# Patient Record
Sex: Female | Born: 1955 | Race: White | Hispanic: No | Marital: Married | Smoking: Never smoker
Health system: Southern US, Community
[De-identification: ages and names within clinical notes are randomized; demographics above are authoritative.]

## PROBLEM LIST (undated history)

## (undated) DIAGNOSIS — I10 Essential (primary) hypertension: Secondary | ICD-10-CM

## (undated) DIAGNOSIS — C801 Malignant (primary) neoplasm, unspecified: Secondary | ICD-10-CM

## (undated) DIAGNOSIS — Z87442 Personal history of urinary calculi: Secondary | ICD-10-CM

## (undated) DIAGNOSIS — G629 Polyneuropathy, unspecified: Secondary | ICD-10-CM

## (undated) DIAGNOSIS — K6289 Other specified diseases of anus and rectum: Secondary | ICD-10-CM

## (undated) HISTORY — DX: Essential (primary) hypertension: I10

## (undated) HISTORY — DX: Other specified diseases of anus and rectum: K62.89

## (undated) HISTORY — PX: OTHER SURGICAL HISTORY: SHX169

## (undated) HISTORY — PX: TUBAL LIGATION: SHX77

## (undated) HISTORY — PX: TONSILLECTOMY: SUR1361

## (undated) HISTORY — PX: THYROIDECTOMY: SHX17

---

## 2018-05-18 ENCOUNTER — Telehealth (INDEPENDENT_AMBULATORY_CARE_PROVIDER_SITE_OTHER): Payer: Self-pay | Admitting: *Deleted

## 2018-05-18 ENCOUNTER — Encounter (INDEPENDENT_AMBULATORY_CARE_PROVIDER_SITE_OTHER): Payer: Self-pay | Admitting: Internal Medicine

## 2018-05-18 ENCOUNTER — Encounter (INDEPENDENT_AMBULATORY_CARE_PROVIDER_SITE_OTHER): Payer: Self-pay | Admitting: *Deleted

## 2018-05-18 ENCOUNTER — Ambulatory Visit (INDEPENDENT_AMBULATORY_CARE_PROVIDER_SITE_OTHER): Payer: BLUE CROSS/BLUE SHIELD | Admitting: Internal Medicine

## 2018-05-18 VITALS — BP 150/78 | HR 80 | Temp 98.5°F | Ht 61.0 in | Wt 121.3 lb

## 2018-05-18 DIAGNOSIS — K6289 Other specified diseases of anus and rectum: Secondary | ICD-10-CM

## 2018-05-18 DIAGNOSIS — K625 Hemorrhage of anus and rectum: Secondary | ICD-10-CM | POA: Diagnosis not present

## 2018-05-18 DIAGNOSIS — K629 Disease of anus and rectum, unspecified: Secondary | ICD-10-CM

## 2018-05-18 HISTORY — DX: Other specified diseases of anus and rectum: K62.89

## 2018-05-18 LAB — CBC WITH DIFFERENTIAL/PLATELET
BASOS ABS: 80 {cells}/uL (ref 0–200)
BASOS PCT: 1 %
EOS PCT: 2 %
Eosinophils Absolute: 160 cells/uL (ref 15–500)
HEMATOCRIT: 39.1 % (ref 35.0–45.0)
HEMOGLOBIN: 13.3 g/dL (ref 11.7–15.5)
LYMPHS ABS: 2384 {cells}/uL (ref 850–3900)
MCH: 29.4 pg (ref 27.0–33.0)
MCHC: 34 g/dL (ref 32.0–36.0)
MCV: 86.3 fL (ref 80.0–100.0)
MPV: 10.8 fL (ref 7.5–12.5)
Monocytes Relative: 7.3 %
NEUTROS ABS: 4792 {cells}/uL (ref 1500–7800)
Neutrophils Relative %: 59.9 %
Platelets: 303 10*3/uL (ref 140–400)
RBC: 4.53 10*6/uL (ref 3.80–5.10)
RDW: 12.1 % (ref 11.0–15.0)
Total Lymphocyte: 29.8 %
WBC mixed population: 584 cells/uL (ref 200–950)
WBC: 8 10*3/uL (ref 3.8–10.8)

## 2018-05-18 LAB — COMPREHENSIVE METABOLIC PANEL
AG RATIO: 1.9 (calc) (ref 1.0–2.5)
ALBUMIN MSPROF: 4.6 g/dL (ref 3.6–5.1)
ALT: 17 U/L (ref 6–29)
AST: 23 U/L (ref 10–35)
Alkaline phosphatase (APISO): 79 U/L (ref 33–130)
BILIRUBIN TOTAL: 0.6 mg/dL (ref 0.2–1.2)
BUN: 12 mg/dL (ref 7–25)
CHLORIDE: 101 mmol/L (ref 98–110)
CO2: 33 mmol/L — AB (ref 20–32)
Calcium: 9.8 mg/dL (ref 8.6–10.4)
Creat: 0.95 mg/dL (ref 0.50–0.99)
GLOBULIN: 2.4 g/dL (ref 1.9–3.7)
Glucose, Bld: 111 mg/dL (ref 65–139)
POTASSIUM: 3.7 mmol/L (ref 3.5–5.3)
SODIUM: 141 mmol/L (ref 135–146)
TOTAL PROTEIN: 7 g/dL (ref 6.1–8.1)

## 2018-05-18 MED ORDER — PEG 3350-KCL-NA BICARB-NACL 420 G PO SOLR: 4000.0000 mL | Freq: Once | ORAL | 0 refills | Status: AC

## 2018-05-18 NOTE — Patient Instructions (Signed)
The risks of bleeding, perforation and infection were reviewed with patient.  

## 2018-05-18 NOTE — Progress Notes (Addendum)
   Subjective:    Patient ID: Margaret Castaneda, female    DOB: Jul 26, 1956, 62 y.o.   MRN: [id number]  HPI Referred by  Dr. Hoyt Koch for rectal mass/rectal bleeding. Seen in her OV Monday with c/o rectal bleeding and rectal pain. On rectal exam she was noted to have a rectal mass. No bleeding since Friday night. She says the bleeding was massive 5 days ago. Hx of Thrombosed hemorrhoids 5 yrs ago and this was clipped. Grandfather had ? Stomach or colon cancer in his 32s. No NSAIDs. She has had symptoms for about a month. Rectal pain.  Using the Anusol cream  Her last colonoscopy was in August of 2018 for screening. Normal.  Cecum well visualized. No polypoid disease, colitis, or sigmoid diverticulosis. No NSAIDs.   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  Review of Systems Past Medical History:  Diagnosis Date  . Hypertension   . Rectal mass 05/18/2018    History reviewed. No pertinent surgical history.  No Known Allergies  Current Outpatient Medications on File Prior to Visit  Medication Sig Dispense Refill  . amLODipine (NORVASC) 2.5 MG tablet Take 2.5 mg by mouth. Three times a week    . benazepril (LOTENSIN) 10 MG tablet Take 10 mg by mouth daily.    . hydrochlorothiazide (MICROZIDE) 12.5 MG capsule Take 12.5 mg by mouth daily.     No current facility-administered medications on file prior to visit.         Objective:   Physical Exam Blood pressure (!) 150/78, pulse 80, temperature 98.5 F (36.9 C), height 5\' 1"  (1.549 m), weight 121 lb 4.8 oz (55 kg).  Alert and oriented. Skin warm and dry. Oral mucosa is moist.   . Sclera anicteric, conjunctivae is pink. Thyroid not enlarged. No cervical lymphadenopathy. Lungs clear. Heart rate slightly irregular.  Abdomen is soft. Bowel sounds are positive. No hepatomegaly. No abdominal masses felt. No tenderness.  No edema to lower extremities.  Rectal exam: hard small mass felt 9 'oclock. Guaiac positive.        Assessment & Plan:  Rectal mass, Guaiac positive. Colonic neoplasm needs to be ruled out. Colonic polyp, hemorrhoid also in the differential.

## 2018-05-18 NOTE — Telephone Encounter (Signed)
agree

## 2018-05-24 ENCOUNTER — Other Ambulatory Visit: Payer: Self-pay

## 2018-05-24 ENCOUNTER — Encounter (HOSPITAL_COMMUNITY)
Admission: RE | Disposition: A | Discharge status: Home / Self Care | Payer: Self-pay | Source: Ambulatory Visit | Attending: Internal Medicine

## 2018-05-24 ENCOUNTER — Encounter (HOSPITAL_COMMUNITY): Payer: Self-pay | Admitting: *Deleted

## 2018-05-24 ENCOUNTER — Ambulatory Visit (HOSPITAL_COMMUNITY)
Admission: RE | Admit: 2018-05-24 | Discharge: 2018-05-24 | Disposition: A | Discharge status: Home / Self Care | Payer: BLUE CROSS/BLUE SHIELD | Source: Ambulatory Visit | Attending: Internal Medicine | Admitting: Internal Medicine

## 2018-05-24 DIAGNOSIS — K644 Residual hemorrhoidal skin tags: Secondary | ICD-10-CM | POA: Diagnosis not present

## 2018-05-24 DIAGNOSIS — Z79899 Other long term (current) drug therapy: Secondary | ICD-10-CM | POA: Insufficient documentation

## 2018-05-24 DIAGNOSIS — K625 Hemorrhage of anus and rectum: Secondary | ICD-10-CM | POA: Insufficient documentation

## 2018-05-24 DIAGNOSIS — C20 Malignant neoplasm of rectum: Secondary | ICD-10-CM | POA: Diagnosis not present

## 2018-05-24 DIAGNOSIS — Z800 Family history of malignant neoplasm of digestive organs: Secondary | ICD-10-CM | POA: Diagnosis not present

## 2018-05-24 DIAGNOSIS — K6289 Other specified diseases of anus and rectum: Secondary | ICD-10-CM | POA: Diagnosis not present

## 2018-05-24 DIAGNOSIS — I10 Essential (primary) hypertension: Secondary | ICD-10-CM | POA: Diagnosis not present

## 2018-05-24 HISTORY — PX: COLONOSCOPY: SHX5424

## 2018-05-24 SURGERY — COLONOSCOPY
Anesthesia: Moderate Sedation

## 2018-05-24 MED ORDER — MEPERIDINE HCL 50 MG/ML IJ SOLN
INTRAMUSCULAR | Status: DC | PRN
  Administered 2018-05-24 (×2): 25 mg via INTRAVENOUS

## 2018-05-24 MED ORDER — MIDAZOLAM HCL 5 MG/5ML IJ SOLN
INTRAMUSCULAR | Status: DC | PRN
  Administered 2018-05-24 (×2): 1 mg via INTRAVENOUS
  Administered 2018-05-24 (×2): 2 mg via INTRAVENOUS

## 2018-05-24 MED ORDER — SODIUM CHLORIDE 0.9 % IV SOLN
INTRAVENOUS | Status: DC
  Administered 2018-05-24: 07:00:00 via INTRAVENOUS

## 2018-05-24 MED ORDER — MEPERIDINE HCL 50 MG/ML IJ SOLN
INTRAMUSCULAR | Status: AC
  Filled 2018-05-24: qty 1

## 2018-05-24 MED ORDER — ACETAMINOPHEN 500 MG PO TABS: 500.0000 mg | ORAL_TABLET | Freq: Four times a day (QID) | ORAL | 0 refills | Status: AC | PRN

## 2018-05-24 MED ORDER — MIDAZOLAM HCL 5 MG/5ML IJ SOLN
INTRAMUSCULAR | Status: AC
  Filled 2018-05-24: qty 10

## 2018-05-24 NOTE — Discharge Instructions (Signed)
No aspirin or NSAIDs for 24 hours. Resume usual medications and diet as before. No driving for 24 hours. Patient will call with biopsy results and further recommendations.   Colonoscopy, Adult, Care After This sheet gives you information about how to care for yourself after your procedure. Your doctor may also give you more specific instructions. If you have problems or questions, call your doctor. Follow these instructions at home: General instructions   For the first 24 hours after the procedure: ? Do not drive or use machinery. ? Do not sign important documents. ? Do not drink alcohol. ? Do your daily activities more slowly than normal. ? Eat foods that are soft and easy to digest. ? Rest often.  Take over-the-counter or prescription medicines only as told by your doctor.  It is up to you to get the results of your procedure. Ask your doctor, or the department performing the procedure, when your results will be ready. To help cramping and bloating:  Try walking around.  Put heat on your belly (abdomen) as told by your doctor. Use a heat source that your doctor recommends, such as a moist heat pack or a heating pad. ? Put a towel between your skin and the heat source. ? Leave the heat on for 20-30 minutes. ? Remove the heat if your skin turns bright red. This is especially important if you cannot feel pain, heat, or cold. You can get burned. Eating and drinking  Drink enough fluid to keep your pee (urine) clear or pale yellow.  Return to your normal diet as told by your doctor. Avoid heavy or fried foods that are hard to digest.  Avoid drinking alcohol for as long as told by your doctor. Contact a doctor if:  You have blood in your poop (stool) 2-3 days after the procedure. Get help right away if:  You have more than a small amount of blood in your poop.  You see large clumps of tissue (blood clots) in your poop.  Your belly is swollen.  You feel sick to your  stomach (nauseous).  You throw up (vomit).  You have a fever.  You have belly pain that gets worse, and medicine does not help your pain. This information is not intended to replace advice given to you by your health care provider. Make sure you discuss any questions you have with your health care provider. Document Released: 01/16/2011 Document Revised: 09/07/2016 Document Reviewed: 09/07/2016 Elsevier Interactive Patient Education  2017 [employer].

## 2018-05-24 NOTE — Op Note (Signed)
[facility] Patient Name: Margaret Castaneda Procedure Date: 05/24/2018 7:36 AM MRN: [id number] Date of Birth: 1956/11/08 Attending MD: Hildred Laser , MD CSN: [id number] Age: 62 Admit Type: Outpatient Procedure:                Colonoscopy Indications:              Rectal bleeding, Rectal mass Providers:                Hildred Laser, MD, Janeece Riggers, RN, Aram Candela Referring MD:             Hoyt Koch, MD Medicines:                Meperidine 50 mg IV, Midazolam 6 mg IV Complications:            No immediate complications. Estimated Blood Loss:     Estimated blood loss was minimal. Procedure:                Pre-Anesthesia Assessment:                           - Prior to the procedure, a History and Physical                            was performed, and patient medications and                            allergies were reviewed. The patient's tolerance of                            previous anesthesia was also reviewed. The risks                            and benefits of the procedure and the sedation                            options and risks were discussed with the patient.                            All questions were answered, and informed consent                            was obtained. Prior Anticoagulants: The patient has                            taken no previous anticoagulant or antiplatelet                            agents. ASA Grade Assessment: II - A patient with                            mild systemic disease. After reviewing the risks                            and benefits, the patient was deemed in  satisfactory condition to undergo the procedure.                           After obtaining informed consent, the colonoscope                            was passed under direct vision. Throughout the                            procedure, the patient's blood pressure, pulse, and                            oxygen saturations were monitored  continuously. The                            [id number] ([id number]) scope was introduced through                            the anus and advanced to the the cecum, identified                            by appendiceal orifice and ileocecal valve. The                            colonoscopy was somewhat difficult. Successful                            completion of the procedure was aided by changing                            the patient to a supine position and using manual                            pressure. The patient tolerated the procedure well.                            The quality of the bowel preparation was excellent.                            The ileocecal valve, appendiceal orifice, and                            rectum were photographed. Scope In: 8:11:24 AM Scope Out: 8:33:34 AM Scope Withdrawal Time: 0 hours 10 minutes 40 seconds  Total Procedure Duration: 0 hours 22 minutes 10 seconds  Findings:      The perianal examination was normal.      The digital rectal exam revealed a firm and fixed rectal mass palpated       on digital exam. The mass was non-circumferential and located       predominantly at the anterior bowel wall.      The colon (entire examined portion) appeared normal.      A polypoid non-obstructing small mass was found in the distal rectum.       The mass was non-circumferential. The  mass measured two cm in length. In       addition, its diameter measured two cm. No bleeding was present.       Biopsies were taken with a cold forceps for histology. The pathology       specimen was placed into Bottle Number 1.      External hemorrhoids were found during retroflexion. The hemorrhoids       were small. Impression:               - The entire examined colon is normal.                           - Likely malignant tumor in the distal rectum. It                            measures about 2 x 2 cm adjacent to dentate line.                            Biopsied.                            - External hemorrhoids. Moderate Sedation:      Moderate (conscious) sedation was administered by the endoscopy nurse       and supervised by the endoscopist. The following parameters were       monitored: oxygen saturation, heart rate, blood pressure, CO2       capnography and response to care. Total physician intraservice time was       27 minutes. Recommendation:           - Patient has a contact number available for                            emergencies. The signs and symptoms of potential                            delayed complications were discussed with the                            patient. Return to normal activities tomorrow.                            Written discharge instructions were provided to the                            patient.                           - Resume previous diet today.                           - Continue present medications.                           - No aspirin, ibuprofen, naproxen, or other                            non-steroidal anti-inflammatory drugs  for 1 day.                           - Await pathology results.                           - Repeat colonoscopy after studies are complete. Procedure Code(s):        --- Professional ---                           [phone], Colonoscopy, flexible; with biopsy, single                            or multiple                           G0500, Moderate sedation services provided by the                            same physician or other qualified health care                            professional performing a gastrointestinal                            endoscopic service that sedation supports,                            requiring the presence of an independent trained                            observer to assist in the monitoring of the                            patient's level of consciousness and physiological                            status; initial 15 minutes of intra-service  time;                            patient age 21 years or older (additional time may                            be reported with [phone], as appropriate)                           [phone], Moderate sedation services provided by the                            same physician or other qualified health care                            professional performing the diagnostic or  therapeutic service that the sedation supports,                            requiring the presence of an independent trained                            observer to assist in the monitoring of the                            patient's level of consciousness and physiological                            status; each additional 15 minutes intraservice                            time (List separately in addition to code for                            primary service) Diagnosis Code(s):        --- Professional ---                           K62.89, Other specified diseases of anus and rectum                           K64.4, Residual hemorrhoidal skin tags                           D49.0, Neoplasm of unspecified behavior of                            digestive system                           K62.5, Hemorrhage of anus and rectum CPT copyright 2017 American Medical Association. All rights reserved. The codes documented in this report are preliminary and upon coder review may  be revised to meet current compliance requirements. Hildred Laser, MD Hildred Laser, MD 05/24/2018 8:49:59 AM This report has been signed electronically. Number of Addenda: 0

## 2018-05-24 NOTE — H&P (Signed)
Margaret Castaneda is an 62 y.o. female.   Chief Complaint: Patient is here for colonoscopy. HPI: Patient is 62 year old Caucasian female whose last colonoscopy was about 5 years ago and was normal who has noted pressure sensation in her rectum few weeks.  She also had an episode of rectal bleeding one weeks ago.  She was seen by her gynecologist Dr. Caren Griffins Heist last week noted to have rectal mass.  She was therefore referred to our office for further evaluation.  She denies abdominal pain anorexia or weight loss.  She also denies change in her bowel habits. Family history is negative for CRC.  Rectal bleeding  Past Medical History:  Diagnosis Date  . Hypertension   . Rectal mass 05/18/2018    Past Surgical History:  Procedure Laterality Date  . reversal tubal ligation    . TONSILLECTOMY    . TUBAL LIGATION      Family History  Problem Relation Age of Onset  . Gastric cancer Paternal Grandfather   . Colon cancer Neg Hx    Social History:  reports that she has never smoked. She has never used smokeless tobacco. She reports that she does not drink alcohol or use drugs.  Allergies: No Known Allergies  Medications Prior to Admission  Medication Sig Dispense Refill  . acetaminophen (TYLENOL) 500 MG tablet Take 500 mg by mouth every 6 (six) hours as needed for moderate pain or headache.    Marland Kitchen amLODipine (NORVASC) 2.5 MG tablet Take 2.5 mg by mouth every Monday, Wednesday, and Friday.     . benazepril (LOTENSIN) 10 MG tablet Take 10 mg by mouth daily.    . hydrochlorothiazide (MICROZIDE) 12.5 MG capsule Take 12.5 mg by mouth daily.    Marland Kitchen ibuprofen (ADVIL,MOTRIN) 200 MG tablet Take 200 mg by mouth every 6 (six) hours as needed for headache or moderate pain.    Marland Kitchen PROCTOZONE-HC 2.5 % rectal cream Place 1 application rectally 2 (two) times daily as needed for hemorrhoids or anal itching.   0    No results found for this or any previous visit (from the past 48 hour(s)). No results  found.  ROS  Blood pressure (!) 150/92, pulse 73, temperature 98.1 F (36.7 C), temperature source Oral, resp. rate 17, height 5\' 1"  (1.549 m), weight 121 lb (54.9 kg), SpO2 100 %. Physical Exam  Constitutional: She appears well-developed and well-nourished.  HENT:  Mouth/Throat: Oropharynx is clear and moist.  Eyes: Conjunctivae are normal. No scleral icterus.  Neck: No thyromegaly present.  Cardiovascular: Normal rate, regular rhythm and normal heart sounds.  No murmur heard. Respiratory: Effort normal and breath sounds normal.  GI: Soft. She exhibits no distension and no mass. There is no tenderness.  Pfannenstiel scar.  Musculoskeletal: She exhibits no edema.  Lymphadenopathy:    She has no cervical adenopathy.  Neurological: She is alert.  Skin: Skin is warm and dry.     Assessment/Plan Rectal bleeding and abnormal rectal exam. Diagnostic colonoscopy.  Hildred Laser, MD 05/24/2018, 8:00 AM

## 2018-05-26 ENCOUNTER — Encounter (HOSPITAL_COMMUNITY): Payer: Self-pay | Admitting: Internal Medicine

## 2018-05-26 ENCOUNTER — Other Ambulatory Visit (INDEPENDENT_AMBULATORY_CARE_PROVIDER_SITE_OTHER): Payer: Self-pay | Admitting: Internal Medicine

## 2018-05-26 DIAGNOSIS — C20 Malignant neoplasm of rectum: Secondary | ICD-10-CM

## 2018-05-30 ENCOUNTER — Ambulatory Visit (HOSPITAL_COMMUNITY)
Admission: RE | Admit: 2018-05-30 | Discharge: 2018-05-30 | Disposition: A | Discharge status: Home / Self Care | Payer: BLUE CROSS/BLUE SHIELD | Source: Ambulatory Visit | Attending: Internal Medicine | Admitting: Internal Medicine

## 2018-05-30 DIAGNOSIS — C20 Malignant neoplasm of rectum: Secondary | ICD-10-CM | POA: Diagnosis present

## 2018-05-30 DIAGNOSIS — N6311 Unspecified lump in the right breast, upper outer quadrant: Secondary | ICD-10-CM | POA: Diagnosis not present

## 2018-05-30 MED ORDER — IOPAMIDOL (ISOVUE-300) INJECTION 61%
100.0000 mL | Freq: Once | INTRAVENOUS | Status: AC | PRN
  Administered 2018-05-30: 100 mL via INTRAVENOUS

## 2018-06-02 ENCOUNTER — Other Ambulatory Visit: Payer: Self-pay

## 2018-06-02 ENCOUNTER — Encounter (HOSPITAL_COMMUNITY): Payer: Self-pay | Admitting: Hematology

## 2018-06-02 ENCOUNTER — Inpatient Hospital Stay (HOSPITAL_COMMUNITY): Payer: BLUE CROSS/BLUE SHIELD | Attending: Hematology | Admitting: Hematology

## 2018-06-02 DIAGNOSIS — Z8051 Family history of malignant neoplasm of kidney: Secondary | ICD-10-CM

## 2018-06-02 DIAGNOSIS — Z800 Family history of malignant neoplasm of digestive organs: Secondary | ICD-10-CM | POA: Insufficient documentation

## 2018-06-02 DIAGNOSIS — Z808 Family history of malignant neoplasm of other organs or systems: Secondary | ICD-10-CM

## 2018-06-02 DIAGNOSIS — N6310 Unspecified lump in the right breast, unspecified quadrant: Secondary | ICD-10-CM | POA: Insufficient documentation

## 2018-06-02 DIAGNOSIS — N63 Unspecified lump in unspecified breast: Secondary | ICD-10-CM | POA: Diagnosis not present

## 2018-06-02 DIAGNOSIS — C211 Malignant neoplasm of anal canal: Secondary | ICD-10-CM | POA: Insufficient documentation

## 2018-06-02 DIAGNOSIS — Z803 Family history of malignant neoplasm of breast: Secondary | ICD-10-CM | POA: Diagnosis not present

## 2018-06-02 DIAGNOSIS — Z85828 Personal history of other malignant neoplasm of skin: Secondary | ICD-10-CM | POA: Insufficient documentation

## 2018-06-02 NOTE — Assessment & Plan Note (Addendum)
1.  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  No inguinal lymphadenopathy palpable. -We discussed the results of CT scan of the chest, abdomen and pelvis which did not show any evidence of distant metastatic disease.  There is a very tiny questionable perirectal lymph node.  Not pathologically enlarged.  I have recommended doing a PET CT scan to complete the work-up. - We went over the management of locoregional anal canal cancer with combination chemoradiation therapy.  We will use  Xeloda 825 mg/m2 p.o. twice daily, Monday through Friday, on each day that RT is given, throughout the duration of RT.  Mitomycin will be given at a dose of 10 mg/m2 on day 1 and day 29. -We will make a referral to [facility] in [place] for radiation consultation. -Will see her back after the PET CT scan to discuss results and for chemo education.  2.  Right breast lump: - CT scan showed incidental right breast lump.  Patient reports that she is aware of this for the last 25 years.  A needle biopsy at that time was benign.  Serial mammograms did not show any growth.

## 2018-06-02 NOTE — Progress Notes (Signed)
[facility] NOTE  Patient Care Team: Kennieth Rad, MD as PCP - General (Internal Medicine)  CHIEF COMPLAINTS/PURPOSE OF CONSULTATION:  Squamous cell carcinoma of the anal canal.  HISTORY OF PRESENTING ILLNESS:  Margaret Castaneda 62 y.o. female is seen in consultation today for newly diagnosed squamous cell carcinoma of the anal canal.  She was having rectal pain for the last 8 to 10 weeks on and off.  She has used Preparation H thinking that it was coming from hemorrhoids.  She did see her GYN doctor who did a rectal exam and found some abnormality.  After that she started having severe rectal pain and bleeding.  She was evaluated by Dr.Rehman, and underwent a colonoscopy on 05/24/2018.  A polypoid nonobstructing, non-circumferential 2 x 2 centimeter mass found in the distal rectum.  Rest of the colonoscopy was negative.  Bleeding and pain has improved after colonoscopy.  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  No other malignancies reported.  She had a right breast lump for 25 years which has not changed on serial mammograms.  This was biopsied 25 years ago and was benign.  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  Otherwise she is very healthy.  She is accompanied by her husband and her daughter today. Family history significant for father having kidney cancer, paternal grandfather having stomach cancer.  Sister had thyroid cancer and maternal grandmother had breast cancer at age 43.  MEDICAL HISTORY:  Past Medical History:  Diagnosis Date  . Hypertension   . Rectal mass 05/18/2018    SURGICAL HISTORY: Past Surgical History:  Procedure Laterality Date  . COLONOSCOPY N/A 05/24/2018   Procedure: COLONOSCOPY;  Surgeon: Rogene Houston, MD;  Location: [facility];  Service: Endoscopy;  Laterality: N/A;  7:30  . reversal tubal ligation    . TONSILLECTOMY    . TUBAL LIGATION      SOCIAL HISTORY: Social History    Socioeconomic History  . Marital status: Married    Spouse name: Not on file  . Number of children: Not on file  . Years of education: Not on file  . Highest education level: Not on file  Occupational History  . Not on file  Social Needs  . Financial resource strain: Not on file  . Food insecurity:    Worry: Not on file    Inability: Not on file  . Transportation needs:    Medical: Not on file    Non-medical: Not on file  Tobacco Use  . Smoking status: Never Smoker  . Smokeless tobacco: Never Used  Substance and Sexual Activity  . Alcohol use: Never    Frequency: Never  . Drug use: Never  . Sexual activity: Not on file  Lifestyle  . Physical activity:    Days per week: Not on file    Minutes per session: Not on file  . Stress: Not on file  Relationships  . Social connections:    Talks on phone: Not on file    Gets together: Not on file    Attends religious service: Not on file    Active member of club or organization: Not on file    Attends meetings of clubs or organizations: Not on file    Relationship status: Not on file  . Intimate partner violence:    Fear of current or ex partner: Not on file    Emotionally abused: Not on  file    Physically abused: Not on file    Forced sexual activity: Not on file  Other Topics Concern  . Not on file  Social History Narrative  . Not on file    FAMILY HISTORY: Family History  Problem Relation Age of Onset  . Gastric cancer Paternal Grandfather   . Hypertension Mother   . Kidney cancer Father   . Stroke Father   . Heart attack Father   . Thyroid cancer Sister   . Hypertension Brother   . Breast cancer Maternal Grandmother   . Colon cancer Neg Hx     ALLERGIES:  has No Known Allergies.  MEDICATIONS:  Current Outpatient Medications  Medication Sig Dispense Refill  . acetaminophen (TYLENOL) 500 MG tablet Take 1 tablet (500 mg total) by mouth every 6 (six) hours as needed for moderate pain or headache. 30 tablet  0  . amLODipine (NORVASC) 2.5 MG tablet Take 2.5 mg by mouth every Monday, Wednesday, and Friday.     . benazepril (LOTENSIN) 10 MG tablet Take 10 mg by mouth daily.    . hydrochlorothiazide (MICROZIDE) 12.5 MG capsule Take 12.5 mg by mouth daily.    Marland Kitchen ibuprofen (ADVIL,MOTRIN) 200 MG tablet Take 200 mg by mouth every 6 (six) hours as needed for headache or moderate pain.    Marland Kitchen PROCTOZONE-HC 2.5 % rectal cream Place 1 application rectally 2 (two) times daily as needed for hemorrhoids or anal itching.   0   No current facility-administered medications for this visit.     REVIEW OF SYSTEMS:   Constitutional: Denies fevers, chills or abnormal night sweats Eyes: Denies blurriness of vision, double vision or watery eyes Ears, nose, mouth, throat, and face: Denies mucositis or sore throat Respiratory: Denies cough, dyspnea or wheezes Cardiovascular: Denies palpitation, chest discomfort or lower extremity swelling Gastrointestinal:  Denies nausea, heartburn or change in bowel habits.  Occasional rectal pain present. Skin: Denies abnormal skin rashes Lymphatics: Denies new lymphadenopathy or easy bruising Neurological:Denies numbness, tingling or new weaknesses Behavioral/Psych: Mood is stable, no new changes  All other systems were reviewed with the patient and are negative.  PHYSICAL EXAMINATION: ECOG PERFORMANCE STATUS: 0 - Asymptomatic  Vitals:   06/02/18 1302  BP: (!) 160/78  Pulse: 94  Resp: 16  Temp: 98.6 F (37 C)  SpO2: 99%   Filed Weights   06/02/18 1302  Weight: 122 lb 6.4 oz (55.5 kg)    GENERAL:alert, no distress and comfortable SKIN: skin color, texture, turgor are normal, no rashes or significant lesions EYES: normal, conjunctiva are pink and non-injected, sclera clear OROPHARYNX:no exudate, no erythema and lips, buccal mucosa, and tongue normal  NECK: supple, thyroid normal size, non-tender, without nodularity LYMPH:  no palpable lymphadenopathy in the cervical,  axillary or inguinal LUNGS: clear to auscultation and percussion with normal breathing effort HEART: regular rate & rhythm and no murmurs and no lower extremity edema ABDOMEN:abdomen soft, non-tender and normal bowel sounds Musculoskeletal:no cyanosis of digits and no clubbing  PSYCH: alert & oriented x 3 with fluent speech Rectal examination reveals a 2 to 3 cm mass in the anterior wall of the anal canal.  No bleeding was noted.  LABORATORY DATA:  I have reviewed the data as listed Lab Results  Component Value Date   WBC 8.0 05/18/2018   HGB 13.3 05/18/2018   HCT 39.1 05/18/2018   MCV 86.3 05/18/2018   PLT 303 05/18/2018     Chemistry      Component  Value Date/Time   NA 141 05/18/2018 1416   K 3.7 05/18/2018 1416   CL 101 05/18/2018 1416   CO2 33 (H) 05/18/2018 1416   BUN 12 05/18/2018 1416   CREATININE 0.95 05/18/2018 1416      Component Value Date/Time   CALCIUM 9.8 05/18/2018 1416   AST 23 05/18/2018 1416   ALT 17 05/18/2018 1416   BILITOT 0.6 05/18/2018 1416       RADIOGRAPHIC STUDIES: I have personally reviewed the radiological images as listed and agreed with the findings in the report. Ct Chest W Contrast  Result Date: 05/31/2018 CLINICAL DATA:  Rectal cancer staging. EXAM: CT CHEST, ABDOMEN, AND PELVIS WITH CONTRAST TECHNIQUE: Multidetector CT imaging of the chest, abdomen and pelvis was performed following the standard protocol during bolus administration of intravenous contrast. CONTRAST:  124mL ISOVUE-300 IOPAMIDOL (ISOVUE-300) INJECTION 61% COMPARISON:  None. FINDINGS: CT CHEST FINDINGS Cardiovascular: The heart size is normal.  No pericardial effusion. Mediastinum/Nodes: No mediastinal lymphadenopathy. There is no hilar lymphadenopathy. The esophagus has normal imaging features. There is no axillary lymphadenopathy. Lungs/Pleura: The central tracheobronchial airways are patent. No suspicious pulmonary nodule or mass. No focal airspace consolidation. No pulmonary  edema or pleural effusion. Musculoskeletal: 1.9 x 2.1 cm soft tissue lesion identified in the upper outer quadrant of the right breast. Bone windows reveal no worrisome lytic or sclerotic osseous lesions. CT ABDOMEN PELVIS FINDINGS Hepatobiliary: Scattered tiny well-defined hypoattenuating lesions in the liver parenchyma range in size from several mm up to a dominant 10 mm lesion in the posterior right liver (image 61/series 2). Larger lesions suggest cysts and tiny lesions are too small to characterize but likely benign. There is no evidence for gallstones, gallbladder wall thickening, or pericholecystic fluid. No intrahepatic or extrahepatic biliary dilation. Pancreas: No focal mass lesion. No dilatation of the main duct. No intraparenchymal cyst. No peripancreatic edema. Spleen: No splenomegaly. No focal mass lesion. Adrenals/Urinary Tract: No adrenal nodule or mass. Kidneys are normal in appearance. No evidence for hydroureter. The urinary bladder appears normal for the degree of distention. Stomach/Bowel: Stomach is distended with food and contrast material. Duodenum is normally positioned as is the ligament of Treitz. No small bowel wall thickening. No small bowel dilatation. The terminal ileum is normal. The appendix is normal. Descending colon is positioned more medially than typically seen. Subtle non circumferential wall thickening is seen anteriorly into the left of the distal rectum. Vascular/Lymphatic: No abdominal aortic aneurysm. No abdominal aortic atherosclerotic calcification. Portal vein, superior mesenteric vein, and splenic vein are patent although the splenic vein appears narrowed just proximal to the portal splenic confluence. There is no gastrohepatic or hepatoduodenal ligament lymphadenopathy. No intraperitoneal or retroperitoneal lymphadenopathy. No lymphadenopathy along the common or internal iliac chains by size criteria. No pelvic sidewall lymphadenopathy. Possible very tiny right  perirectal lymph node seen adjacent to the rectum (image 103/2). Reproductive: The uterus has normal CT imaging appearance. There is no adnexal mass. Other: No intraperitoneal free fluid. Musculoskeletal: Bone windows reveal no worrisome lytic or sclerotic osseous lesions. IMPRESSION: 1. No definite findings of metastatic disease in the chest, abdomen, or pelvis. Possible very tiny lymph node identified just to the right of the rectum. Subtle wall thickening in the distal rectum may reflect the lesion seen at colonoscopy. 2. 1.9 x 2.1 cm soft tissue lesion in the upper outer quadrant of the right breast. Correlation with mammographic screening history recommended. Electronically Signed   By: Misty Stanley M.D.   On: 05/31/2018 08:10  Ct Abdomen Pelvis W Contrast  Result Date: 05/31/2018 CLINICAL DATA:  Rectal cancer staging. EXAM: CT CHEST, ABDOMEN, AND PELVIS WITH CONTRAST TECHNIQUE: Multidetector CT imaging of the chest, abdomen and pelvis was performed following the standard protocol during bolus administration of intravenous contrast. CONTRAST:  171mL ISOVUE-300 IOPAMIDOL (ISOVUE-300) INJECTION 61% COMPARISON:  None. FINDINGS: CT CHEST FINDINGS Cardiovascular: The heart size is normal.  No pericardial effusion. Mediastinum/Nodes: No mediastinal lymphadenopathy. There is no hilar lymphadenopathy. The esophagus has normal imaging features. There is no axillary lymphadenopathy. Lungs/Pleura: The central tracheobronchial airways are patent. No suspicious pulmonary nodule or mass. No focal airspace consolidation. No pulmonary edema or pleural effusion. Musculoskeletal: 1.9 x 2.1 cm soft tissue lesion identified in the upper outer quadrant of the right breast. Bone windows reveal no worrisome lytic or sclerotic osseous lesions. CT ABDOMEN PELVIS FINDINGS Hepatobiliary: Scattered tiny well-defined hypoattenuating lesions in the liver parenchyma range in size from several mm up to a dominant 10 mm lesion in the  posterior right liver (image 61/series 2). Larger lesions suggest cysts and tiny lesions are too small to characterize but likely benign. There is no evidence for gallstones, gallbladder wall thickening, or pericholecystic fluid. No intrahepatic or extrahepatic biliary dilation. Pancreas: No focal mass lesion. No dilatation of the main duct. No intraparenchymal cyst. No peripancreatic edema. Spleen: No splenomegaly. No focal mass lesion. Adrenals/Urinary Tract: No adrenal nodule or mass. Kidneys are normal in appearance. No evidence for hydroureter. The urinary bladder appears normal for the degree of distention. Stomach/Bowel: Stomach is distended with food and contrast material. Duodenum is normally positioned as is the ligament of Treitz. No small bowel wall thickening. No small bowel dilatation. The terminal ileum is normal. The appendix is normal. Descending colon is positioned more medially than typically seen. Subtle non circumferential wall thickening is seen anteriorly into the left of the distal rectum. Vascular/Lymphatic: No abdominal aortic aneurysm. No abdominal aortic atherosclerotic calcification. Portal vein, superior mesenteric vein, and splenic vein are patent although the splenic vein appears narrowed just proximal to the portal splenic confluence. There is no gastrohepatic or hepatoduodenal ligament lymphadenopathy. No intraperitoneal or retroperitoneal lymphadenopathy. No lymphadenopathy along the common or internal iliac chains by size criteria. No pelvic sidewall lymphadenopathy. Possible very tiny right perirectal lymph node seen adjacent to the rectum (image 103/2). Reproductive: The uterus has normal CT imaging appearance. There is no adnexal mass. Other: No intraperitoneal free fluid. Musculoskeletal: Bone windows reveal no worrisome lytic or sclerotic osseous lesions. IMPRESSION: 1. No definite findings of metastatic disease in the chest, abdomen, or pelvis. Possible very tiny lymph node  identified just to the right of the rectum. Subtle wall thickening in the distal rectum may reflect the lesion seen at colonoscopy. 2. 1.9 x 2.1 cm soft tissue lesion in the upper outer quadrant of the right breast. Correlation with mammographic screening history recommended. Electronically Signed   By: Misty Stanley M.D.   On: 05/31/2018 08:10    ASSESSMENT & PLAN:  Primary cancer of anal canal (HCC) 1.  Stage II (T2N0) squamous cell carcinoma of the anal canal: - Patient presented with 8 to 10 weeks of rectal pain and bleeding, status post colonoscopy on 05/24/2018 showing polypoid nonobstructing, non-circumferential, approximately 2 x 2 cm small mass found in the distal rectum, biopsy consistent with invasive squamous cell carcinoma, P 16 strongly positive - Had evaluation by GYN in January, Pap smear within normal limits - Occasional rectal pain, improved after colonoscopy, rectal examination today consistent with 2  to 3 cm mass in the anterior anal canal.  No inguinal lymphadenopathy palpable. -We discussed the results of CT scan of the chest, abdomen and pelvis which did not show any evidence of distant metastatic disease.  There is a very tiny questionable perirectal lymph node.  Not pathologically enlarged.  I have recommended doing a PET CT scan to complete the work-up. - We went over the management of locoregional anal canal cancer with combination chemoradiation therapy.  We will use  Xeloda 825 mg/m2 p.o. twice daily, Monday through Friday, on each day that RT is given, throughout the duration of RT.  Mitomycin will be given at a dose of 10 mg/m2 on day 1 and day 29. -We will make a referral to [facility] in [place] for radiation consultation. -Will see her back after the PET CT scan to discuss results and for chemo education.  2.  Right breast lump: - CT scan showed incidental right breast lump.  Patient reports that she is aware of this for the last 25 years.  A needle biopsy at that  time was benign.  Serial mammograms did not show any growth.  Orders Placed This Encounter  Procedures  . NM PET Image Initial (PI) Skull Base To Thigh    Standing Status:   Future    Standing Expiration Date:   06/02/2019    Order Specific Question:   If indicated for the ordered procedure, I authorize the administration of a radiopharmaceutical per Radiology protocol    Answer:   Yes    Order Specific Question:   Preferred imaging location?    Answer:   [facility]    Order Specific Question:   Radiology Contrast Protocol - do NOT remove file path    Answer:   [email or web address]    Order Specific Question:   Reason for Exam additional comments    Answer:   Newly diagnosed anal cancer   I have called and talked to Dr. Rhunette Croft who kindly agreed to see this patient as soon as possible. All questions were answered. The patient knows to call the clinic with any problems, questions or concerns.  Time spent is 45 minutes with more than 50% of the time spent face-to-face discussing new diagnosis, scan results, pathology report, treatment plan and coordination of care.    Derek Jack, MD 06/02/2018 2:40 PM

## 2018-06-03 ENCOUNTER — Encounter (HOSPITAL_COMMUNITY): Payer: Self-pay | Admitting: Hematology

## 2018-06-03 NOTE — Progress Notes (Signed)
Appointment with Dr. Rhunette Croft 06/16/18 @ 9:30AM. Patient aware.  Records faxed to [phone].  Imaging CD's will be sent after patient's PET on 06/06/18.

## 2018-06-06 ENCOUNTER — Encounter (HOSPITAL_COMMUNITY)
Admission: RE | Admit: 2018-06-06 | Discharge: 2018-06-06 | Disposition: A | Discharge status: Home / Self Care | Payer: BLUE CROSS/BLUE SHIELD | Source: Ambulatory Visit | Attending: Hematology | Admitting: Hematology

## 2018-06-06 DIAGNOSIS — C211 Malignant neoplasm of anal canal: Secondary | ICD-10-CM | POA: Diagnosis present

## 2018-06-06 MED ORDER — FLUDEOXYGLUCOSE F - 18 (FDG) INJECTION
11.7400 | Freq: Once | INTRAVENOUS | Status: AC | PRN
  Administered 2018-06-06: 11.74 via INTRAVENOUS

## 2018-06-07 ENCOUNTER — Telehealth (HOSPITAL_COMMUNITY): Payer: Self-pay | Admitting: General Practice

## 2018-06-07 NOTE — Telephone Encounter (Signed)
[facility] Progress Notes   Psychosocial Distress Screening Clinical Social Work  Clinical Social Work was referred by distress screening protocol.  The patient scored a 2 on the Psychosocial Distress Thermometer which indicates mild distress. Clinical Social Worker contacted patient by phone to assess for distress and other psychosocial needs. CSW and patient discussed common feeling and emotions when being diagnosed with cancer, and the importance of support during treatment. CSW informed patient of the support team and support services at [facility]. CSW provided contact information and encouraged patient to call with any questions or concerns.  Patient grappling w unexpected diagnosis of cancer, has strong support from faith and family.  Discussed importance of self care during predictable times of increased anxiety, especially while in treatment planning and testing phase.  Anticipate that anxiety will decrease as treatment continues.    ONCBCN DISTRESS SCREENING 06/02/2018  Screening Type Initial Screening  Distress experienced in past week (1-10) 2    Clinical Social Worker follow up needed: No.  Patient encouraged to call back as needed, information packet sent.  If yes, follow up plan:  Beverely Pace, [place], LCSW Clinical Social Worker Phone:  [phone]

## 2018-06-08 ENCOUNTER — Encounter (HOSPITAL_COMMUNITY): Payer: Self-pay | Admitting: Hematology

## 2018-06-08 ENCOUNTER — Inpatient Hospital Stay (HOSPITAL_BASED_OUTPATIENT_CLINIC_OR_DEPARTMENT_OTHER): Payer: BLUE CROSS/BLUE SHIELD | Admitting: Hematology

## 2018-06-08 ENCOUNTER — Other Ambulatory Visit: Payer: Self-pay

## 2018-06-08 VITALS — BP 172/82 | HR 97 | Resp 16 | Wt 121.2 lb

## 2018-06-08 DIAGNOSIS — R946 Abnormal results of thyroid function studies: Secondary | ICD-10-CM | POA: Diagnosis not present

## 2018-06-08 DIAGNOSIS — C211 Malignant neoplasm of anal canal: Secondary | ICD-10-CM

## 2018-06-08 DIAGNOSIS — E041 Nontoxic single thyroid nodule: Secondary | ICD-10-CM

## 2018-06-08 MED ORDER — CAPECITABINE 500 MG PO TABS: 1000.0000 mg | ORAL_TABLET | Freq: Two times a day (BID) | ORAL | 0 refills | Status: DC

## 2018-06-08 MED ORDER — CAPECITABINE 150 MG PO TABS: 150.0000 mg | ORAL_TABLET | Freq: Two times a day (BID) | ORAL | 0 refills | Status: DC

## 2018-06-08 MED ORDER — PROCHLORPERAZINE MALEATE 10 MG PO TABS: 10.0000 mg | ORAL_TABLET | Freq: Four times a day (QID) | ORAL | 1 refills | Status: DC | PRN

## 2018-06-08 NOTE — Addendum Note (Signed)
Addended by: Derek Jack on: 06/08/2018 04:55 PM   Modules accepted: Orders

## 2018-06-08 NOTE — Progress Notes (Signed)
START OFF PATHWAY REGIMEN - Anal Carcinoma   [id number]-Fluorouracil 1,000 mg/m2/day CIV D1-4, 29-32 + Mitomycin 10 mg/m2 D1, 29 + RT:   Chemotherapy concurrent with RT:     Mitomycin      5-Fluorouracil   **Always confirm dose/schedule in your pharmacy ordering system**  Patient Characteristics: Anal and Anal Margin Tumors, Newly Diagnosed - Locoregional Disease Not Amenable to Local Excision AJCC T Category: T2 AJCC N Category: N0 AJCC M Category: M0 AJCC 8 Stage Grouping: IIA Current Disease Status: Newly Diagnosed - Locoregional Disease Not Amendable to Local Excision Intent of Therapy: Curative Intent, Discussed with Patient

## 2018-06-08 NOTE — Progress Notes (Signed)
[place], [place] [address]   CLINIC:  Medical Oncology/Hematology  PCP:  Kennieth Rad, MD [facility] [address] [place] [address] [phone]   REASON FOR VISIT:  Follow-up for anal cancer.  CURRENT THERAPY: Concurrent chemoradiation therapy.   INTERVAL HISTORY:  [name] 62 y.o. female returns for follow-up of PET scan results.  She is accompanied by her husband and daughter.  Denies any new onset pains.  Denies any history of thyroid problems.  Energy levels are 100%.  REVIEW OF SYSTEMS:  Review of Systems  All other systems reviewed and are negative.    PAST MEDICAL/SURGICAL HISTORY:  Past Medical History:  Diagnosis Date  . Hypertension   . Rectal mass 05/18/2018   Past Surgical History:  Procedure Laterality Date  . COLONOSCOPY N/A 05/24/2018   Procedure: COLONOSCOPY;  Surgeon: Rogene Houston, MD;  Location: [facility];  Service: Endoscopy;  Laterality: N/A;  7:30  . reversal tubal ligation    . TONSILLECTOMY    . TUBAL LIGATION       SOCIAL HISTORY:  Social History   Socioeconomic History  . Marital status: Married    Spouse name: Not on file  . Number of children: Not on file  . Years of education: Not on file  . Highest education level: Not on file  Occupational History  . Not on file  Social Needs  . Financial resource strain: Not on file  . Food insecurity:    Worry: Not on file    Inability: Not on file  . Transportation needs:    Medical: Not on file    Non-medical: Not on file  Tobacco Use  . Smoking status: Never Smoker  . Smokeless tobacco: Never Used  Substance and Sexual Activity  . Alcohol use: Never    Frequency: Never  . Drug use: Never  . Sexual activity: Not on file  Lifestyle  . Physical activity:    Days per week: Not on file    Minutes per session: Not on file  . Stress: Not on file  Relationships  . Social connections:    Talks on phone: Not  on file    Gets together: Not on file    Attends religious service: Not on file    Active member of club or organization: Not on file    Attends meetings of clubs or organizations: Not on file    Relationship status: Not on file  . Intimate partner violence:    Fear of current or ex partner: Not on file    Emotionally abused: Not on file    Physically abused: Not on file    Forced sexual activity: Not on file  Other Topics Concern  . Not on file  Social History Narrative  . Not on file    FAMILY HISTORY:  Family History  Problem Relation Age of Onset  . Gastric cancer Paternal Grandfather   . Hypertension Mother   . Kidney cancer Father   . Stroke Father   . Heart attack Father   . Thyroid cancer Sister   . Hypertension Brother   . Breast cancer Maternal Grandmother   . Colon cancer Neg Hx     CURRENT MEDICATIONS:  Outpatient Encounter Medications as of 06/08/2018  Medication Sig  . acetaminophen (TYLENOL) 500 MG tablet Take 1 tablet (500 mg total) by mouth every 6 (six) hours as needed for moderate pain or headache.  Marland Kitchen  amLODipine (NORVASC) 2.5 MG tablet Take 2.5 mg by mouth every Monday, Wednesday, and Friday.   . benazepril (LOTENSIN) 10 MG tablet Take 10 mg by mouth daily.  . hydrochlorothiazide (MICROZIDE) 12.5 MG capsule Take 12.5 mg by mouth daily.  Marland Kitchen ibuprofen (ADVIL,MOTRIN) 200 MG tablet Take 200 mg by mouth every 6 (six) hours as needed for headache or moderate pain.  Marland Kitchen PROCTOZONE-HC 2.5 % rectal cream Place 1 application rectally 2 (two) times daily as needed for hemorrhoids or anal itching.   . capecitabine (XELODA) 150 MG tablet Take 1 tablet (150 mg total) by mouth 2 (two) times daily after a meal. Total dose of 1150 mg twice daily.  Take along with two 500 mg Xeloda tablets.  . capecitabine (XELODA) 500 MG tablet Take 2 tablets (1,000 mg total) by mouth 2 (two) times daily after a meal.  . prochlorperazine (COMPAZINE) 10 MG tablet Take 1 tablet (10 mg total) by  mouth every 6 (six) hours as needed for nausea or vomiting.   No facility-administered encounter medications on file as of 06/08/2018.     ALLERGIES:  No Known Allergies   PHYSICAL EXAM:  ECOG Performance status: 0.  Vitals:   06/08/18 1458  BP: (!) 172/82  Pulse: 97  Resp: 16  SpO2: 100%   Filed Weights   06/08/18 1458  Weight: 121 lb 3.2 oz (55 kg)    Physical Exam Deferred.  LABORATORY DATA:  I have reviewed the labs as listed.  CBC    Component Value Date/Time   WBC 8.0 05/18/2018 1416   RBC 4.53 05/18/2018 1416   HGB 13.3 05/18/2018 1416   HCT 39.1 05/18/2018 1416   PLT 303 05/18/2018 1416   MCV 86.3 05/18/2018 1416   MCH 29.4 05/18/2018 1416   MCHC 34.0 05/18/2018 1416   RDW 12.1 05/18/2018 1416   LYMPHSABS 2,384 05/18/2018 1416   EOSABS 160 05/18/2018 1416   BASOSABS 80 05/18/2018 1416   CMP Latest Ref Rng & Units 05/18/2018  Glucose 65 - 139 mg/dL 111  BUN 7 - 25 mg/dL 12  Creatinine 0.50 - 0.99 mg/dL 0.95  Sodium 135 - 146 mmol/L 141  Potassium 3.5 - 5.3 mmol/L 3.7  Chloride 98 - 110 mmol/L 101  CO2 20 - 32 mmol/L 33(H)  Calcium 8.6 - 10.4 mg/dL 9.8  Total Protein 6.1 - 8.1 g/dL 7.0  Total Bilirubin 0.2 - 1.2 mg/dL 0.6  AST 10 - 35 U/L 23  ALT 6 - 29 U/L 17       DIAGNOSTIC IMAGING:  I have personally reviewed the images of the PET CT scan dated 06/06/2018 and agree with radiology report.     ASSESSMENT & PLAN:   Primary cancer of anal canal ([place]) 1.  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  No inguinal lymphadenopathy palpable. -We discussed the results of CT scan of the chest, abdomen and pelvis which did not show any evidence of distant metastatic disease.  There is a very tiny questionable perirectal  lymph node.  Not pathologically enlarged. - We discussed the results of the PET CT scan dated 06/06/2018 which shows a 3 cm segment uptake in the anal canal region.  No pelvic adenopathy.  There is uptake in the left lobe of the thyroid.  We will order a thyroid ultrasound for follow-up.  I have also called and talked to Dr. Suzy Bouchard, radiologist who read the PET scan.  The small area of uptake in the left pelvis was thought to be secondary to ureter. -We talked about the side effects of chemotherapy regimen containing Xeloda 825 mg/m2 twice daily Monday through Friday throughout the course of radiation.  Mitomycin will be given to 10 mg/m2 on day 1 and 829.  We discussed about the side effects in detail.  I will round of her Xeloda dose to 1150 mg twice daily.  We have sent a prescription to the specialty pharmacy.  We talked about the side effects of Xeloda in detail including but not limited to GI side effects, skin rashes, hand-foot skin reaction, cytopenias among others.  We also talked about mitomycin related side effects including renal insufficiency and severe cytopenias.  She understands and gives his permission to proceed with the treatment.  Our chemotherapy nurse have checked her veins.  She has good veins for the 2 doses of mitomycin.  She has an appointment to see Dr. Rhunette Croft in [place] on 06/16/2018.  She will start Xeloda and mitomycin on day 1 of radiation.  We have prescribed Compazine to be taken 30 minutes prior to taking Xeloda.  Questions were encouraged and answered to her satisfaction.  2.  Right breast lump: - CT scan showed incidental right breast lump.  Patient reports that she is aware  of this for the last 25 years.  A needle biopsy at that time was benign.  Serial mammograms did not show any growth.     Total time spent is 40 minutes with more than 50% of the time spent face-to-face discussing treatment plan, side effects and coordination of care. Orders placed this encounter:  Orders Placed This Encounter  Procedures  . US Soft Tissue Head/Neck      Jagjit Riner, MD [facility] [phone]

## 2018-06-08 NOTE — Assessment & Plan Note (Signed)
1.  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  No inguinal lymphadenopathy palpable. -We discussed the results of CT scan of the chest, abdomen and pelvis which did not show any evidence of distant metastatic disease.  There is a very tiny questionable perirectal lymph node.  Not pathologically enlarged. - We discussed the results of the PET CT scan dated 06/06/2018 which shows a 3 cm segment uptake in the anal canal region.  No pelvic adenopathy.  There is uptake in the left lobe of the thyroid.  We will order a thyroid ultrasound for follow-up.  I have also called and talked to Dr. Suzy Bouchard, radiologist who read the PET scan.  The small area of uptake in the left pelvis was thought to be secondary to ureter. -We talked about the side effects of chemotherapy regimen containing Xeloda 825 mg/m2 twice daily Monday through Friday throughout the course of radiation.  Mitomycin will be given to 10 mg/m2 on day 1 and 829.  We discussed about the side effects in detail.  I will round of her Xeloda dose to 1150 mg twice daily.  We have sent a prescription to the specialty pharmacy.  We talked about the side effects of Xeloda in detail including but not limited to GI side effects, skin rashes, hand-foot skin reaction, cytopenias among others.  We also talked about mitomycin related side effects including renal insufficiency and severe cytopenias.  She understands and gives his permission to proceed  with the treatment.  Our chemotherapy nurse have checked her veins.  She has good veins for the 2 doses of mitomycin.  She has an appointment to see Dr. Rhunette Croft in [place] on 06/16/2018.  She will start Xeloda and mitomycin on day 1 of radiation.  We have prescribed Compazine to be taken 30 minutes prior to taking Xeloda.  Questions were encouraged and answered to her satisfaction.  2.  Right breast lump: - CT scan showed incidental right breast lump.  Patient reports that she is aware of this for the last 25 years.  A needle biopsy at that time was benign.  Serial mammograms did not show any growth.

## 2018-06-09 ENCOUNTER — Telehealth (HOSPITAL_COMMUNITY): Payer: Self-pay | Admitting: Hematology

## 2018-06-09 NOTE — Addendum Note (Signed)
Addended by: Gerhard Perches on: 06/09/2018 03:08 PM   Modules accepted: Orders

## 2018-06-09 NOTE — Patient Instructions (Signed)
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 We are going to treat you with the regimen Mitomycin and Capecitabine/Xeloda. You will take these chemo medications with radiation. The goal of treatment is CURE. You will see the doctor regularly throughout treatment. We monitor your lab work prior to every treatment. The doctor monitors your response to treatment by the way you are feeling, your blood work, and scans after treatment is complete. You will have wait times while you are here for your treatment. It can take about 30 minutes to 1 hour for your blood work to result. There is wait time while pharmacy mixes your medication.    You will have pre-medication that you receive prior to each chemotherapy: Zofran 8mg  through your IV.   Capecitabine/Xeloda tabets (500mg  and 150mg  tablets) Your dose is 1150mg  in the morning and 1150mg  in the evening M-F during/with radiation. Side Effects - diarrhea, hand-foot syndrome (hands/feet can get red/tender/and skin can peel). Avoid friction and hot environments on hands/feet. Wear cotton socks. Lotion twice a day to hands/fingers/feet/toes with Udder cream. Mucositis (inflammation of any mucosal membrane can develop -this can occur in the throat/mouth). Mouth sores, nausea/vomiting, anemia, fatigue can also develop. Take Imodium if diarrhea develops and contact us immediately - we will give you further instructions on how to take your Imodium. Xeloda usually comes with a teaching packet from the mail order/specialty pharmacy that will supply you with the drug that is really informative about diarrhea and hand-foot syndrome. No pregnant, child bearing age people, or animals should come into contact with this drug. Even though this is in pill form - it is still very powerful!!! If you should be instructed to discontinue taking this drug, bring the drug  into the [facility] and we will dispose of it in a safe manner. Chemotherapy is a biohazard and must be disposed of properly. Do not touch this pill much. It would be best if the caregiver wore gloves while handling. Do not put this pill in with the rest of your pills in a pill box. Keep them in a separate pill box. You should take this medication within 30 minutes of eating your morning and evening meal.  Mitomycin IV chemotherapy Side Effects: myelosuppression (low white blood cells, low red blood cells, low platelets)., nausea, vomiting, loss of appetite, hair loss, mouth sores or pain in mouth, kidney toxicity, lung toxicity, fatigue.    SELF CARE ACTIVITIES WHILE ON CHEMOTHERAPY:  Hydration Increase your fluid intake 48 hours prior to treatment and drink at least 8 to 12 cups (64 ounces) of water/decaff beverages per day after treatment. You can still have your cup of coffee or soda but these beverages do not count as part of your 8 to 12 cups that you need to drink daily. No alcohol intake.  Medications Continue taking your normal prescription medication as prescribed. If you start any new herbal or new supplements please let us know first to make sure it is safe.  Mouth Care Have teeth cleaned professionally before starting treatment. Keep dentures and partial plates clean. Use soft toothbrush and do not use mouthwashes that contain alcohol. Biotene is a good mouthwash that is available at most pharmacies or may be ordered by calling [phone]. Use warm salt water gargles (1 teaspoon salt per 1 quart warm water) before and after meals and at bedtime. Or you may rinse with  2 tablespoons of three-percent hydrogen peroxide mixed in eight ounces of water. If you are still having problems with your mouth or sores in your mouth please call the clinic. If you need dental work, please let the doctor know before you go for your appointment so that we can coordinate the best possible time for  you in regards to your chemo regimen. You need to also let your dentist know that you are actively taking chemo. We may need to do labs prior to your dental appointment.   Skin Care Always use sunscreen that has not expired and with SPF (Sun Protection Factor) of 50 or higher. Wear hats to protect your head from the sun. Remember to use sunscreen on your hands, ears, face, &feet. Use good moisturizing lotions such as udder cream, eucerin, or even Vaseline. Some chemotherapies can cause dry skin, color changes in your skin and nails.    Avoid long, hot showers or baths.  Use gentle, fragrance-free soaps and laundry detergent.  Use moisturizers, preferably creams or ointments rather than lotions because the thicker consistency is better at preventing skin dehydration. Apply the cream or ointment within 15 minutes of showering. Reapply moisturizer at night, and moisturize your hands every time after you wash them.  Hair Loss (if your doctor says your hair will fall out)   If your doctor says that your hair is likely to fall out, decide before you begin chemo whether you want to wear a wig. You may want to shop before treatment to match your hair color.  Hats, turbans, and scarves can also camouflage hair loss, although some people prefer to leave their heads uncovered. If you go bare-headed outdoors, be sure to use sunscreenon your scalp.  Cut your hair short. It eases the inconvenience of shedding lots of hair, but it also can reduce the emotional impact of watching your hair fall out.  Don't perm or color your hair during chemotherapy. Those chemical treatments are already damaging to hair and can enhance hair loss. Once your chemo treatments are done and your hair has grown back, it's OK to resume dyeing or perming hair. With chemotherapy, hair loss is almost always temporary. But when it grows back, it may be a different color or texture. In older adults who still had hair color  before chemotherapy, the new growth may be completely gray. Often, new hair is very fine and soft.  Infection Prevention Please wash your hands for at least 30 seconds using warm soapy water. Handwashing is the #1 way to prevent the spread of germs. Stay away from sick people or people who are getting over a cold. If you develop respiratory systems such as green/yellow mucus production or productive cough or persistent cough let us know and we will see if you need an antibiotic. It is a good idea to keep a pair of gloves on when going into grocery stores/[employer] to decrease your risk of coming into contact with germs on the carts, etc. Carry alcohol hand gel with you at all times and use it frequently if out in public. If your temperature reaches 100.5 or higher please call the clinic and let us know. If it is after hours or on the weekend please go to the ER if your temperature is over 100.5. Please have your own personal thermometer at home to use.   Sex and bodily fluids If you are going to have sex, a condom must be used to protect the person that isn't taking chemotherapy.  Chemo can decrease your libido (sex drive). For a few days after chemotherapy, chemotherapy can be excreted through your bodily fluids. When using the toilet please close the lid and flush the toilet twice. Do this for a few day after you have had chemotherapy.     Effects of chemotherapy on your sex life Some changes are simple and won't last long. They won't affect your sex life permanently. Sometimes you may feel:  too tired  not strong enough to be very active  sick or sore   not in the mood  anxious or low Your anxiety might not seem related to sex. For example, you may be worried about the cancer and how your treatment is going. Or you may be worried about money, or about how you family are coping with your illness. These things can cause stress, which can affect your interest in sex. It's important to  talk to your partner about how you feel. Remember - the changes to your sex life don't usually last long. There's usually no medical reason to stop having sex during chemo. The drugs won't have any long term physical effects on your performance or enjoyment of sex. Cancer can't be passed on to your partner during sex  Contraception It's important to use reliable contraception during treatment. Avoid getting pregnant while you or your partner are having chemotherapy. This isbecause the drugs may harm the baby. Sometimes chemotherapy drugs can leave a man or woman infertile. This means you would not be able to have children in the future. You might want to talk to someone about permanent infertility. It can be very difficult to learn that you may no longer be able to have children. Some people find counselling helpful. There might be ways to preserve your fertility, although this is easier for men than for women. You may want to speak to a fertility expert. You can talk about sperm banking or harvesting your eggs. You can also ask about other fertility options, such as donor eggs. If you haveor have had breast cancer, your doctor might advise you not to take the contraceptive pill. This is because the hormones in it might affect the cancer. It is not known for sure whether or not chemotherapy drugs can be passed on through semen or secretions from the vagina. Because of this some doctors advise people to use a barrier method if you have sex during treatment. This applies to vaginal, anal or oral sex. Generally, doctors advise a barrier method only for the time you are actually having the treatment and for about a week after your treatment. Advice like this can be worrying, but this does not mean that you have to avoid being intimate with your partner. You can still have close contact with your partner and continue to enjoy sex.  Animals If you have cats or birds we just ask that you not change the  litter or change the cage. Please have someone else do this for you while you are on chemotherapy.   Food Safety During and After Cancer Treatment Food safety is important for people both during and after cancer treatment. Cancer and cancer treatments, such as chemotherapy, radiation therapy, and stem cell/bone marrow transplantation, often weaken the immune system. This makes it harder for your body to protect itself from foodborne illness, also called food poisoning. Foodborne illness is caused by eating food that contains harmful bacteria, parasites, or viruses.  Foods to avoid Some foods have a higher risk of becoming tainted  with bacteria. These include:  Unwashed fresh fruit and vegetables, especially leafy vegetables that can hide dirt and other contaminants  Raw sprouts, such as alfalfa sprouts  Raw or undercooked beef, especially ground beef, or other raw or undercooked meat and poultry  Fatty, fried, or spicy foods immediately before or after treatment. These can sit heavy on your stomach and make you feel nauseous.  Raw or undercooked shellfish, such as oysters.  Sushi and sashimi, which often contain raw fish.   Unpasteurized beverages, such as unpasteurized fruit juices, raw milk, raw yogurt, or cider  Undercooked eggs, such as soft boiled, over easy, and poached; raw, unpasteurized eggs; or foods made with raw egg, such as homemade raw cookie dough and homemade mayonnaise Simple steps for food safety Shop smart.  Do not buy food stored or displayed in an unclean area.  Do not buy bruised or damaged fruits or vegetables.  Do not buy cans that have cracks, dents, or bulges.  Pick up foods that can spoil at the end of your shopping trip and store them in a cooler on the way home. Prepare and clean up foods carefully.  Rinse all fresh fruits and vegetables under running water, and dry them with a clean towel or paper towel.  Clean the top of cans before opening  them.  After preparing food, wash your hands for 20 seconds with hot water and soap. Pay special attention to areas between fingers and under nails.  Clean your utensils and dishes with hot water and soap.  Disinfect your kitchen and cutting boards using 1 teaspoon of liquid, unscented bleach mixed into 1 quart of water.  Dispose of old food.  Eat canned and packaged food before its expiration date (the "use by" or "best before" date).  Consume refrigerated leftovers within 3 to 4 days. After that time, throw out the food. Even if the food does not smell or look spoiled, it still may be unsafe. Some bacteria, such as Listeria, can grow even on foods stored in the refrigerator if they are kept for too long. Take precautions when eating out.  At restaurants, avoid buffets and salad bars where food sits out for a long time and comes in contact with many people. Food can become contaminated when someone with a virus, often a norovirus, or another "bug" handles it.  Put any leftover food in a "to-go" container yourself, rather than having the server do it. And, refrigerate leftovers as soon as you get home.  Choose restaurants that are clean and that are willing to prepare your food as you order it cooked.    MEDICATIONS:  Compazine/Prochlorperazine 10mg  tablet. Take 1 tablet every 6 hours as needed for nausea/vomiting. (this can make you sleepy)    Over-the-Counter Meds:  Colace 100mg  capsule. Take 2 capsules daily to help soften your stool. If this doesn't work, you may increase your Colace to 2 capsules in the morning and 2 capsules in the evening. If you do not have a bowel movement, please call the [facility].   Imodium 2mg  capsule - take 2 capsules after the first loose stool and then 1 capsule after each loose stool but do not exceed 8 capsules in a 24 hour period. If it is bedtime and you are having loose stools, take 2 capsules @ bedtime and then take 2 capsules every  4 hours until morning. Call the [facility]! We have to know if you are having loose stools! If it is the weekend,  go to the Emergency Room.      Constipation Sheet  Colace 100mg  capsule. Take 2 capsules daily to help soften your stool. If this doesn't work, you may increase your Colace to 2 capsules in the morning and 2 capsules in the evening. If you do not have a bowel movement, please call the [facility].   It is very important to NOT become constipated. It will make you feel sick to your stomach (nausea) and can cause abdominal pain and vomiting. Do not go more than 2 days without a bowel movement.    Diarrhea Sheet   If you are having loose stools/diarrhea, please purchase Imodium and begin taking as outlined:  Imodium 2mg  capsule - take 2 capsules after the first loose stool and then 1 capsule after each loose stool but do not exceed 8 capsules in a 24 hour period.   If it is bedtime and you are having loose stools, take 2 capsules @ bedtime and then take 2 capsules every 4 hours until morning. Call the [facility]! We have to know if you are having loose stools! If it is the weekend, go to the Emergency Room.   Always call the [facility] [phone]) if you are having loose stools/diarrhea that you can't get under control!! Loose stools/diarrhea leads to dehydration (loss of water) in your body! We have other options of trying to get the loose stools/diarrhea stopped but you must let us know!    Nausea Sheet   Compazine/Prochlorperazine10mg  tablet. Take 1 tablet every 6 hours as needed for nausea/vomiting. (can make you sleepy)  Ice chips, sips of clear liquids, foods that are @ room temperature, crackers, and toast tend to be better tolerated.    SYMPTOMS TO REPORT AS SOON AS POSSIBLE AFTER TREATMENT:  FEVER GREATER THAN 100.5 F  CHILLS WITH OR WITHOUT FEVER  NAUSEA AND VOMITING THAT IS NOT CONTROLLED WITH YOUR NAUSEA  MEDICATION  UNUSUAL SHORTNESS OF BREATH  UNUSUAL BRUISING OR BLEEDING  TENDERNESS IN MOUTH AND THROAT WITH OR WITHOUT PRESENCE OF ULCERS  URINARY PROBLEMS  BOWEL PROBLEMS  UNUSUAL RASH   Wear comfortable clothing and clothing appropriate for easy access to any Portacath or PICC line. Let us know if there is anything that we can do to make your therapy better!     What to do if you need assistance after hours or on the weekends: CALL [phone]. HOLD on the line, do not hang up. You will hear multiple messages but at the end you will be connected with a nurse triage line. They will contact the doctor if necessary. Most of the time they will be able to assist you. Do not call the hospital operator.     I have been informed and understand all of the instructions given to me and have received a copy. I have been instructed to call the clinic [phone] or my family physician as soon as possible for continued medical care, if indicated. I do not have any more questions at this time but understand that I may call the [facility] or the Patient Navigator at [phone] during office hours should I have questions or need assistance in obtaining follow-up care.

## 2018-06-09 NOTE — Telephone Encounter (Signed)
XELODA SCRIPT HAD TO BE TRANSFERRED FROM [facility] TO [facility]

## 2018-06-10 ENCOUNTER — Encounter (HOSPITAL_COMMUNITY): Payer: Self-pay

## 2018-06-10 ENCOUNTER — Encounter (HOSPITAL_COMMUNITY): Payer: Self-pay | Admitting: Emergency Medicine

## 2018-06-10 ENCOUNTER — Emergency Department (HOSPITAL_COMMUNITY)
Admission: EM | Admit: 2018-06-10 | Discharge: 2018-06-10 | Disposition: A | Discharge status: Home / Self Care | Payer: BLUE CROSS/BLUE SHIELD | Attending: Emergency Medicine | Admitting: Emergency Medicine

## 2018-06-10 ENCOUNTER — Other Ambulatory Visit: Payer: Self-pay

## 2018-06-10 DIAGNOSIS — K6289 Other specified diseases of anus and rectum: Secondary | ICD-10-CM | POA: Insufficient documentation

## 2018-06-10 DIAGNOSIS — Z5321 Procedure and treatment not carried out due to patient leaving prior to being seen by health care provider: Secondary | ICD-10-CM | POA: Diagnosis not present

## 2018-06-10 DIAGNOSIS — K645 Perianal venous thrombosis: Secondary | ICD-10-CM | POA: Diagnosis not present

## 2018-06-10 HISTORY — DX: Malignant (primary) neoplasm, unspecified: C80.1

## 2018-06-10 MED ORDER — DOCUSATE SODIUM 100 MG PO CAPS: 100.0000 mg | ORAL_CAPSULE | Freq: Two times a day (BID) | ORAL | 2 refills | Status: DC

## 2018-06-10 MED ORDER — OXYCODONE HCL 5 MG PO TABS
ORAL_TABLET | ORAL | Status: AC
  Filled 2018-06-10: qty 1

## 2018-06-10 MED ORDER — OXYCODONE HCL 5 MG PO TABS
5.0000 mg | ORAL_TABLET | Freq: Once | ORAL | Status: AC
  Administered 2018-06-10: 5 mg via ORAL

## 2018-06-10 MED ORDER — OXYCODONE HCL 5 MG PO TABS: 5.0000 mg | ORAL_TABLET | ORAL | 0 refills | Status: DC | PRN

## 2018-06-10 NOTE — Procedures (Signed)
[facility] Operative Note  06/10/18  Pre-procedure Diagnosis: Thrombosed hemorrhoid    Post- procedure Diagnosis: Same   Procedure(s) Performed: Incision of hemorrhoid and evacuation of clot   Surgeon: Lanell Matar. Constance Haw, MD   Estimated Blood Loss: Minimal   Wound Class:Dirty  Indications: Margaret Castaneda is a lady with newly diagnosed anal cancer and a thrombosed external hemorrhoid. She has had a thrombosed hemorrhoid in the past, and this pain was different from her anal pain from the cancer. She is going to under therapy for the cancer in the upcoming weeks.   Findings:Thrombosed posterior hemorrhoid    Procedure: The patient was placed on her side and the anus was examined. There was a thrombosed, hard posterior hemorrhoid which was purple in color. The anus was prepped with chloraprep.  A 11 blade was used to incise the hemorrhoid and clot was evacuated. She was felt relief. A clean ABD pad was placed.  Final inspection revealed acceptable hemostasis.     Curlene Labrum, MD [facility] [address] [place], [place] [address] [phone] (office)

## 2018-06-10 NOTE — Progress Notes (Signed)
Per Dr. Tomie China request, last office visit note was faxed to patients PCP, Dr. Shearon Stalls, and patients radiation oncologist, Dr. Rhunette Croft. Confirmation of fax transmission received.

## 2018-06-10 NOTE — ED Notes (Signed)
Dr Constance Haw in with pt at this time

## 2018-06-10 NOTE — Discharge Instructions (Signed)
How to Take a Sitz Bath A sitz bath is a warm water bath that is taken while you are sitting down. The water should only come up to your hips and should cover your buttocks. Your health care provider may recommend a sitz bath to help you:  Clean the lower part of your body, including your genital area.  With itching.  With pain.  With sore muscles or muscles that tighten or spasm.  How to take a sitz bath Take 3-4 sitz baths per day or as told by your health care provider. 1. Partially fill a bathtub with warm water. You will only need the water to be deep enough to cover your hips and buttocks when you are sitting in it. 2. If your health care provider told you to put medicine in the water, follow the directions exactly. 3. Sit in the water and open the tub drain a little. 4. Turn on the warm water again to keep the tub at the correct level. Keep the water running constantly. 5. Soak in the water for 15-20 minutes or as told by your health care provider. 6. After the sitz bath, pat the affected area dry first. Do not rub it. 7. Be careful when you stand up after the sitz bath because you may feel dizzy.  Contact a health care provider if:  Your symptoms get worse. Do not continue with sitz baths if your symptoms get worse.  You have new symptoms. Do not continue with sitz baths until you talk with your health care provider. This information is not intended to replace advice given to you by your health care provider. Make sure you discuss any questions you have with your health care provider. Document Released: 09/05/2004 Document Revised: 05/13/2016 Document Reviewed: 12/12/2014 Elsevier Interactive Patient Education  2018 [employer] Hemorrhoids Hemorrhoids are swollen veins in and around the rectum or anus. Hemorrhoids can cause pain, itching, or bleeding. Most of the time, they do not cause serious problems. They usually get better with diet changes, lifestyle changes, and other  home treatments. Follow these instructions at home: Eating and drinking  Eat foods that have fiber, such as whole grains, beans, nuts, fruits, and vegetables. Ask your doctor about taking products that have added fiber (fibersupplements).  Drink enough fluid to keep your pee (urine) clear or pale yellow. For Pain and Swelling  Take a warm-water bath (sitz bath) for 20 minutes to ease pain. Do this 3-4 times a day.  If directed, put ice on the painful area. It may be helpful to use ice between your warm baths. ? Put ice in a plastic bag. ? Place a towel between your skin and the bag. ? Leave the ice on for 20 minutes, 2-3 times a day. General instructions  Take over-the-counter and prescription medicines only as told by your doctor. ? Medicated creams and medicines that are inserted into the anus (suppositories) may be used or applied as told.  Exercise often.  Go to the bathroom when you have the urge to poop (to have a bowel movement). Do not wait.  Avoid pushing too hard (straining) when you poop.  Keep the butt area dry and clean. Use wet toilet paper or moist paper towels.  Do not sit on the toilet for a long time. Contact a doctor if:  You have any of these: ? Pain and swelling that do not get better with treatment or medicine. ? Bleeding that will not stop. ? Trouble pooping or you   cannot poop. ? Pain or swelling outside the area of the hemorrhoids. This information is not intended to replace advice given to you by your health care provider. Make sure you discuss any questions you have with your health care provider. Document Released: 09/22/2008 Document Revised: 05/21/2016 Document Reviewed: 08/28/2015 Elsevier Interactive Patient Education  2018 [employer]  

## 2018-06-10 NOTE — Consult Note (Signed)
[facility] Consult  Reason for Consult: Thrombosed hemorrhoid   Chief Complaint    Rectal Pain      Margaret Castaneda is a 62 y.o. female.  HPI: Margaret Castaneda is a 62 yo with newly diagnosed anal cancer and worsening anal pain in the last few days that was different from the pain she had with her cancer. She has had a thrombosed hemorrhoid in the past, and felt that this was similar in nature and could feel/ see the thrombosed hemorrhoid. She came to the Ed for evaluation. She has had no bleeding, and is not on blood thinners. She says she went to work today. .   Past Medical History:  Diagnosis Date  . Cancer ([place])    Anal Cancer  . Hypertension   . Rectal mass 05/18/2018    Past Surgical History:  Procedure Laterality Date  . COLONOSCOPY N/A 05/24/2018   Procedure: COLONOSCOPY;  Surgeon: Rogene Houston, MD;  Location: [facility] SU[facility];  Service: Endoscopy;  Laterality: N/A;  7:30  . reversal tubal ligation    . TONSILLECTOMY    . TUBAL LIGATION      Family History  Problem Relation Age of Onset  . Gastric cancer Paternal Grandfather   . Hypertension Mother   . Kidney cancer Father   . Stroke Father   . Heart attack Father   . Thyroid cancer Sister   . Hypertension Brother   . Breast cancer Maternal Grandmother   . Colon cancer Neg Hx     Social History   Tobacco Use  . Smoking status: Never Smoker  . Smokeless tobacco: Never Used  Substance Use Topics  . Alcohol use: Never    Frequency: Never  . Drug use: Never    Medications: I have reviewed the patient's current medications. Allergies as of 06/10/2018   No Known Allergies     Medication List    TAKE these medications   acetaminophen 500 MG tablet Commonly known as:  TYLENOL Take 1 tablet (500 mg total) by mouth every 6 (six) hours as needed for moderate pain or headache.   amLODipine 2.5 MG tablet Commonly known as:  NORVASC Take 2.5 mg by mouth every Monday, Wednesday, and Friday.   benazepril 10 MG tablet Commonly known as:  LOTENSIN Take 10 mg by mouth daily.   capecitabine 500 MG tablet Commonly known as:  XELODA Take 2 tablets (1,000 mg total) by mouth 2 (two) times daily after a meal.   capecitabine 150 MG tablet Commonly known as:  XELODA Take 1 tablet (150 mg total) by mouth 2 (two) times daily after a meal. Total dose of 1150 mg twice daily.  Take along with two 500 mg Xeloda tablets.   docusate sodium 100 MG capsule Commonly known as:  COLACE Take 1 capsule (100 mg total) by mouth 2 (two) times daily.   hydrochlorothiazide 12.5 MG capsule Commonly known as:  MICROZIDE Take 12.5 mg by mouth daily.   ibuprofen 200 MG tablet Commonly known as:  ADVIL,MOTRIN Take 200 mg by mouth every 6 (six) hours as needed for headache or moderate pain.   oxyCODONE 5 MG immediate release tablet Commonly known as:  ROXICODONE Take 1 tablet (5 mg total) by mouth every 4 (four) hours as needed for severe pain or breakthrough pain.   prochlorperazine 10 MG tablet Commonly known as:  COMPAZINE Take 1 tablet (10 mg total) by mouth every 6 (six) hours as needed for nausea or vomiting.  PROCTOZONE-HC 2.5 % rectal cream Generic drug:  hydrocortisone Place 1 application rectally 2 (two) times daily as needed for hemorrhoids or anal itching.        ROS:  A comprehensive review of systems was negative except for: Gastrointestinal: positive for anal pain, thrombosed hemorrhoid  Blood pressure (!) 147/91, pulse 99, temperature 99.1 F (37.3 C), temperature source Oral, resp. rate 18, height 5\' 1"  (1.549 m), weight 121 lb (54.9 kg), SpO2 100 %. Physical Exam  Constitutional: She is oriented to person, place, and time. She appears well-developed and well-nourished.  HENT:  Head: Normocephalic.  Eyes: Pupils are equal, round, and reactive to light.  Neck: Normal range of motion.  Cardiovascular: Normal rate and regular rhythm.  Pulmonary/Chest: Effort normal and  breath sounds normal.  Abdominal: Soft. She exhibits no distension. There is no tenderness.  Genitourinary: Rectal exam shows external hemorrhoid. Pelvic exam was performed with patient in the knee-chest position.  Genitourinary Comments: Thrombosed posterior hemorrhoid, tense and some purple discoloration  Musculoskeletal: She exhibits no edema.  Neurological: She is alert and oriented to person, place, and time.  Skin: Skin is warm and dry.  Psychiatric: She has a normal mood and affect. Her behavior is normal. Judgment and thought content normal.  Vitals reviewed.   Results: None   Assessment & Plan:  Margaret Castaneda is a 62 y.o. female with a thrombosed hemorrhoid that is obvious on exam.  -Incision and drainage of the clot of the hemorrhoid  -Follow up PRN -Pain meds prescribed for relief  -Sitz baths   All questions were answered to the satisfaction of the patient and family.  The risk and benefits of incision of the hemorrhoid were discussed including but not limited to bleeding, infection, and risk of no relief of the pain.  After careful consideration, Margaret Castaneda has decided to proceed with verbal consent to the procedure.     Margaret Castaneda 06/10/2018, 3:56 PM

## 2018-06-10 NOTE — ED Triage Notes (Signed)
Patient complaining of rectal pain x 2-3 days. States she has new diagnosis of anal cancer but hasn't started treatments yet. States she had rectal exam recently that she thinks caused a thrombosed hemorrhoid.

## 2018-06-13 ENCOUNTER — Ambulatory Visit (HOSPITAL_COMMUNITY)
Admission: RE | Admit: 2018-06-13 | Discharge: 2018-06-13 | Disposition: A | Discharge status: Home / Self Care | Payer: BLUE CROSS/BLUE SHIELD | Source: Ambulatory Visit | Attending: Hematology | Admitting: Hematology

## 2018-06-13 DIAGNOSIS — C211 Malignant neoplasm of anal canal: Secondary | ICD-10-CM | POA: Diagnosis not present

## 2018-06-13 DIAGNOSIS — E041 Nontoxic single thyroid nodule: Secondary | ICD-10-CM | POA: Insufficient documentation

## 2018-07-01 ENCOUNTER — Encounter (HOSPITAL_COMMUNITY): Payer: Self-pay | Admitting: *Deleted

## 2018-07-01 NOTE — Progress Notes (Signed)
I spoke with patient today and introduced myself as oncology navigator here at [facility].  I confirmed her having Xeloda prescription on hand and she does.  She knows to start taking the medication on Tuesday when she goes for radiation.  She states that she is to be simmed on Monday evening and then will start radiation on Tuesday.  She verbalizes understanding.  I did provider her with my direct number and if she is to need anything moving forward she can give me a call.

## 2018-07-05 ENCOUNTER — Inpatient Hospital Stay (HOSPITAL_COMMUNITY): Payer: BLUE CROSS/BLUE SHIELD | Attending: Hematology | Admitting: Hematology

## 2018-07-05 ENCOUNTER — Other Ambulatory Visit: Payer: Self-pay

## 2018-07-05 ENCOUNTER — Encounter (HOSPITAL_COMMUNITY): Payer: Self-pay | Admitting: Hematology

## 2018-07-05 ENCOUNTER — Inpatient Hospital Stay (HOSPITAL_COMMUNITY): Payer: BLUE CROSS/BLUE SHIELD

## 2018-07-05 ENCOUNTER — Encounter (HOSPITAL_COMMUNITY): Payer: Self-pay

## 2018-07-05 VITALS — BP 153/80 | HR 59 | Temp 97.5°F | Resp 18 | Wt 123.2 lb

## 2018-07-05 DIAGNOSIS — C211 Malignant neoplasm of anal canal: Secondary | ICD-10-CM

## 2018-07-05 DIAGNOSIS — E079 Disorder of thyroid, unspecified: Secondary | ICD-10-CM | POA: Diagnosis not present

## 2018-07-05 DIAGNOSIS — K5900 Constipation, unspecified: Secondary | ICD-10-CM

## 2018-07-05 DIAGNOSIS — Z5111 Encounter for antineoplastic chemotherapy: Secondary | ICD-10-CM | POA: Diagnosis present

## 2018-07-05 LAB — CBC WITH DIFFERENTIAL/PLATELET
BASOS ABS: 0.1 10*3/uL (ref 0.0–0.1)
BASOS PCT: 1 %
Eosinophils Absolute: 0.1 10*3/uL (ref 0.0–0.7)
Eosinophils Relative: 2 %
HEMATOCRIT: 39.5 % (ref 36.0–46.0)
HEMOGLOBIN: 13.1 g/dL (ref 12.0–15.0)
LYMPHS PCT: 29 %
Lymphs Abs: 2 10*3/uL (ref 0.7–4.0)
MCH: 30 pg (ref 26.0–34.0)
MCHC: 33.2 g/dL (ref 30.0–36.0)
MCV: 90.4 fL (ref 78.0–100.0)
Monocytes Absolute: 0.6 10*3/uL (ref 0.1–1.0)
Monocytes Relative: 9 %
NEUTROS ABS: 4.1 10*3/uL (ref 1.7–7.7)
NEUTROS PCT: 59 %
Platelets: 275 10*3/uL (ref 150–400)
RBC: 4.37 MIL/uL (ref 3.87–5.11)
RDW: 12.8 % (ref 11.5–15.5)
WBC: 6.9 10*3/uL (ref 4.0–10.5)

## 2018-07-05 LAB — COMPREHENSIVE METABOLIC PANEL
ALBUMIN: 4.2 g/dL (ref 3.5–5.0)
ALK PHOS: 72 U/L (ref 38–126)
ALT: 16 U/L (ref 0–44)
AST: 23 U/L (ref 15–41)
Anion gap: 8 (ref 5–15)
BILIRUBIN TOTAL: 0.5 mg/dL (ref 0.3–1.2)
BUN: 14 mg/dL (ref 8–23)
CALCIUM: 9.5 mg/dL (ref 8.9–10.3)
CO2: 31 mmol/L (ref 22–32)
Chloride: 101 mmol/L (ref 98–111)
Creatinine, Ser: 0.95 mg/dL (ref 0.44–1.00)
GFR calc Af Amer: >60 mL/min (ref >60)
GLUCOSE: 90 mg/dL (ref 70–99)
POTASSIUM: 3.8 mmol/L (ref 3.5–5.1)
Sodium: 140 mmol/L (ref 135–145)
TOTAL PROTEIN: 6.7 g/dL (ref 6.5–8.1)

## 2018-07-05 MED ORDER — ONDANSETRON HCL 40 MG/20ML IJ SOLN
Freq: Once | INTRAMUSCULAR | Status: AC
  Administered 2018-07-05: 12:00:00 via INTRAVENOUS
  Filled 2018-07-05: qty 4

## 2018-07-05 MED ORDER — SODIUM CHLORIDE 0.9 % IV SOLN
Freq: Once | INTRAVENOUS | Status: AC
  Administered 2018-07-05: 12:00:00 via INTRAVENOUS

## 2018-07-05 MED ORDER — MITOMYCIN CHEMO IV INJECTION 20 MG
10.0000 mg/m2 | Freq: Once | INTRAVENOUS | Status: AC
  Administered 2018-07-05: 15.5 mg via INTRAVENOUS
  Filled 2018-07-05: qty 31

## 2018-07-05 MED ORDER — SODIUM CHLORIDE 0.9% FLUSH
3.0000 mL | INTRAVENOUS | Status: DC | PRN
  Administered 2018-07-05: 3 mL
  Filled 2018-07-05: qty 10

## 2018-07-05 NOTE — Progress Notes (Signed)
1155 Labs reviewed with and pt seen by Dr. Delton Coombes and pt approved for Mitomycin infusion today per MD                                                                  Margaret Castaneda tolerated Mitomycin infusion well without complaints or incident.Pt and daughter instructed in chemotherapy medications,side effects and follow-up appts and both verbalized understanding. Peripheral IV site with positive blood return prior to,during infusion multiple times and after infusion completed. Blood return checked by 2 RN's each time. IV site flushed with saline for 30 minutes after infusion completed. VSS upon discharge. Pt discharged self ambulatory in satisfactory condition accompanied by family member

## 2018-07-05 NOTE — Patient Instructions (Signed)
[place] at [facility]  Discharge Instructions:  Today you received your Mitomycin infusion. Call the clinic for any questions or concerns. _______________________________________________________________  Thank you for choosing [place] at [facility] to provide your oncology and hematology care.  To afford each patient quality time with our providers, please arrive at least 15 minutes before your scheduled appointment.  You need to re-schedule your appointment if you arrive 10 or more minutes late.  We strive to give you quality time with our providers, and arriving late affects you and other patients whose appointments are after yours.  Also, if you no show three or more times for appointments you may be dismissed from the clinic.  Again, thank you for choosing [place] at [place] hope is that these requests will allow you access to exceptional care and in a timely manner. _______________________________________________________________  If you have questions after your visit, please contact our office at [phone] between the hours of 8:30 a.m. and 5:00 p.m. Voicemails left after 4:30 p.m. will not be returned until the following business day. _______________________________________________________________  For prescription refill requests, have your pharmacy contact our office. _______________________________________________________________  Recommendations made by the consultant and any test results will be sent to your referring physician. _______________________________________________________________

## 2018-07-05 NOTE — Progress Notes (Signed)
[place], [place] [id number]   CLINIC:  Medical Oncology/Hematology  PCP:  Kennieth Rad, MD [facility] [address] [place] [address] [phone]   REASON FOR VISIT:  Follow-up for anal cancer.  CURRENT THERAPY: Xeloda and mitomycin.  BRIEF ONCOLOGIC HISTORY:    Primary cancer of anal canal ([place])   06/02/2018 Initial Diagnosis    Primary cancer of anal canal ([place])      06/09/2018 -  Chemotherapy    The patient had ondansetron (ZOFRAN) 8 mg in sodium chloride 0.9 % 50 mL IVPB, , Intravenous,  Once, 0 of 1 cycle mitoMYcin (MUTAMYCIN) chemo injection 15.5 mg, 10 mg/m2 = 15.5 mg, Intravenous,  Once, 0 of 1 cycle  for chemotherapy treatment.         INTERVAL HISTORY:  Margaret Castaneda 62 y.o. female returns for follow-up and to initiate chemotherapy today.  She started radiation this morning.  She took Xeloda 1150 mg tablet after taking Compazine.  Has occasional rectal pain, but could not differentiate between thrombosed hemorrhoid and cancer pain.  Constipation is well controlled with Colace 1 tablet twice a day.    REVIEW OF SYSTEMS:  Review of Systems  Gastrointestinal: Positive for constipation.  All other systems reviewed and are negative.    PAST MEDICAL/SURGICAL HISTORY:  Past Medical History:  Diagnosis Date  . Cancer ([place])    Anal Cancer  . Hypertension   . Rectal mass 05/18/2018   Past Surgical History:  Procedure Laterality Date  . COLONOSCOPY N/A 05/24/2018   Procedure: COLONOSCOPY;  Surgeon: Rogene Houston, MD;  Location[facility];  Service: Endoscopy;  Laterality: N/A;  7:30  . reversal tubal ligation    . TONSILLECTOMY    . TUBAL LIGATION       SOCIAL HISTORY:  Social History   Socioeconomic History  . Marital status: Married    Spouse name: Not on file  . Number of children: Not on file  . Years of education: Not on file  . Highest education level: Not on file  Occupational  History  . Not on file  Social Needs  . Financial resource strain: Not on file  . Food insecurity:    Worry: Not on file    Inability: Not on file  . Transportation needs:    Medical: Not on file    Non-medical: Not on file  Tobacco Use  . Smoking status: Never Smoker  . Smokeless tobacco: Never Used  Substance and Sexual Activity  . Alcohol use: Never    Frequency: Never  . Drug use: Never  . Sexual activity: Not on file  Lifestyle  . Physical activity:    Days per week: Not on file    Minutes per session: Not on file  . Stress: Not on file  Relationships  . Social connections:    Talks on phone: Not on file    Gets together: Not on file    Attends religious service: Not on file    Active member of club or organization: Not on file    Attends meetings of clubs or organizations: Not on file    Relationship status: Not on file  . Intimate partner violence:    Fear of current or ex partner: Not on file    Emotionally abused: Not on file    Physically abused: Not on file    Forced sexual activity: Not on file  Other Topics Concern  .  Not on file  Social History Narrative  . Not on file    FAMILY HISTORY:  Family History  Problem Relation Age of Onset  . Gastric cancer Paternal Grandfather   . Hypertension Mother   . Kidney cancer Father   . Stroke Father   . Heart attack Father   . Thyroid cancer Sister   . Hypertension Brother   . Breast cancer Maternal Grandmother   . Colon cancer Neg Hx     CURRENT MEDICATIONS:  Outpatient Encounter Medications as of 07/05/2018  Medication Sig  . acetaminophen (TYLENOL) 500 MG tablet Take 1 tablet (500 mg total) by mouth every 6 (six) hours as needed for moderate pain or headache.  Marland Kitchen amLODipine (NORVASC) 2.5 MG tablet Take 2.5 mg by mouth every Monday, Wednesday, and Friday.   . benazepril (LOTENSIN) 10 MG tablet Take 10 mg by mouth daily.  . capecitabine (XELODA) 150 MG tablet Take 1 tablet (150 mg total) by mouth 2 (two)  times daily after a meal. Total dose of 1150 mg twice daily.  Take along with two 500 mg Xeloda tablets.  . capecitabine (XELODA) 500 MG tablet Take 2 tablets (1,000 mg total) by mouth 2 (two) times daily after a meal.  . docusate sodium (COLACE) 100 MG capsule Take 1 capsule (100 mg total) by mouth 2 (two) times daily.  . hydrochlorothiazide (MICROZIDE) 12.5 MG capsule Take 12.5 mg by mouth daily.  Marland Kitchen ibuprofen (ADVIL,MOTRIN) 200 MG tablet Take 200 mg by mouth every 6 (six) hours as needed for headache or moderate pain.  Marland Kitchen oxyCODONE (ROXICODONE) 5 MG immediate release tablet Take 1 tablet (5 mg total) by mouth every 4 (four) hours as needed for severe pain or breakthrough pain.  Marland Kitchen prochlorperazine (COMPAZINE) 10 MG tablet Take 1 tablet (10 mg total) by mouth every 6 (six) hours as needed for nausea or vomiting.  Marland Kitchen PROCTOZONE-HC 2.5 % rectal cream Place 1 application rectally 2 (two) times daily as needed for hemorrhoids or anal itching.    No facility-administered encounter medications on file as of 07/05/2018.     ALLERGIES:  No Known Allergies   PHYSICAL EXAM:  ECOG Performance status: 0 I have reviewed her vitals.  Blood pressure is 170/80.  Pulse rate is 77 respirate is 18 temperature 98.2, saturations 96%.  Physical Exam   LABORATORY DATA:  I have reviewed the labs as listed.  CBC    Component Value Date/Time   WBC 6.9 07/05/2018 1045   RBC 4.37 07/05/2018 1045   HGB 13.1 07/05/2018 1045   HCT 39.5 07/05/2018 1045   PLT 275 07/05/2018 1045   MCV 90.4 07/05/2018 1045   MCH 30.0 07/05/2018 1045   MCHC 33.2 07/05/2018 1045   RDW 12.8 07/05/2018 1045   LYMPHSABS 2.0 07/05/2018 1045   MONOABS 0.6 07/05/2018 1045   EOSABS 0.1 07/05/2018 1045   BASOSABS 0.1 07/05/2018 1045   CMP Latest Ref Rng & Units 07/05/2018 05/18/2018  Glucose 70 - 99 mg/dL 90 111  BUN 8 - 23 mg/dL 14 12  Creatinine 0.44 - 1.00 mg/dL 0.95 0.95  Sodium 135 - 145 mmol/L 140 141  Potassium 3.5 - 5.1 mmol/L  3.8 3.7  Chloride 98 - 111 mmol/L 101 101  CO2 22 - 32 mmol/L 31 33(H)  Calcium 8.9 - 10.3 mg/dL 9.5 9.8  Total Protein 6.5 - 8.1 g/dL 6.7 7.0  Total Bilirubin 0.3 - 1.2 mg/dL 0.5 0.6  Alkaline Phos 38 - 126 U/L 72 -  AST 15 - 41 U/L 23 23  ALT 0 - 44 U/L 16 17       DIAGNOSTIC IMAGING:  I have reviewed thyroid ultrasound dated 06/13/2018 with the patient.     ASSESSMENT & PLAN:   Primary cancer of anal canal ([place]) 1.  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  No inguinal lymphadenopathy palpable. -We discussed the results of CT scan of the chest, abdomen and pelvis which did not show any evidence of distant metastatic disease.  There is a very tiny questionable perirectal lymph node.  Not pathologically enlarged. - PET CT scan dated 06/06/2018  shows a 3 cm segment uptake in the anal canal region.  No pelvic adenopathy.  There is uptake in the left lobe of the thyroid.  We will order a thyroid ultrasound for follow-up.  I have also called and talked to Dr. Suzy Bouchard, radiologist who read the PET scan.  The small area of uptake in the left pelvis was thought to be secondary to ureter. - She started radiation therapy this morning.  She also took Xeloda 1150 mg this morning.  She will take it twice daily Monday through Friday.  I have reviewed her labs today.  She will receive mitomycin day 1 today at 10 mg/m.  We went over the side effects in detail.  We will check her  labs on a weekly basis.  I will see her back in 2 weeks for follow-up.  2.  Right breast lump: - CT scan showed incidental right breast lump.  Patient reports that she is aware of this for the last 25 years.  A needle biopsy at that time was benign.  Serial mammograms did not show any growth.  3.  Left thyroid nodule: -This was incidental finding on PET CT scan.  Follow-up ultrasound confirmed this area.  We will get biopsy of this area once she finishes radiation and chemotherapy.      Orders placed this encounter:  No orders of the defined types were placed in this encounter.     Derek Jack, MD [place] [phone]

## 2018-07-05 NOTE — Assessment & Plan Note (Signed)
1.  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  No inguinal lymphadenopathy palpable. -We discussed the results of CT scan of the chest, abdomen and pelvis which did not show any evidence of distant metastatic disease.  There is a very tiny questionable perirectal lymph node.  Not pathologically enlarged. - PET CT scan dated 06/06/2018  shows a 3 cm segment uptake in the anal canal region.  No pelvic adenopathy.  There is uptake in the left lobe of the thyroid.  We will order a thyroid ultrasound for follow-up.  I have also called and talked to Dr. Suzy Bouchard, radiologist who read the PET scan.  The small area of uptake in the left pelvis was thought to be secondary to ureter. - She started radiation therapy this morning.  She also took Xeloda 1150 mg this morning.  She will take it twice daily Monday through Friday.  I have reviewed her labs today.  She will receive mitomycin day 1 today at 10 mg/m.  We went over the side effects in detail.  We will check her labs on a weekly basis.  I will see her back in 2 weeks for follow-up.  2.  Right breast lump: - CT scan showed incidental right breast lump.  Patient reports that she is aware of this for the last 25 years.  A needle biopsy at that time was benign.  Serial mammograms did not show any growth.  3.  Left thyroid nodule: -This was incidental finding on PET CT scan.  Follow-up ultrasound confirmed this area.  We will get biopsy of this  area once she finishes radiation and chemotherapy.

## 2018-07-06 ENCOUNTER — Telehealth (HOSPITAL_COMMUNITY): Payer: Self-pay

## 2018-07-06 NOTE — Telephone Encounter (Signed)
24 hour Chemo F/U call: Pt reported she is doing extremely well and is presently at work. She denies any issues and reports that the peripheral IV site used yesterday for Mitomycin infusion looks good without any redness,swelling or discomfort. Pt encouraged to call for any problems or questions with understanding verbalized

## 2018-07-12 ENCOUNTER — Inpatient Hospital Stay (HOSPITAL_COMMUNITY): Payer: BLUE CROSS/BLUE SHIELD

## 2018-07-12 DIAGNOSIS — C211 Malignant neoplasm of anal canal: Secondary | ICD-10-CM

## 2018-07-12 DIAGNOSIS — Z5111 Encounter for antineoplastic chemotherapy: Secondary | ICD-10-CM | POA: Diagnosis not present

## 2018-07-12 LAB — COMPREHENSIVE METABOLIC PANEL
ALBUMIN: 4 g/dL (ref 3.5–5.0)
ALK PHOS: 69 U/L (ref 38–126)
ALT: 16 U/L (ref 0–44)
ANION GAP: 7 (ref 5–15)
AST: 21 U/L (ref 15–41)
BILIRUBIN TOTAL: 0.6 mg/dL (ref 0.3–1.2)
BUN: 12 mg/dL (ref 8–23)
CALCIUM: 9.2 mg/dL (ref 8.9–10.3)
CO2: 29 mmol/L (ref 22–32)
Chloride: 101 mmol/L (ref 98–111)
Creatinine, Ser: 0.84 mg/dL (ref 0.44–1.00)
GFR calc Af Amer: >60 mL/min (ref >60)
GFR calc non Af Amer: >60 mL/min (ref >60)
Glucose, Bld: 82 mg/dL (ref 70–99)
Potassium: 3.5 mmol/L (ref 3.5–5.1)
SODIUM: 137 mmol/L (ref 135–145)
TOTAL PROTEIN: 6.6 g/dL (ref 6.5–8.1)

## 2018-07-18 ENCOUNTER — Other Ambulatory Visit (HOSPITAL_COMMUNITY): Payer: Self-pay

## 2018-07-18 DIAGNOSIS — C211 Malignant neoplasm of anal canal: Secondary | ICD-10-CM

## 2018-07-18 DIAGNOSIS — E041 Nontoxic single thyroid nodule: Secondary | ICD-10-CM

## 2018-07-19 ENCOUNTER — Inpatient Hospital Stay (HOSPITAL_COMMUNITY): Payer: BLUE CROSS/BLUE SHIELD

## 2018-07-19 DIAGNOSIS — C211 Malignant neoplasm of anal canal: Secondary | ICD-10-CM

## 2018-07-19 DIAGNOSIS — Z5111 Encounter for antineoplastic chemotherapy: Secondary | ICD-10-CM | POA: Diagnosis not present

## 2018-07-19 DIAGNOSIS — E041 Nontoxic single thyroid nodule: Secondary | ICD-10-CM

## 2018-07-19 LAB — COMPREHENSIVE METABOLIC PANEL
ALT: 19 U/L (ref 0–44)
AST: 22 U/L (ref 15–41)
Albumin: 3.7 g/dL (ref 3.5–5.0)
Alkaline Phosphatase: 59 U/L (ref 38–126)
Anion gap: 7 (ref 5–15)
BUN: 13 mg/dL (ref 8–23)
CHLORIDE: 101 mmol/L (ref 98–111)
CO2: 31 mmol/L (ref 22–32)
Calcium: 9 mg/dL (ref 8.9–10.3)
Creatinine, Ser: 0.85 mg/dL (ref 0.44–1.00)
GFR calc Af Amer: >60 mL/min (ref >60)
Glucose, Bld: 92 mg/dL (ref 70–99)
POTASSIUM: 3.7 mmol/L (ref 3.5–5.1)
Sodium: 139 mmol/L (ref 135–145)
Total Bilirubin: 0.5 mg/dL (ref 0.3–1.2)
Total Protein: 6.1 g/dL — ABNORMAL LOW (ref 6.5–8.1)

## 2018-07-19 LAB — CBC WITH DIFFERENTIAL/PLATELET
BASOS ABS: 0 10*3/uL (ref 0.0–0.1)
BASOS PCT: 0 %
EOS PCT: 8 %
Eosinophils Absolute: 0.3 10*3/uL (ref 0.0–0.7)
HCT: 33.3 % — ABNORMAL LOW (ref 36.0–46.0)
Hemoglobin: 11.2 g/dL — ABNORMAL LOW (ref 12.0–15.0)
Lymphocytes Relative: 14 %
Lymphs Abs: 0.6 10*3/uL — ABNORMAL LOW (ref 0.7–4.0)
MCH: 30.2 pg (ref 26.0–34.0)
MCHC: 33.6 g/dL (ref 30.0–36.0)
MCV: 89.8 fL (ref 78.0–100.0)
MONO ABS: 0.7 10*3/uL (ref 0.1–1.0)
Monocytes Relative: 16 %
Neutro Abs: 2.6 10*3/uL (ref 1.7–7.7)
Neutrophils Relative %: 62 %
PLATELETS: 138 10*3/uL — AB (ref 150–400)
RBC: 3.71 MIL/uL — ABNORMAL LOW (ref 3.87–5.11)
RDW: 12.4 % (ref 11.5–15.5)
WBC: 4.2 10*3/uL (ref 4.0–10.5)

## 2018-07-26 ENCOUNTER — Inpatient Hospital Stay (HOSPITAL_COMMUNITY): Payer: BLUE CROSS/BLUE SHIELD

## 2018-07-26 DIAGNOSIS — Z5111 Encounter for antineoplastic chemotherapy: Secondary | ICD-10-CM | POA: Diagnosis not present

## 2018-07-26 DIAGNOSIS — E041 Nontoxic single thyroid nodule: Secondary | ICD-10-CM

## 2018-07-26 DIAGNOSIS — C211 Malignant neoplasm of anal canal: Secondary | ICD-10-CM

## 2018-07-26 LAB — COMPREHENSIVE METABOLIC PANEL
ALT: 42 U/L (ref 0–44)
ANION GAP: 7 (ref 5–15)
AST: 30 U/L (ref 15–41)
Albumin: 3.7 g/dL (ref 3.5–5.0)
Alkaline Phosphatase: 58 U/L (ref 38–126)
BUN: 14 mg/dL (ref 8–23)
CHLORIDE: 103 mmol/L (ref 98–111)
CO2: 29 mmol/L (ref 22–32)
Calcium: 9.3 mg/dL (ref 8.9–10.3)
Creatinine, Ser: 0.86 mg/dL (ref 0.44–1.00)
GFR calc Af Amer: >60 mL/min (ref >60)
GFR calc non Af Amer: >60 mL/min (ref >60)
Glucose, Bld: 90 mg/dL (ref 70–99)
POTASSIUM: 4.2 mmol/L (ref 3.5–5.1)
Sodium: 139 mmol/L (ref 135–145)
Total Bilirubin: 0.6 mg/dL (ref 0.3–1.2)
Total Protein: 6.2 g/dL — ABNORMAL LOW (ref 6.5–8.1)

## 2018-07-26 LAB — CBC WITH DIFFERENTIAL/PLATELET
BASOS ABS: 0 10*3/uL (ref 0.0–0.1)
BASOS PCT: 1 %
EOS PCT: 8 %
Eosinophils Absolute: 0.3 10*3/uL (ref 0.0–0.7)
HCT: 33.2 % — ABNORMAL LOW (ref 36.0–46.0)
Hemoglobin: 11.1 g/dL — ABNORMAL LOW (ref 12.0–15.0)
LYMPHS PCT: 15 %
Lymphs Abs: 0.6 10*3/uL — ABNORMAL LOW (ref 0.7–4.0)
MCH: 30.3 pg (ref 26.0–34.0)
MCHC: 33.4 g/dL (ref 30.0–36.0)
MCV: 90.7 fL (ref 78.0–100.0)
MONO ABS: 0.7 10*3/uL (ref 0.1–1.0)
Monocytes Relative: 18 %
Neutro Abs: 2.1 10*3/uL (ref 1.7–7.7)
Neutrophils Relative %: 58 %
PLATELETS: 132 10*3/uL — AB (ref 150–400)
RBC: 3.66 MIL/uL — ABNORMAL LOW (ref 3.87–5.11)
RDW: 13.2 % (ref 11.5–15.5)
WBC: 3.6 10*3/uL — ABNORMAL LOW (ref 4.0–10.5)

## 2018-08-01 ENCOUNTER — Inpatient Hospital Stay (HOSPITAL_COMMUNITY): Payer: BLUE CROSS/BLUE SHIELD | Attending: Hematology | Admitting: Hematology

## 2018-08-01 ENCOUNTER — Encounter (HOSPITAL_COMMUNITY): Payer: Self-pay | Admitting: Hematology

## 2018-08-01 ENCOUNTER — Inpatient Hospital Stay (HOSPITAL_COMMUNITY): Payer: BLUE CROSS/BLUE SHIELD

## 2018-08-01 ENCOUNTER — Other Ambulatory Visit: Payer: Self-pay

## 2018-08-01 VITALS — BP 107/63 | HR 74 | Temp 98.7°F | Resp 18 | Wt 127.2 lb

## 2018-08-01 DIAGNOSIS — E079 Disorder of thyroid, unspecified: Secondary | ICD-10-CM | POA: Diagnosis not present

## 2018-08-01 DIAGNOSIS — Z5111 Encounter for antineoplastic chemotherapy: Secondary | ICD-10-CM | POA: Diagnosis present

## 2018-08-01 DIAGNOSIS — R197 Diarrhea, unspecified: Secondary | ICD-10-CM | POA: Diagnosis not present

## 2018-08-01 DIAGNOSIS — E876 Hypokalemia: Secondary | ICD-10-CM

## 2018-08-01 DIAGNOSIS — R300 Dysuria: Secondary | ICD-10-CM | POA: Diagnosis not present

## 2018-08-01 DIAGNOSIS — C211 Malignant neoplasm of anal canal: Secondary | ICD-10-CM

## 2018-08-01 DIAGNOSIS — E041 Nontoxic single thyroid nodule: Secondary | ICD-10-CM

## 2018-08-01 LAB — COMPREHENSIVE METABOLIC PANEL
ALT: 44 U/L (ref 0–44)
ANION GAP: 7 (ref 5–15)
AST: 29 U/L (ref 15–41)
Albumin: 3.8 g/dL (ref 3.5–5.0)
Alkaline Phosphatase: 68 U/L (ref 38–126)
BUN: 15 mg/dL (ref 8–23)
CHLORIDE: 101 mmol/L (ref 98–111)
CO2: 30 mmol/L (ref 22–32)
CREATININE: 0.87 mg/dL (ref 0.44–1.00)
Calcium: 9.2 mg/dL (ref 8.9–10.3)
GFR calc non Af Amer: >60 mL/min (ref >60)
Glucose, Bld: 103 mg/dL — ABNORMAL HIGH (ref 70–99)
POTASSIUM: 3.3 mmol/L — AB (ref 3.5–5.1)
SODIUM: 138 mmol/L (ref 135–145)
Total Bilirubin: 0.5 mg/dL (ref 0.3–1.2)
Total Protein: 6.4 g/dL — ABNORMAL LOW (ref 6.5–8.1)

## 2018-08-01 LAB — CBC WITH DIFFERENTIAL/PLATELET
Basophils Absolute: 0 10*3/uL (ref 0.0–0.1)
Basophils Relative: 0 %
EOS ABS: 0.6 10*3/uL (ref 0.0–0.7)
EOS PCT: 7 %
HCT: 34.6 % — ABNORMAL LOW (ref 36.0–46.0)
Hemoglobin: 11.9 g/dL — ABNORMAL LOW (ref 12.0–15.0)
Lymphocytes Relative: 9 %
Lymphs Abs: 0.8 10*3/uL (ref 0.7–4.0)
MCH: 31.2 pg (ref 26.0–34.0)
MCHC: 34.4 g/dL (ref 30.0–36.0)
MCV: 90.8 fL (ref 78.0–100.0)
MONO ABS: 0.4 10*3/uL (ref 0.1–1.0)
Monocytes Relative: 5 %
Neutro Abs: 6.5 10*3/uL (ref 1.7–7.7)
Neutrophils Relative %: 79 %
PLATELETS: 183 10*3/uL (ref 150–400)
RBC: 3.81 MIL/uL — ABNORMAL LOW (ref 3.87–5.11)
RDW: 14.3 % (ref 11.5–15.5)
WBC: 8.3 10*3/uL (ref 4.0–10.5)

## 2018-08-01 MED ORDER — SODIUM CHLORIDE 0.9 % IV SOLN
Freq: Once | INTRAVENOUS | Status: AC
  Administered 2018-08-01: 12:00:00 via INTRAVENOUS
  Filled 2018-08-01: qty 4

## 2018-08-01 MED ORDER — MITOMYCIN CHEMO IV INJECTION 20 MG
10.0000 mg/m2 | Freq: Once | INTRAVENOUS | Status: AC
  Administered 2018-08-01: 15.5 mg via INTRAVENOUS
  Filled 2018-08-01: qty 31

## 2018-08-01 MED ORDER — POTASSIUM CHLORIDE CRYS ER 20 MEQ PO TBCR
EXTENDED_RELEASE_TABLET | ORAL | Status: AC
  Filled 2018-08-01: qty 1

## 2018-08-01 MED ORDER — POTASSIUM CHLORIDE CRYS ER 20 MEQ PO TBCR
20.0000 meq | EXTENDED_RELEASE_TABLET | Freq: Once | ORAL | Status: AC
  Administered 2018-08-01: 20 meq via ORAL

## 2018-08-01 MED ORDER — SODIUM CHLORIDE 0.9% FLUSH
10.0000 mL | INTRAVENOUS | Status: DC | PRN
  Administered 2018-08-01: 10 mL
  Filled 2018-08-01: qty 10

## 2018-08-01 MED ORDER — SODIUM CHLORIDE 0.9 % IV SOLN
Freq: Once | INTRAVENOUS | Status: AC
  Administered 2018-08-01: 12:00:00 via INTRAVENOUS

## 2018-08-01 NOTE — Assessment & Plan Note (Signed)
1.  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  No inguinal lymphadenopathy palpable. -We discussed the results of CT scan of the chest, abdomen and pelvis which did not show any evidence of distant metastatic disease.  There is a very tiny questionable perirectal lymph node.  Not pathologically enlarged. - PET CT scan dated 06/06/2018  shows a 3 cm segment uptake in the anal canal region.  No pelvic adenopathy.  There is uptake in the left lobe of the thyroid.  We will order a thyroid ultrasound for follow-up.  I have also called and talked to Dr. Suzy Bouchard, radiologist who read the PET scan.  The small area of uptake in the left pelvis was thought to be secondary to ureter. - Day 1 of radiation with Xeloda 1150 mg twice daily Monday through Friday on 07/05/2018.  She is tolerating Xeloda and radiation very well.  She also received mitomycin day 1 on the same day.  Denies any major diarrhea.  She has only taken 3 tablets of Imodium so far.  Energy levels are great.  She may proceed with day 28 of mitomycin today.  This will complete her treatment with mitomycin.  She will finish radiation therapy on 08/17/2018.  I will see her back on 08/16/2018 with blood checks.  She will receive potassium 20 mEq today.  2.  Right breast lump: - CT scan showed incidental right breast lump.  Patient reports that she is aware of this for the last 25 years.  A needle biopsy at that time was benign.  Serial  mammograms did not show any growth.  3.  Left thyroid nodule: -This was incidental finding on PET CT scan.  Follow-up ultrasound confirmed this area.  We will get biopsy of this area once she finishes radiation and chemotherapy.

## 2018-08-01 NOTE — Progress Notes (Signed)
Patient to treatment area after oncology follow up visit.  Ok to treat today with labs and to give the patient Potassium Chloride 98meq verbal order Dr. Delton Coombes.    Patient tolerated chemotherapy with no complaints voiced.  Peripheral IV site with good blood return before, during administration of chemo, and after chemotherapy.  Blood return checked multiple times during Mitomycin infusion.  Blood checked by two RN's before administration of chemotherapy.  Patient denied any burning with administration of chemotherapy.  No bruising or swelling noted at site and denied pain.  Band aid applied.  VSS with discharge and left ambulatory with family with no s/s of distress noted.

## 2018-08-01 NOTE — Patient Instructions (Signed)
[facility] Discharge Instructions for Patients Receiving Chemotherapy  Today you received the following chemotherapy agents mitomycin.    If you develop nausea and vomiting that is not controlled by your nausea medication, call the clinic.   BELOW ARE SYMPTOMS THAT SHOULD BE REPORTED IMMEDIATELY:  *FEVER GREATER THAN 100.5 F  *CHILLS WITH OR WITHOUT FEVER  NAUSEA AND VOMITING THAT IS NOT CONTROLLED WITH YOUR NAUSEA MEDICATION  *UNUSUAL SHORTNESS OF BREATH  *UNUSUAL BRUISING OR BLEEDING  TENDERNESS IN MOUTH AND THROAT WITH OR WITHOUT PRESENCE OF ULCERS  *URINARY PROBLEMS  *BOWEL PROBLEMS  UNUSUAL RASH Items with * indicate a potential emergency and should be followed up as soon as possible.  Feel free to call the clinic should you have any questions or concerns. The clinic phone number is [phone].  Please show the [facility] at check-in to the Emergency Department and triage nurse.

## 2018-08-01 NOTE — Progress Notes (Signed)
Margaret Castaneda,  [id number]   CLINIC:  Medical Oncology/Hematology  PCP:  Kennieth Rad, MD [facility] [address] [place] [address] [phone]   REASON FOR VISIT:  Follow-up for anal cancer  CURRENT THERAPY: mitomycin and Xeloda  BRIEF ONCOLOGIC HISTORY:    Primary cancer of anal canal ([place])   06/02/2018 Initial Diagnosis    Primary cancer of anal canal ([place])      06/09/2018 -  Chemotherapy    The patient had ondansetron (ZOFRAN) 8 mg in sodium chloride 0.9 % 50 mL IVPB, , Intravenous,  Once, 1 of 1 cycle Administration:  (07/05/2018) mitoMYcin (MUTAMYCIN) chemo injection 15.5 mg, 10 mg/m2 = 15.5 mg, Intravenous,  Once, 1 of 1 cycle Administration: 15.5 mg (07/05/2018)  for chemotherapy treatment.         INTERVAL HISTORY:  Margaret Castaneda 62 y.o. female returns for routine follow-up anal cancer and her last treatment of chemo. Patient is here today with her daughter. She is doing well with the treatment plan. He has a few episodes of diarrhea and take imodium which helps stop it. Patient has sunspots that has come out since treatment. They are on her legs and arms and they are red no pain or itching. She is receiving radiation and her last day of treatment is aug 21st. She has a slight burn at the radiation spot today and was examined by this doctor this morning. She also had burning with urination and is taking OTC AZO and it has helped. Patient denies any bleeding or blood in stools. Denies any fever, or recent infections. Patient appetite is 100% and she is maintaining her weight. Patient energy level is at 75%.     REVIEW OF SYSTEMS:  Review of Systems  Constitutional: Negative.   HENT:  Negative.   Eyes: Negative.   Respiratory: Negative.   Cardiovascular: Negative.   Gastrointestinal: Positive for diarrhea.  Endocrine: Negative.   Genitourinary: Negative.    Musculoskeletal: Negative.   Skin: Negative.     Neurological: Negative.   Hematological: Negative.   Psychiatric/Behavioral: Negative.      PAST MEDICAL/SURGICAL HISTORY:  Past Medical History:  Diagnosis Date  . Cancer ([place])    Anal Cancer  . Hypertension   . Rectal mass 05/18/2018   Past Surgical History:  Procedure Laterality Date  . COLONOSCOPY N/A 05/24/2018   Procedure: COLONOSCOPY;  Surgeon: Rogene Houston, MD;  Location: [facility];  Service: Endoscopy;  Laterality: N/A;  7:30  . reversal tubal ligation    . TONSILLECTOMY    . TUBAL LIGATION       SOCIAL HISTORY:  Social History   Socioeconomic History  . Marital status: Married    Spouse name: Not on file  . Number of children: Not on file  . Years of education: Not on file  . Highest education level: Not on file  Occupational History  . Not on file  Social Needs  . Financial resource strain: Not on file  . Food insecurity:    Worry: Not on file    Inability: Not on file  . Transportation needs:    Medical: Not on file    Non-medical: Not on file  Tobacco Use  . Smoking status: Never Smoker  . Smokeless tobacco: Never Used  Substance and Sexual Activity  . Alcohol use: Never    Frequency: Never  . Drug use: Never  . Sexual activity: Not on file  Lifestyle  . Physical activity:    Days per week: Not on file    Minutes per session: Not on file  . Stress: Not on file  Relationships  . Social connections:    Talks on phone: Not on file    Gets together: Not on file    Attends religious service: Not on file    Active member of club or organization: Not on file    Attends meetings of clubs or organizations: Not on file    Relationship status: Not on file  . Intimate partner violence:    Fear of current or ex partner: Not on file    Emotionally abused: Not on file    Physically abused: Not on file    Forced sexual activity: Not on file  Other Topics Concern  . Not on file  Social History Narrative  . Not on file    FAMILY HISTORY:   Family History  Problem Relation Age of Onset  . Gastric cancer Paternal Grandfather   . Hypertension Mother   . Kidney cancer Father   . Stroke Father   . Heart attack Father   . Thyroid cancer Sister   . Hypertension Brother   . Breast cancer Maternal Grandmother   . Colon cancer Neg Hx     CURRENT MEDICATIONS:  Outpatient Encounter Medications as of 08/01/2018  Medication Sig  . acetaminophen (TYLENOL) 500 MG tablet Take 1 tablet (500 mg total) by mouth every 6 (six) hours as needed for moderate pain or headache.  Marland Kitchen amLODipine (NORVASC) 2.5 MG tablet Take 2.5 mg by mouth every Monday, Wednesday, and Friday.   Marland Kitchen amLODipine (NORVASC) 5 MG tablet Take 5 mg by mouth daily.  . benazepril (LOTENSIN) 10 MG tablet Take 10 mg by mouth daily.  . capecitabine (XELODA) 150 MG tablet Take 1 tablet (150 mg total) by mouth 2 (two) times daily after a meal. Total dose of 1150 mg twice daily.  Take along with two 500 mg Xeloda tablets.  . capecitabine (XELODA) 500 MG tablet Take 2 tablets (1,000 mg total) by mouth 2 (two) times daily after a meal.  . docusate sodium (COLACE) 100 MG capsule Take 1 capsule (100 mg total) by mouth 2 (two) times daily.  . fluconazole (DIFLUCAN) 150 MG tablet TAKE 1 TABLET BY MOUTH NOW REPEAT IN 7 DAYS IF NEEDED  . hydrochlorothiazide (MICROZIDE) 12.5 MG capsule Take 12.5 mg by mouth daily.  Marland Kitchen ibuprofen (ADVIL,MOTRIN) 200 MG tablet Take 200 mg by mouth every 6 (six) hours as needed for headache or moderate pain.  Marland Kitchen nystatin cream (MYCOSTATIN) APPLY TOPICALLY TWICE DAILY FOR 10 DAYS THEN AS NEEDED  . oxyCODONE (ROXICODONE) 5 MG immediate release tablet Take 1 tablet (5 mg total) by mouth every 4 (four) hours as needed for severe pain or breakthrough pain.  Marland Kitchen prochlorperazine (COMPAZINE) 10 MG tablet Take 1 tablet (10 mg total) by mouth every 6 (six) hours as needed for nausea or vomiting.  Marland Kitchen PROCTOZONE-HC 2.5 % rectal cream Place 1 application rectally 2 (two) times daily  as needed for hemorrhoids or anal itching.    No facility-administered encounter medications on file as of 08/01/2018.     ALLERGIES:  No Known Allergies   PHYSICAL EXAM:  ECOG Performance status: 1  VITAL SIGNS: BP 122/60, P:82, P: 18, TEMP: 97.9, SATS: 100%  Physical Exam HEENT: No thrush or mucositis. Chest: Bilateral clear to auscultation. CVS: S1-S2 regular rate and rhythm. Extremities: No edema or cyanosis.  Skin: No peeling of skin of hands or feet. LABORATORY DATA:  I have reviewed the labs as listed.  CBC    Component Value Date/Time   WBC 8.3 08/01/2018 0949   RBC 3.81 (L) 08/01/2018 0949   HGB 11.9 (L) 08/01/2018 0949   HCT 34.6 (L) 08/01/2018 0949   PLT 183 08/01/2018 0949   MCV 90.8 08/01/2018 0949   MCH 31.2 08/01/2018 0949   MCHC 34.4 08/01/2018 0949   RDW 14.3 08/01/2018 0949   LYMPHSABS 0.8 08/01/2018 0949   MONOABS 0.4 08/01/2018 0949   EOSABS 0.6 08/01/2018 0949   BASOSABS 0.0 08/01/2018 0949   CMP Latest Ref Rng & Units 08/01/2018 07/26/2018 07/19/2018  Glucose 70 - 99 mg/dL 103(H) 90 92  BUN 8 - 23 mg/dL 15 14 13   Creatinine 0.44 - 1.00 mg/dL 0.87 0.86 0.85  Sodium 135 - 145 mmol/L 138 139 139  Potassium 3.5 - 5.1 mmol/L 3.3(L) 4.2 3.7  Chloride 98 - 111 mmol/L 101 103 101  CO2 22 - 32 mmol/L 30 29 31   Calcium 8.9 - 10.3 mg/dL 9.2 9.3 9.0  Total Protein 6.5 - 8.1 g/dL 6.4(L) 6.2(L) 6.1(L)  Total Bilirubin 0.3 - 1.2 mg/dL 0.5 0.6 0.5  Alkaline Phos 38 - 126 U/L 68 58 59  AST 15 - 41 U/L 29 30 22   ALT 0 - 44 U/L 44 42 19        ASSESSMENT & PLAN:   Primary cancer of anal canal ([facility]) 1.  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  No inguinal lymphadenopathy palpable. -We discussed the results of CT scan of the chest, abdomen and pelvis which did not show any evidence of distant metastatic disease.  There is a very tiny questionable perirectal lymph node.  Not pathologically enlarged. - PET CT scan dated 06/06/2018  shows a 3 cm segment uptake in the anal canal region.  No pelvic adenopathy.  There is uptake in the left lobe of the thyroid.  We will order a thyroid ultrasound for follow-up.  I have also called and talked to Dr. Suzy Bouchard, radiologist who read the PET scan.  The small area of uptake in the left pelvis was thought to be secondary to ureter. - Day 1 of radiation with Xeloda 1150 mg twice daily Monday through Friday on 07/05/2018.  She is tolerating Xeloda and radiation very well.  She also received mitomycin day 1 on the same day.  Denies any major diarrhea.  She has only taken 3 tablets of Imodium so far.  Energy levels are great.  She may proceed with day 28 of mitomycin today.  This will complete her treatment with mitomycin.  She will finish radiation therapy on 08/17/2018.  I will see her back on 08/16/2018 with blood checks.  She will receive potassium 20 mEq today.  2.  Right breast lump: - CT scan showed incidental right breast lump.  Patient reports that she is aware of this for the last 25 years.  A needle  biopsy at that time was benign.  Serial mammograms did not show any growth.  3.  Left thyroid nodule: -This was incidental finding on PET CT scan.  Follow-up ultrasound confirmed this area.  We will get biopsy of this area once she finishes radiation and chemotherapy.      Orders placed this encounter:  Orders Placed This Encounter  Procedures  . CBC with Differential/Platelet  . Comprehensive  metabolic panel      Derek Jack, MD [place] [phone]

## 2018-08-01 NOTE — Patient Instructions (Signed)
[place] at [facility] Discharge Instructions  Follow on Aug 20th with labs that day.    Thank you for choosing [place] at [facility] to provide your oncology and hematology care.  To afford each patient quality time with our provider, please arrive at least 15 minutes before your scheduled appointment time.   If you have a lab appointment with the [facility] please come in thru the  Main Entrance and check in at the main information desk  You need to re-schedule your appointment should you arrive 10 or more minutes late.  We strive to give you quality time with our providers, and arriving late affects you and other patients whose appointments are after yours.  Also, if you no show three or more times for appointments you may be dismissed from the clinic at the providers discretion.     Again, thank you for choosing [facility].  Our hope is that these requests will decrease the amount of time that you wait before being seen by our physicians.       _____________________________________________________________  Should you have questions after your visit to [facility], please contact our office at [phone] between the hours of 8:00 a.m. and 4:30 p.m.  Voicemails left after 4:00 p.m. will not be returned until the following business day.  For prescription refill requests, have your pharmacy contact our office and allow 72 hours.    Cancer Center Support Programs:   > Cancer Support Group  2nd Tuesday of the month 1pm-2pm, Journey Room

## 2018-08-02 ENCOUNTER — Ambulatory Visit (HOSPITAL_COMMUNITY): Payer: BLUE CROSS/BLUE SHIELD | Admitting: Hematology

## 2018-08-02 ENCOUNTER — Inpatient Hospital Stay (HOSPITAL_COMMUNITY): Payer: BLUE CROSS/BLUE SHIELD

## 2018-08-16 ENCOUNTER — Encounter (HOSPITAL_COMMUNITY): Payer: Self-pay | Admitting: Hematology

## 2018-08-16 ENCOUNTER — Inpatient Hospital Stay (HOSPITAL_BASED_OUTPATIENT_CLINIC_OR_DEPARTMENT_OTHER): Payer: BLUE CROSS/BLUE SHIELD | Admitting: Hematology

## 2018-08-16 ENCOUNTER — Other Ambulatory Visit: Payer: Self-pay

## 2018-08-16 ENCOUNTER — Inpatient Hospital Stay (HOSPITAL_COMMUNITY): Payer: BLUE CROSS/BLUE SHIELD

## 2018-08-16 VITALS — BP 116/58 | HR 93 | Temp 98.7°F | Resp 16 | Wt 123.1 lb

## 2018-08-16 DIAGNOSIS — E079 Disorder of thyroid, unspecified: Secondary | ICD-10-CM | POA: Diagnosis not present

## 2018-08-16 DIAGNOSIS — R39198 Other difficulties with micturition: Secondary | ICD-10-CM

## 2018-08-16 DIAGNOSIS — R5383 Other fatigue: Secondary | ICD-10-CM | POA: Diagnosis not present

## 2018-08-16 DIAGNOSIS — C211 Malignant neoplasm of anal canal: Secondary | ICD-10-CM

## 2018-08-16 DIAGNOSIS — E041 Nontoxic single thyroid nodule: Secondary | ICD-10-CM

## 2018-08-16 DIAGNOSIS — Z5111 Encounter for antineoplastic chemotherapy: Secondary | ICD-10-CM | POA: Diagnosis not present

## 2018-08-16 LAB — CBC WITH DIFFERENTIAL/PLATELET
Basophils Absolute: 0 10*3/uL (ref 0.0–0.1)
Basophils Relative: 0 %
EOS PCT: 4 %
Eosinophils Absolute: 0.1 10*3/uL (ref 0.0–0.7)
HCT: 28.2 % — ABNORMAL LOW (ref 36.0–46.0)
Hemoglobin: 9.7 g/dL — ABNORMAL LOW (ref 12.0–15.0)
LYMPHS ABS: 0.4 10*3/uL — AB (ref 0.7–4.0)
LYMPHS PCT: 15 %
MCH: 31.7 pg (ref 26.0–34.0)
MCHC: 34.4 g/dL (ref 30.0–36.0)
MCV: 92.2 fL (ref 78.0–100.0)
MONO ABS: 0.5 10*3/uL (ref 0.1–1.0)
Monocytes Relative: 22 %
Neutro Abs: 1.4 10*3/uL — ABNORMAL LOW (ref 1.7–7.7)
Neutrophils Relative %: 59 %
PLATELETS: 120 10*3/uL — AB (ref 150–400)
RBC: 3.06 MIL/uL — ABNORMAL LOW (ref 3.87–5.11)
RDW: 16.5 % — AB (ref 11.5–15.5)
WBC: 2.4 10*3/uL — ABNORMAL LOW (ref 4.0–10.5)

## 2018-08-16 LAB — COMPREHENSIVE METABOLIC PANEL
ALT: 25 U/L (ref 0–44)
AST: 23 U/L (ref 15–41)
Albumin: 3.6 g/dL (ref 3.5–5.0)
Alkaline Phosphatase: 62 U/L (ref 38–126)
Anion gap: 8 (ref 5–15)
BILIRUBIN TOTAL: 0.9 mg/dL (ref 0.3–1.2)
BUN: 13 mg/dL (ref 8–23)
CHLORIDE: 94 mmol/L — AB (ref 98–111)
CO2: 32 mmol/L (ref 22–32)
CREATININE: 1.06 mg/dL — AB (ref 0.44–1.00)
Calcium: 9 mg/dL (ref 8.9–10.3)
GFR, EST NON AFRICAN AMERICAN: 55 mL/min — AB (ref >60)
Glucose, Bld: 128 mg/dL — ABNORMAL HIGH (ref 70–99)
POTASSIUM: 3.3 mmol/L — AB (ref 3.5–5.1)
Sodium: 134 mmol/L — ABNORMAL LOW (ref 135–145)
TOTAL PROTEIN: 5.8 g/dL — AB (ref 6.5–8.1)

## 2018-08-16 NOTE — Patient Instructions (Addendum)
[place] at [facility] Discharge Instructions  Follow up in 6-8 weeks    Thank you for choosing [place] at [facility] to provide your oncology and hematology care.  To afford each patient quality time with our provider, please arrive at least 15 minutes before your scheduled appointment time.   If you have a lab appointment with the [facility] please come in thru the  Main Entrance and check in at the main information desk  You need to re-schedule your appointment should you arrive 10 or more minutes late.  We strive to give you quality time with our providers, and arriving late affects you and other patients whose appointments are after yours.  Also, if you no show three or more times for appointments you may be dismissed from the clinic at the providers discretion.     Again, thank you for choosing [facility].  Our hope is that these requests will decrease the amount of time that you wait before being seen by our physicians.       _____________________________________________________________  Should you have questions after your visit to [facility], please contact our office at [phone] between the hours of 8:00 a.m. and 4:30 p.m.  Voicemails left after 4:00 p.m. will not be returned until the following business day.  For prescription refill requests, have your pharmacy contact our office and allow 72 hours.    Cancer Center Support Programs:   > Cancer Support Group  2nd Tuesday of the month 1pm-2pm, Journey Room

## 2018-08-16 NOTE — Assessment & Plan Note (Signed)
1.  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  No inguinal lymphadenopathy palpable. -We discussed the results of CT scan of the chest, abdomen and pelvis which did not show any evidence of distant metastatic disease.  There is a very tiny questionable perirectal lymph node.  Not pathologically enlarged. - PET CT scan dated 06/06/2018  shows a 3 cm segment uptake in the anal canal region.  No pelvic adenopathy.  There is uptake in the left lobe of the thyroid.  We will order a thyroid ultrasound for follow-up.  I have also called and talked to Dr. Suzy Bouchard, radiologist who read the PET scan.  The small area of uptake in the left pelvis was thought to be secondary to ureter. - Day 1 of radiation with Xeloda 1150 mg twice daily Monday through Friday on 07/05/2018.  She is tolerating Xeloda and radiation very well.  She received  28 mitomycin on 08/01/2018.  She will finish her radiation on 08/17/2018.  She was told to take Xeloda until tomorrow. -She has experienced more tiredness in the last couple of weeks.  I went over her blood work with her.  She was instructed to drink more fluids. - I will evaluate her in 8 to 10 weeks with a CT scan of the chest, abdomen and pelvis.  We will also do a digital rectal examination at that time.  I have informed her based on ACT-II trial, her disease may continue to regress even at 26 weeks from the start of treatment.  We will also arrange an anoscopy with  Dr. Laural Golden every 6 to 12 months for 3 years.  2.  Right breast lump: - CT scan showed incidental right breast lump.  Patient reports that she is aware of this for the last 25 years.  A needle biopsy at that time was benign.  Serial mammograms did not show any growth.  3.  Left thyroid nodule: -This was incidental finding on PET CT scan. Follow-up ultrasound confirmed this area.  We will get a biopsy of this lesion in 2 months.

## 2018-08-16 NOTE — Progress Notes (Signed)
Margaret Castaneda, [place] [address]   CLINIC:  Medical Oncology/Hematology  PCP:  Kennieth Rad, MD [facility] [address] [place] [address] [phone]   REASON FOR VISIT:  Follow-up for anal cancer  CURRENT THERAPY: chemoradiation mitomycin and xeloda   BRIEF ONCOLOGIC HISTORY:    Primary cancer of anal canal ([place])   06/02/2018 Initial Diagnosis    Primary cancer of anal canal ([place])    06/09/2018 -  Chemotherapy    The patient had ondansetron (ZOFRAN) 8 mg in sodium chloride 0.9 % 50 mL IVPB, , Intravenous,  Once, 1 of 1 cycle Administration:  (07/05/2018),  (08/01/2018) mitoMYcin (MUTAMYCIN) chemo injection 15.5 mg, 10 mg/m2 = 15.5 mg, Intravenous,  Once, 1 of 1 cycle Administration: 15.5 mg (07/05/2018), 15.5 mg (08/01/2018)  for chemotherapy treatment.        INTERVAL HISTORY:  Margaret Castaneda 62 y.o. female returns for routine follow-up anal cancer. Patient is here today with her husband. She only has complaints of fatigue throughout the day. She has her last radiation treatment on Wednesday. She will stop taking the Xeloda at this time as well. Patient has had some side effects from the radiation treatment. She has some bleeding coming from her urethra. States it is a very small amount. She has lidocaine to use for the pain. Patient reports her appetite at 75% and her energy level at 50%.  Patient denies any burning or peeling of hand or feet. Denies any mucositis. Denies any nausea, vomiting, or diarrhea.     REVIEW OF SYSTEMS:  Review of Systems  Constitutional: Positive for fatigue.  Genitourinary: Positive for difficulty urinating (due to pain of radiation).   Neurological: Positive for extremity weakness.  All other systems reviewed and are negative.    PAST MEDICAL/SURGICAL HISTORY:  Past Medical History:  Diagnosis Date  . Cancer ([place])    Anal Cancer  . Hypertension   . Rectal mass 05/18/2018   Past  Surgical History:  Procedure Laterality Date  . COLONOSCOPY N/A 05/24/2018   Procedure: COLONOSCOPY;  Surgeon: Rogene Houston, MD;  Location: [facility];  Service: Endoscopy;  Laterality: N/A;  7:30  . reversal tubal ligation    . TONSILLECTOMY    . TUBAL LIGATION       SOCIAL HISTORY:  Social History   Socioeconomic History  . Marital status: Married    Spouse name: Not on file  . Number of children: Not on file  . Years of education: Not on file  . Highest education level: Not on file  Occupational History  . Not on file  Social Needs  . Financial resource strain: Not on file  . Food insecurity:    Worry: Not on file    Inability: Not on file  . Transportation needs:    Medical: Not on file    Non-medical: Not on file  Tobacco Use  . Smoking status: Never Smoker  . Smokeless tobacco: Never Used  Substance and Sexual Activity  . Alcohol use: Never    Frequency: Never  . Drug use: Never  . Sexual activity: Not on file  Lifestyle  . Physical activity:    Days per week: Not on file    Minutes per session: Not on file  . Stress: Not on file  Relationships  . Social connections:    Talks on phone: Not on file    Gets together: Not on file    Attends  religious service: Not on file    Active member of club or organization: Not on file    Attends meetings of clubs or organizations: Not on file    Relationship status: Not on file  . Intimate partner violence:    Fear of current or ex partner: Not on file    Emotionally abused: Not on file    Physically abused: Not on file    Forced sexual activity: Not on file  Other Topics Concern  . Not on file  Social History Narrative  . Not on file    FAMILY HISTORY:  Family History  Problem Relation Age of Onset  . Gastric cancer Paternal Grandfather   . Hypertension Mother   . Kidney cancer Father   . Stroke Father   . Heart attack Father   . Thyroid cancer Sister   . Hypertension Brother   . Breast cancer  Maternal Grandmother   . Colon cancer Neg Hx     CURRENT MEDICATIONS:  Outpatient Encounter Medications as of 08/16/2018  Medication Sig  . acetaminophen (TYLENOL) 500 MG tablet Take 1 tablet (500 mg total) by mouth every 6 (six) hours as needed for moderate pain or headache.  Marland Kitchen amLODipine (NORVASC) 5 MG tablet Take 5 mg by mouth daily.  . benazepril (LOTENSIN) 10 MG tablet Take 10 mg by mouth daily.  . capecitabine (XELODA) 150 MG tablet Take 1 tablet (150 mg total) by mouth 2 (two) times daily after a meal. Total dose of 1150 mg twice daily.  Take along with two 500 mg Xeloda tablets.  . capecitabine (XELODA) 500 MG tablet Take 2 tablets (1,000 mg total) by mouth 2 (two) times daily after a meal.  . docusate sodium (COLACE) 100 MG capsule Take 1 capsule (100 mg total) by mouth 2 (two) times daily.  . fluconazole (DIFLUCAN) 150 MG tablet TAKE 1 TABLET BY MOUTH NOW REPEAT IN 7 DAYS IF NEEDED  . hydrochlorothiazide (HYDRODIURIL) 12.5 MG tablet   . hydrochlorothiazide (MICROZIDE) 12.5 MG capsule Take 12.5 mg by mouth daily.  Marland Kitchen HYDROcodone-acetaminophen (NORCO/VICODIN) 5-325 MG tablet TAKE ONE TABLET BY MOUTH EVERY 4 TO 6 HOURS AS NEEDED FOR PAIN  . ibuprofen (ADVIL,MOTRIN) 200 MG tablet Take 200 mg by mouth every 6 (six) hours as needed for headache or moderate pain.  Marland Kitchen lidocaine (LINDAMANTLE) 3 % CREA cream APPLY TO AFFECTED AREAS FOUR TIMES A DAY AS NEEDED FOR PAIN  . nystatin cream (MYCOSTATIN) APPLY TOPICALLY TWICE DAILY FOR 10 DAYS THEN AS NEEDED  . oxyCODONE (ROXICODONE) 5 MG immediate release tablet Take 1 tablet (5 mg total) by mouth every 4 (four) hours as needed for severe pain or breakthrough pain.  Marland Kitchen prochlorperazine (COMPAZINE) 10 MG tablet Take 1 tablet (10 mg total) by mouth every 6 (six) hours as needed for nausea or vomiting.  Marland Kitchen PROCTOZONE-HC 2.5 % rectal cream Place 1 application rectally 2 (two) times daily as needed for hemorrhoids or anal itching.   . [DISCONTINUED]  amLODipine (NORVASC) 2.5 MG tablet Take 2.5 mg by mouth every Monday, Wednesday, and Friday.    No facility-administered encounter medications on file as of 08/16/2018.     ALLERGIES:  No Known Allergies   PHYSICAL EXAM:  ECOG Performance status: 1  Vitals:   08/16/18 1428  BP: (!) 116/58  Pulse: 93  Resp: 16  Temp: 98.7 F (37.1 C)  SpO2: 96%   Filed Weights   08/16/18 1428  Weight: 123 lb 1.6 oz (55.8 kg)  Physical Exam  Constitutional: She is oriented to person, place, and time. She appears well-developed and well-nourished.  Cardiovascular: Normal rate, regular rhythm and normal heart sounds.  Pulmonary/Chest: Effort normal and breath sounds normal.  Neurological: She is alert and oriented to person, place, and time.  Skin: Skin is warm and dry.     LABORATORY DATA:  I have reviewed the labs as listed.  CBC    Component Value Date/Time   WBC 2.4 (L) 08/16/2018 1341   RBC 3.06 (L) 08/16/2018 1341   HGB 9.7 (L) 08/16/2018 1341   HCT 28.2 (L) 08/16/2018 1341   PLT 120 (L) 08/16/2018 1341   MCV 92.2 08/16/2018 1341   MCH 31.7 08/16/2018 1341   MCHC 34.4 08/16/2018 1341   RDW 16.5 (H) 08/16/2018 1341   LYMPHSABS 0.4 (L) 08/16/2018 1341   MONOABS 0.5 08/16/2018 1341   EOSABS 0.1 08/16/2018 1341   BASOSABS 0.0 08/16/2018 1341   CMP Latest Ref Rng & Units 08/16/2018 08/01/2018 07/26/2018  Glucose 70 - 99 mg/dL 128(H) 103(H) 90  BUN 8 - 23 mg/dL 13 15 14   Creatinine 0.44 - 1.00 mg/dL 1.06(H) 0.87 0.86  Sodium 135 - 145 mmol/L 134(L) 138 139  Potassium 3.5 - 5.1 mmol/L 3.3(L) 3.3(L) 4.2  Chloride 98 - 111 mmol/L 94(L) 101 103  CO2 22 - 32 mmol/L 32 30 29  Calcium 8.9 - 10.3 mg/dL 9.0 9.2 9.3  Total Protein 6.5 - 8.1 g/dL 5.8(L) 6.4(L) 6.2(L)  Total Bilirubin 0.3 - 1.2 mg/dL 0.9 0.5 0.6  Alkaline Phos 38 - 126 U/L 62 68 58  AST 15 - 41 U/L 23 29 30   ALT 0 - 44 U/L 25 44 42          ASSESSMENT & PLAN:   Primary cancer of anal canal ([facility]) 1.  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  No inguinal lymphadenopathy palpable. -We discussed the results of CT scan of the chest, abdomen and pelvis which did not show any evidence of distant metastatic disease.  There is a very tiny questionable perirectal lymph node.  Not pathologically enlarged. - PET CT scan dated 06/06/2018  shows a 3 cm segment uptake in the anal canal region.  No pelvic adenopathy.  There is uptake in the left lobe of the thyroid.  We will order a thyroid ultrasound for follow-up.  I have also called and talked to Dr. Suzy Bouchard, radiologist who read the PET scan.  The small area of uptake in the left pelvis was thought to be secondary to ureter. - Day 1 of radiation with Xeloda 1150 mg twice daily Monday through Friday on 07/05/2018.  She is tolerating Xeloda and radiation very well.  She received  28 mitomycin on 08/01/2018.  She will finish her radiation on 08/17/2018.  She was told to take Xeloda until tomorrow. -She has experienced more tiredness in the last couple of weeks.  I went over her blood work with her.  She was instructed to drink more fluids. -  I will evaluate her in 8 to 10 weeks with a CT scan of the chest, abdomen and pelvis.  We will also do a digital rectal examination at that time.  I have informed her based on ACT-II trial, her disease may continue to regress even at 26 weeks from the start of treatment.  We will also arrange an anoscopy with Dr. Laural Golden  every 6 to 12 months for 3 years.  2.  Right breast lump: - CT scan showed incidental right breast lump.  Patient reports that she is aware of this for the last 25 years.  A needle biopsy at that time was benign.  Serial mammograms did not show any growth.  3.  Left thyroid nodule: -This was incidental finding on PET CT scan. Follow-up ultrasound confirmed this area.  We will get a biopsy of this lesion in 2 months.      Orders placed this encounter:  Orders Placed This Encounter  Procedures  . CT Chest W Contrast  . CT Abdomen Pelvis W Contrast  . [place] FNA BX THYROID 1ST LESION AFIRMA  . CBC with Differential/Platelet  . Comprehensive metabolic panel      Derek Jack, MD [facility] [phone]

## 2018-08-23 ENCOUNTER — Other Ambulatory Visit (HOSPITAL_COMMUNITY): Payer: Self-pay | Admitting: *Deleted

## 2018-08-23 DIAGNOSIS — C211 Malignant neoplasm of anal canal: Secondary | ICD-10-CM

## 2018-08-24 ENCOUNTER — Inpatient Hospital Stay (HOSPITAL_COMMUNITY): Payer: BLUE CROSS/BLUE SHIELD | Attending: Hematology

## 2018-08-24 ENCOUNTER — Encounter (HOSPITAL_COMMUNITY): Payer: Self-pay | Admitting: Hematology

## 2018-08-24 ENCOUNTER — Inpatient Hospital Stay (HOSPITAL_COMMUNITY): Payer: BLUE CROSS/BLUE SHIELD

## 2018-08-24 ENCOUNTER — Inpatient Hospital Stay (HOSPITAL_COMMUNITY): Payer: BLUE CROSS/BLUE SHIELD | Attending: Hematology | Admitting: Hematology

## 2018-08-24 ENCOUNTER — Other Ambulatory Visit: Payer: Self-pay

## 2018-08-24 VITALS — BP 110/58 | HR 95 | Temp 98.7°F | Resp 18

## 2018-08-24 VITALS — BP 143/62 | HR 103 | Temp 98.5°F | Resp 18 | Wt 125.4 lb

## 2018-08-24 DIAGNOSIS — E041 Nontoxic single thyroid nodule: Secondary | ICD-10-CM

## 2018-08-24 DIAGNOSIS — C211 Malignant neoplasm of anal canal: Secondary | ICD-10-CM | POA: Diagnosis present

## 2018-08-24 DIAGNOSIS — R5383 Other fatigue: Secondary | ICD-10-CM | POA: Diagnosis not present

## 2018-08-24 DIAGNOSIS — R197 Diarrhea, unspecified: Secondary | ICD-10-CM

## 2018-08-24 DIAGNOSIS — R300 Dysuria: Secondary | ICD-10-CM | POA: Diagnosis not present

## 2018-08-24 DIAGNOSIS — Z5111 Encounter for antineoplastic chemotherapy: Secondary | ICD-10-CM | POA: Diagnosis not present

## 2018-08-24 DIAGNOSIS — R531 Weakness: Secondary | ICD-10-CM

## 2018-08-24 DIAGNOSIS — E079 Disorder of thyroid, unspecified: Secondary | ICD-10-CM | POA: Diagnosis not present

## 2018-08-24 DIAGNOSIS — E860 Dehydration: Secondary | ICD-10-CM

## 2018-08-24 LAB — CBC WITH DIFFERENTIAL/PLATELET
BASOS ABS: 0 10*3/uL (ref 0.0–0.1)
Basophils Relative: 0 %
EOS PCT: 0 %
Eosinophils Absolute: 0 10*3/uL (ref 0.0–0.7)
HCT: 26.4 % — ABNORMAL LOW (ref 36.0–46.0)
Hemoglobin: 9 g/dL — ABNORMAL LOW (ref 12.0–15.0)
Lymphocytes Relative: 7 %
Lymphs Abs: 0.3 10*3/uL — ABNORMAL LOW (ref 0.7–4.0)
MCH: 33 pg (ref 26.0–34.0)
MCHC: 34.1 g/dL (ref 30.0–36.0)
MCV: 96.7 fL (ref 78.0–100.0)
Monocytes Absolute: 0.8 10*3/uL (ref 0.1–1.0)
Monocytes Relative: 16 %
Neutro Abs: 3.9 10*3/uL (ref 1.7–7.7)
Neutrophils Relative %: 77 %
Platelets: 190 10*3/uL (ref 150–400)
RBC: 2.73 MIL/uL — AB (ref 3.87–5.11)
RDW: 21.1 % — AB (ref 11.5–15.5)
WBC: 5.1 10*3/uL (ref 4.0–10.5)

## 2018-08-24 LAB — COMPREHENSIVE METABOLIC PANEL
ALBUMIN: 3.3 g/dL — AB (ref 3.5–5.0)
ALT: 23 U/L (ref 0–44)
AST: 24 U/L (ref 15–41)
Alkaline Phosphatase: 72 U/L (ref 38–126)
Anion gap: 7 (ref 5–15)
BUN: 10 mg/dL (ref 8–23)
CHLORIDE: 102 mmol/L (ref 98–111)
CO2: 27 mmol/L (ref 22–32)
Calcium: 9.1 mg/dL (ref 8.9–10.3)
Creatinine, Ser: 0.89 mg/dL (ref 0.44–1.00)
GFR calc Af Amer: >60 mL/min (ref >60)
GFR calc non Af Amer: >60 mL/min (ref >60)
GLUCOSE: 106 mg/dL — AB (ref 70–99)
POTASSIUM: 4.5 mmol/L (ref 3.5–5.1)
Sodium: 136 mmol/L (ref 135–145)
Total Bilirubin: 0.6 mg/dL (ref 0.3–1.2)
Total Protein: 6.1 g/dL — ABNORMAL LOW (ref 6.5–8.1)

## 2018-08-24 MED ORDER — SODIUM CHLORIDE 0.9 % IV SOLN
Freq: Once | INTRAVENOUS | Status: AC
  Administered 2018-08-24: 11:00:00 via INTRAVENOUS
  Filled 2018-08-24: qty 1000

## 2018-08-24 MED ORDER — CIPROFLOXACIN HCL 500 MG PO TABS: 500.0000 mg | ORAL_TABLET | Freq: Two times a day (BID) | ORAL | 0 refills | Status: AC

## 2018-08-24 NOTE — Patient Instructions (Signed)
[facility] at [facility] Discharge Instructions     Thank you for choosing [facility] at [facility] to provide your oncology and hematology care.  To afford each patient quality time with our provider, please arrive at least 15 minutes before your scheduled appointment time.   If you have a lab appointment with the Cancer Center please come in thru the  Main Entrance and check in at the main information desk  You need to re-schedule your appointment should you arrive 10 or more minutes late.  We strive to give you quality time with our providers, and arriving late affects you and other patients whose appointments are after yours.  Also, if you no show three or more times for appointments you may be dismissed from the clinic at the providers discretion.     Again, thank you for choosing [facility].  Our hope is that these requests will decrease the amount of time that you wait before being seen by our physicians.       _____________________________________________________________  Should you have questions after your visit to [facility], please contact our office at [phone] between the hours of 8:00 a.m. and 4:30 p.m.  Voicemails left after 4:00 p.m. will not be returned until the following business day.  For prescription refill requests, have your pharmacy contact our office and allow 72 hours.    Cancer Center Support Programs:   > Cancer Support Group  2nd Tuesday of the month 1pm-2pm, Journey Room    

## 2018-08-24 NOTE — Progress Notes (Signed)
Margaret Castaneda tolerated hydration with magnesium and potassium well without complaints or incident. VSS upon discharge. Pt to continue her PO potassium 20 meq daily with lab recheck in 2 weeks per MD.Pt to have labs drawn at [facility] on Sept 10th. Orders to be faxed to [phone] per pt request. Pt discharged self ambulatory in satisfactory condition accompanied by family members

## 2018-08-24 NOTE — Patient Instructions (Signed)
[place] at [facility] Discharge Instructions  Received hydration of NS with potassium and magnesium. Follow-up as scheduled. Call clinic for any questions or concerns   Thank you for choosing [place] at [facility] to provide your oncology and hematology care.  To afford each patient quality time with our provider, please arrive at least 15 minutes before your scheduled appointment time.   If you have a lab appointment with the [facility] please come in thru the  Main Entrance and check in at the main information desk  You need to re-schedule your appointment should you arrive 10 or more minutes late.  We strive to give you quality time with our providers, and arriving late affects you and other patients whose appointments are after yours.  Also, if you no show three or more times for appointments you may be dismissed from the clinic at the providers discretion.     Again, thank you for choosing [facility].  Our hope is that these requests will decrease the amount of time that you wait before being seen by our physicians.       _____________________________________________________________  Should you have questions after your visit to [facility], please contact our office at [phone] between the hours of 8:00 a.m. and 4:30 p.m.  Voicemails left after 4:00 p.m. will not be returned until the following business day.  For prescription refill requests, have your pharmacy contact our office and allow 72 hours.    Cancer Center Support Programs:   > Cancer Support Group  2nd Tuesday of the month 1pm-2pm, Journey Room

## 2018-08-24 NOTE — Assessment & Plan Note (Addendum)
1.  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  No inguinal lymphadenopathy palpable. -We discussed the results of CT scan of the chest, abdomen and pelvis which did not show any evidence of distant metastatic disease.  There is a very tiny questionable perirectal lymph node.  Not pathologically enlarged. - PET CT scan dated 06/06/2018  shows a 3 cm segment uptake in the anal canal region.  No pelvic adenopathy.  There is uptake in the left lobe of the thyroid.  We will order a thyroid ultrasound for follow-up.  I have also called and talked to Dr. Suzy Bouchard, radiologist who read the PET scan.  The small area of uptake in the left pelvis was thought to be secondary to ureter. - Day 1 of radiation with Xeloda 1150 mg twice daily Monday through Friday on 07/05/2018.  She is tolerating Xeloda and radiation very well.  She received  28 mitomycin on 08/01/2018.  She finished radiation on 08/17/2018.  -She was evaluated in the ER at [place], [place] on 08/21/2018 for some weakness and shakiness in the lower extremities.  She was found to have hypokalemia and hyponatremia.  I have obtained and reviewed labs from that facility.  She was given some IV fluids and potassium.  She again went back there on 08/23/2018 with similar symptoms of shakiness in the legs.  She was again given IV fluids.  She felt better after the IV fluids. -Today she is slightly feeling weak.  Her lower extremity strength on  examination was normal.  Her blood counts are improving.  She has mild diarrhea on and off.  Potassium was 4.5.  She was told to cut back on potassium 20 mEq daily.  We will recheck electrolytes in the next 2 weeks to see if she can discontinue potassium.  She will receive IV fluids today along with magnesium. - I will evaluate her in 8 to 10 weeks with a CT scan of the chest, abdomen and pelvis.  We will also do a digital rectal examination at that time.  I have informed her based on ACT-II trial, her disease may continue to regress even at 26 weeks from the start of treatment.  We will also arrange an anoscopy with Dr. Laural Golden every 6 to 12 months for 3 years.  2.  Right breast lump: - CT scan showed incidental right breast lump.  Patient reports that she is aware of this for the last 25 years.  A needle biopsy at that time was benign.  Serial mammograms did not show any growth.  3.  Left thyroid nodule: -This was incidental finding on PET CT scan. Follow-up ultrasound confirmed this area.  We will get a biopsy of this lesion in 2 months.

## 2018-08-24 NOTE — Progress Notes (Signed)
[place], [place] [address]   CLINIC:  Medical Oncology/Hematology  PCP:  Kennieth Rad, MD [facility] [address] [place] [address] [phone]   REASON FOR VISIT:  Follow-up for anal cancer  CURRENT THERAPY: chemoradiation mitomycin and Xeloda  BRIEF ONCOLOGIC HISTORY:    Primary cancer of anal canal ([facility])   06/02/2018 Initial Diagnosis    Primary cancer of anal canal ([facility] [place])    06/09/2018 -  Chemotherapy    The patient had ondansetron (ZOFRAN) 8 mg in sodium chloride 0.9 % 50 mL IVPB, , Intravenous,  Once, 1 of 1 cycle Administration:  (07/05/2018),  (08/01/2018) mitoMYcin (MUTAMYCIN) chemo injection 15.5 mg, 10 mg/m2 = 15.5 mg, Intravenous,  Once, 1 of 1 cycle Administration: 15.5 mg (07/05/2018), 15.5 mg (08/01/2018)  for chemotherapy treatment.       INTERVAL HISTORY:  Margaret Castaneda 62 y.o. female returns for routine follow-up for anal cancer. Patient is follow up after two visits to the ER over the past few weeks. She has been feeling worse with more fatigue and weakness. She is having burning when urinating and has lidocaine she uses to help with that. She is having diarrhea almost daily 2-4 a day. Some thing they are watery and sometimes just loose. She has no appetite and is drinking the carnations daily to help supplement her and to maintain her weight. She denies any nausea or vomiting. Denies any numbness or tingling to hands or feet. Denies any new pains.     REVIEW OF SYSTEMS:  Review of Systems  Gastrointestinal: Positive for diarrhea.  Genitourinary: Positive for dysuria.   All other systems reviewed and are negative.    PAST MEDICAL/SURGICAL HISTORY:  Past Medical History:  Diagnosis Date  . Cancer ([facility])    Anal Cancer  . Hypertension   . Rectal mass 05/18/2018   Past Surgical History:  Procedure Laterality Date  . COLONOSCOPY N/A 05/24/2018   Procedure: COLONOSCOPY;  Surgeon: Rogene Houston,  MD;  Location[facility];  Service: Endoscopy;  Laterality: N/A;  7:30  . reversal tubal ligation    . TONSILLECTOMY    . TUBAL LIGATION       SOCIAL HISTORY:  Social History   Socioeconomic History  . Marital status: Married    Spouse name: Not on file  . Number of children: Not on file  . Years of education: Not on file  . Highest education level: Not on file  Occupational History  . Not on file  Social Needs  . Financial resource strain: Not on file  . Food insecurity:    Worry: Not on file    Inability: Not on file  . Transportation needs:    Medical: Not on file    Non-medical: Not on file  Tobacco Use  . Smoking status: Never Smoker  . Smokeless tobacco: Never Used  Substance and Sexual Activity  . Alcohol use: Never    Frequency: Never  . Drug use: Never  . Sexual activity: Not on file  Lifestyle  . Physical activity:    Days per week: Not on file    Minutes per session: Not on file  . Stress: Not on file  Relationships  . Social connections:    Talks on phone: Not on file    Gets together: Not on file    Attends religious service: Not on file    Active member of club or organization: Not on file  Attends meetings of clubs or organizations: Not on file    Relationship status: Not on file  . Intimate partner violence:    Fear of current or ex partner: Not on file    Emotionally abused: Not on file    Physically abused: Not on file    Forced sexual activity: Not on file  Other Topics Concern  . Not on file  Social History Narrative  . Not on file    FAMILY HISTORY:  Family History  Problem Relation Age of Onset  . Gastric cancer Paternal Grandfather   . Hypertension Mother   . Kidney cancer Father   . Stroke Father   . Heart attack Father   . Thyroid cancer Sister   . Hypertension Brother   . Breast cancer Maternal Grandmother   . Colon cancer Neg Hx     CURRENT MEDICATIONS:  Outpatient Encounter Medications as of 08/24/2018    Medication Sig  . acetaminophen (TYLENOL) 500 MG tablet Take 1 tablet (500 mg total) by mouth every 6 (six) hours as needed for moderate pain or headache.  Marland Kitchen amLODipine (NORVASC) 5 MG tablet Take 5 mg by mouth daily.  . benazepril (LOTENSIN) 10 MG tablet Take 10 mg by mouth daily.  . capecitabine (XELODA) 150 MG tablet Take 1 tablet (150 mg total) by mouth 2 (two) times daily after a meal. Total dose of 1150 mg twice daily.  Take along with two 500 mg Xeloda tablets.  . capecitabine (XELODA) 500 MG tablet Take 2 tablets (1,000 mg total) by mouth 2 (two) times daily after a meal.  . docusate sodium (COLACE) 100 MG capsule Take 1 capsule (100 mg total) by mouth 2 (two) times daily.  . fluconazole (DIFLUCAN) 150 MG tablet TAKE 1 TABLET BY MOUTH NOW REPEAT IN 7 DAYS IF NEEDED  . hydrochlorothiazide (HYDRODIURIL) 12.5 MG tablet   . hydrochlorothiazide (MICROZIDE) 12.5 MG capsule Take 12.5 mg by mouth daily.  Marland Kitchen HYDROcodone-acetaminophen (NORCO/VICODIN) 5-325 MG tablet TAKE ONE TABLET BY MOUTH EVERY 4 TO 6 HOURS AS NEEDED FOR PAIN  . ibuprofen (ADVIL,MOTRIN) 200 MG tablet Take 200 mg by mouth every 6 (six) hours as needed for headache or moderate pain.  Marland Kitchen lidocaine (LINDAMANTLE) 3 % CREA cream APPLY TO AFFECTED AREAS FOUR TIMES A DAY AS NEEDED FOR PAIN  . nystatin cream (MYCOSTATIN) APPLY TOPICALLY TWICE DAILY FOR 10 DAYS THEN AS NEEDED  . oxyCODONE (ROXICODONE) 5 MG immediate release tablet Take 1 tablet (5 mg total) by mouth every 4 (four) hours as needed for severe pain or breakthrough pain.  Marland Kitchen prochlorperazine (COMPAZINE) 10 MG tablet Take 1 tablet (10 mg total) by mouth every 6 (six) hours as needed for nausea or vomiting.  Marland Kitchen PROCTOZONE-HC 2.5 % rectal cream Place 1 application rectally 2 (two) times daily as needed for hemorrhoids or anal itching.   . ciprofloxacin (CIPRO) 500 MG tablet Take 1 tablet (500 mg total) by mouth 2 (two) times daily for 5 days.  . potassium chloride SA (K-DUR,KLOR-CON)  20 MEQ tablet    No facility-administered encounter medications on file as of 08/24/2018.     ALLERGIES:  No Known Allergies   PHYSICAL EXAM:  ECOG Performance status: 1  Vitals:   08/24/18 0928  BP: (!) 143/62  Pulse: (!) 103  Resp: 18  Temp: 98.5 F (36.9 C)  SpO2: 99%   Filed Weights   08/24/18 0928  Weight: 125 lb 6.4 oz (56.9 kg)    Physical Exam  Constitutional: She is oriented to person, place, and time. She appears well-developed and well-nourished.  Musculoskeletal: Normal range of motion.  Neurological: She is alert and oriented to person, place, and time.  Skin: Skin is warm and dry.  Lower extremity strength is 4 out of 5.   LABORATORY DATA:  I have reviewed the labs as listed.  CBC    Component Value Date/Time   WBC 5.1 08/24/2018 0846   RBC 2.73 (L) 08/24/2018 0846   HGB 9.0 (L) 08/24/2018 0846   HCT 26.4 (L) 08/24/2018 0846   PLT 190 08/24/2018 0846   MCV 96.7 08/24/2018 0846   MCH 33.0 08/24/2018 0846   MCHC 34.1 08/24/2018 0846   RDW 21.1 (H) 08/24/2018 0846   LYMPHSABS 0.3 (L) 08/24/2018 0846   MONOABS 0.8 08/24/2018 0846   EOSABS 0.0 08/24/2018 0846   BASOSABS 0.0 08/24/2018 0846   CMP Latest Ref Rng & Units 08/24/2018 08/16/2018 08/01/2018  Glucose 70 - 99 mg/dL 106(H) 128(H) 103(H)  BUN 8 - 23 mg/dL 10 13 15   Creatinine 0.44 - 1.00 mg/dL 0.89 1.06(H) 0.87  Sodium 135 - 145 mmol/L 136 134(L) 138  Potassium 3.5 - 5.1 mmol/L 4.5 3.3(L) 3.3(L)  Chloride 98 - 111 mmol/L 102 94(L) 101  CO2 22 - 32 mmol/L 27 32 30  Calcium 8.9 - 10.3 mg/dL 9.1 9.0 9.2  Total Protein 6.5 - 8.1 g/dL 6.1(L) 5.8(L) 6.4(L)  Total Bilirubin 0.3 - 1.2 mg/dL 0.6 0.9 0.5  Alkaline Phos 38 - 126 U/L 72 62 68  AST 15 - 41 U/L 24 23 29   ALT 0 - 44 U/L 23 25 44        ASSESSMENT & PLAN:   Primary cancer of anal canal ([facility]) 1.  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  No inguinal lymphadenopathy palpable. -We discussed the results of CT scan of the chest, abdomen and pelvis which did not show any evidence of distant metastatic disease.  There is a very tiny questionable perirectal lymph node.  Not pathologically enlarged. - PET CT scan dated 06/06/2018  shows a 3 cm segment uptake in the anal canal region.  No pelvic adenopathy.  There is uptake in the left lobe of the thyroid.  We will order a thyroid ultrasound for follow-up.  I have also called and talked to Dr. Suzy Bouchard, radiologist who read the PET scan.  The small area of uptake in the left pelvis was thought to be secondary to ureter. - Day 1 of radiation with Xeloda 1150 mg twice daily Monday through Friday on 07/05/2018.  She is tolerating Xeloda and radiation very well.  She received  28 mitomycin on 08/01/2018.  She finished radiation on 08/17/2018.  -She was evaluated in the ER at [facility], [place] on 08/21/2018 for some weakness and shakiness in the lower extremities.  She was found to have hypokalemia and hyponatremia.  I have obtained and reviewed labs from that facility.  She was given some IV fluids and potassium.  She  again went back there on 08/23/2018 with similar symptoms of shakiness in the legs.  She was again given IV fluids.  She felt better after the IV fluids. -Today she is slightly feeling weak.  Her lower extremity strength on examination was normal.  Her blood counts are improving.  She has mild diarrhea on and off.  Potassium was 4.5.  She was told to cut back on  potassium 20 mEq daily.  We will recheck electrolytes in the next 2 weeks to see if she can discontinue potassium.  She will receive IV fluids today along with magnesium. - I will evaluate her in 8 to 10 weeks with a CT scan of the chest, abdomen and pelvis.  We will also do a digital rectal examination at that time.  I have informed her based on ACT-II trial, her disease may continue to regress even at 26 weeks from the start of treatment.  We will also arrange an anoscopy with Dr. Laural Golden every 6 to 12 months for 3 years.  2.  Right breast lump: - CT scan showed incidental right breast lump.  Patient reports that she is aware of this for the last 25 years.  A needle biopsy at that time was benign.  Serial mammograms did not show any growth.  3.  Left thyroid nodule: -This was incidental finding on PET CT scan. Follow-up ultrasound confirmed this area.  We will get a biopsy of this lesion in 2 months.      Orders placed this encounter:  No orders of the defined types were placed in this encounter.     Derek Jack, MD [facility] [phone]

## 2018-09-05 ENCOUNTER — Other Ambulatory Visit (HOSPITAL_COMMUNITY): Payer: Self-pay

## 2018-09-05 DIAGNOSIS — C211 Malignant neoplasm of anal canal: Secondary | ICD-10-CM

## 2018-09-06 ENCOUNTER — Encounter: Payer: Self-pay | Admitting: Hematology

## 2018-09-07 ENCOUNTER — Telehealth (HOSPITAL_COMMUNITY): Payer: Self-pay

## 2018-09-07 NOTE — Telephone Encounter (Signed)
Pt called to find out if she needs to continue to take her Potassium daily. K+ results from 09/06/18 drawn at [facility] in [place] was 4.1. Wenda Low NP was informed of this result and pt can stop taking her Potassium pills and we will recheck her labs when she returns for MD appt per NP order. Pt informed of this information and verbalized understanding

## 2018-10-05 ENCOUNTER — Other Ambulatory Visit (HOSPITAL_COMMUNITY): Payer: Self-pay | Admitting: Nurse Practitioner

## 2018-10-05 ENCOUNTER — Ambulatory Visit (HOSPITAL_COMMUNITY)
Admission: RE | Admit: 2018-10-05 | Discharge: 2018-10-05 | Disposition: A | Discharge status: Home / Self Care | Payer: BLUE CROSS/BLUE SHIELD | Source: Ambulatory Visit | Attending: Nurse Practitioner | Admitting: Nurse Practitioner

## 2018-10-05 DIAGNOSIS — E041 Nontoxic single thyroid nodule: Secondary | ICD-10-CM

## 2018-10-05 NOTE — Progress Notes (Signed)
Patient ID: Margaret Castaneda, female   DOB: 1956/10/03, 62 y.o.   MRN: [id number]  Discussed thyroid biopsy with patient as well as results of thyroid US and PET-CT. Hypermetabolic7 mm LEFT thyroid nodule is small and adjacent to the LEFT common carotid artery. Recommend assessment of nodule by scintigraphy prior to consideration of biopsy; if nodule is hot on scintigraphy, it would represent a benign hyperfunctional adenoma and would not require biopsy.  If nodule is cold or inapparent by scintigraphy, then would proceed with consideration of biopsy. Discussed with patient and with Dr. Jamesetta So A. Thornton Papas, M.D.

## 2018-10-06 ENCOUNTER — Encounter (HOSPITAL_COMMUNITY)
Admission: RE | Admit: 2018-10-06 | Discharge: 2018-10-06 | Disposition: A | Discharge status: Home / Self Care | Payer: BLUE CROSS/BLUE SHIELD | Source: Ambulatory Visit | Attending: Nurse Practitioner | Admitting: Nurse Practitioner

## 2018-10-06 ENCOUNTER — Encounter (HOSPITAL_COMMUNITY): Payer: Self-pay

## 2018-10-06 DIAGNOSIS — E041 Nontoxic single thyroid nodule: Secondary | ICD-10-CM | POA: Diagnosis present

## 2018-10-06 MED ORDER — SODIUM IODIDE I-123 7.4 MBQ CAPS
442.0000 | ORAL_CAPSULE | Freq: Once | ORAL | Status: AC
  Administered 2018-10-06: 442 via ORAL

## 2018-10-07 ENCOUNTER — Encounter (HOSPITAL_COMMUNITY)
Admission: RE | Admit: 2018-10-07 | Discharge: 2018-10-07 | Disposition: A | Discharge status: Home / Self Care | Payer: BLUE CROSS/BLUE SHIELD | Source: Ambulatory Visit | Attending: Nurse Practitioner | Admitting: Nurse Practitioner

## 2018-10-07 ENCOUNTER — Telehealth (HOSPITAL_COMMUNITY): Payer: Self-pay

## 2018-10-07 NOTE — Telephone Encounter (Signed)
error 

## 2018-10-07 NOTE — Telephone Encounter (Signed)
Patient called asking for results of Thyroid scan.  Notified patient a telephone call note would be sent with understanding verbalized.  Telephone no. [phone]

## 2018-10-07 NOTE — Telephone Encounter (Signed)
Called Mrs. Stemmler with the message from the NP.  Patient will log into My Chart for the results.  Instructed the patient if she cannot review the results to call and the oncologist will be notified so the results will be released.  Verbalized understanding.

## 2018-10-07 NOTE — Telephone Encounter (Signed)
-----   Message from Glennie Isle, NP-C sent at 10/07/2018  3:02 PM EDT ----- Tell her she has an appointment on the 28th we will go over the results then. Or she can get onto Mychart and look at her results there as well but we will still go over results for her appointment  ----- Message ----- From: Penelope Galas, RN Sent: 10/07/2018   2:39 PM EDT To: Glennie Isle, NP-C  Patient called asking for results of Thyroid scan.  Notified patient a telephone call note would be sent with understanding verbalized.  Telephone no. [phone]

## 2018-10-10 ENCOUNTER — Telehealth (HOSPITAL_COMMUNITY): Payer: Self-pay

## 2018-10-10 NOTE — Telephone Encounter (Signed)
Returned pt's recent phone call regarding results of thyroid scan. Spoke with Dr. Delton Coombes and pt was informed of these results with possible thyroid bx needed. Pt to have further scans and labs tomorrow so Dr Raliegh Ip will review everything at her next appt on 10/28 per MD. Pt verbalized understanding

## 2018-10-11 ENCOUNTER — Ambulatory Visit (HOSPITAL_COMMUNITY)
Admission: RE | Admit: 2018-10-11 | Discharge: 2018-10-11 | Disposition: A | Discharge status: Home / Self Care | Payer: BLUE CROSS/BLUE SHIELD | Source: Ambulatory Visit | Attending: Nurse Practitioner | Admitting: Nurse Practitioner

## 2018-10-11 ENCOUNTER — Inpatient Hospital Stay (HOSPITAL_COMMUNITY): Payer: BLUE CROSS/BLUE SHIELD | Attending: Hematology

## 2018-10-11 DIAGNOSIS — C211 Malignant neoplasm of anal canal: Secondary | ICD-10-CM | POA: Diagnosis present

## 2018-10-11 DIAGNOSIS — N6489 Other specified disorders of breast: Secondary | ICD-10-CM | POA: Diagnosis not present

## 2018-10-11 DIAGNOSIS — K449 Diaphragmatic hernia without obstruction or gangrene: Secondary | ICD-10-CM | POA: Diagnosis not present

## 2018-10-11 LAB — COMPREHENSIVE METABOLIC PANEL
ALBUMIN: 3.6 g/dL (ref 3.5–5.0)
ALT: 16 U/L (ref 0–44)
ANION GAP: 7 (ref 5–15)
AST: 22 U/L (ref 15–41)
Alkaline Phosphatase: 64 U/L (ref 38–126)
BILIRUBIN TOTAL: 0.7 mg/dL (ref 0.3–1.2)
BUN: 15 mg/dL (ref 8–23)
CALCIUM: 9.4 mg/dL (ref 8.9–10.3)
CO2: 29 mmol/L (ref 22–32)
Chloride: 105 mmol/L (ref 98–111)
Creatinine, Ser: 1.01 mg/dL — ABNORMAL HIGH (ref 0.44–1.00)
GFR calc Af Amer: >60 mL/min (ref >60)
GFR calc non Af Amer: 58 mL/min — ABNORMAL LOW (ref >60)
GLUCOSE: 98 mg/dL (ref 70–99)
Potassium: 4.1 mmol/L (ref 3.5–5.1)
SODIUM: 141 mmol/L (ref 135–145)
Total Protein: 6.5 g/dL (ref 6.5–8.1)

## 2018-10-11 LAB — CBC WITH DIFFERENTIAL/PLATELET
Abs Immature Granulocytes: 0.02 10*3/uL (ref 0.00–0.07)
BASOS ABS: 0.1 10*3/uL (ref 0.0–0.1)
Basophils Relative: 1 %
Eosinophils Absolute: 0.3 10*3/uL (ref 0.0–0.5)
Eosinophils Relative: 5 %
HEMATOCRIT: 36.1 % (ref 36.0–46.0)
Hemoglobin: 11.4 g/dL — ABNORMAL LOW (ref 12.0–15.0)
IMMATURE GRANULOCYTES: 0 %
LYMPHS ABS: 0.8 10*3/uL (ref 0.7–4.0)
LYMPHS PCT: 14 %
MCH: 33.8 pg (ref 26.0–34.0)
MCHC: 31.6 g/dL (ref 30.0–36.0)
MCV: 107.1 fL — ABNORMAL HIGH (ref 80.0–100.0)
Monocytes Absolute: 0.7 10*3/uL (ref 0.1–1.0)
Monocytes Relative: 12 %
NEUTROS PCT: 68 %
NRBC: 0 % (ref 0.0–0.2)
Neutro Abs: 3.7 10*3/uL (ref 1.7–7.7)
Platelets: 227 10*3/uL (ref 150–400)
RBC: 3.37 MIL/uL — AB (ref 3.87–5.11)
RDW: 13.7 % (ref 11.5–15.5)
WBC: 5.4 10*3/uL (ref 4.0–10.5)

## 2018-10-11 MED ORDER — IOPAMIDOL (ISOVUE-300) INJECTION 61%
100.0000 mL | Freq: Once | INTRAVENOUS | Status: AC | PRN
  Administered 2018-10-11: 100 mL via INTRAVENOUS

## 2018-10-12 MED ORDER — IOHEXOL 300 MG/ML  SOLN
75.0000 mL | Freq: Once | INTRAMUSCULAR | Status: AC | PRN
  Administered 2018-10-12: 75 mL via INTRAVENOUS

## 2018-10-13 ENCOUNTER — Ambulatory Visit (HOSPITAL_COMMUNITY): Payer: BLUE CROSS/BLUE SHIELD | Admitting: Internal Medicine

## 2018-10-14 ENCOUNTER — Ambulatory Visit (HOSPITAL_COMMUNITY): Payer: BLUE CROSS/BLUE SHIELD | Admitting: Hematology

## 2018-10-18 ENCOUNTER — Ambulatory Visit (HOSPITAL_COMMUNITY): Payer: BLUE CROSS/BLUE SHIELD | Admitting: Hematology

## 2018-10-24 ENCOUNTER — Encounter (HOSPITAL_COMMUNITY): Payer: Self-pay | Admitting: Hematology

## 2018-10-24 ENCOUNTER — Inpatient Hospital Stay (HOSPITAL_COMMUNITY): Payer: BLUE CROSS/BLUE SHIELD | Attending: Hematology | Admitting: Hematology

## 2018-10-24 ENCOUNTER — Other Ambulatory Visit: Payer: Self-pay

## 2018-10-24 VITALS — BP 141/82 | HR 83 | Temp 98.1°F | Resp 16 | Wt 124.5 lb

## 2018-10-24 DIAGNOSIS — Z79899 Other long term (current) drug therapy: Secondary | ICD-10-CM | POA: Diagnosis not present

## 2018-10-24 DIAGNOSIS — E041 Nontoxic single thyroid nodule: Secondary | ICD-10-CM | POA: Insufficient documentation

## 2018-10-24 DIAGNOSIS — C211 Malignant neoplasm of anal canal: Secondary | ICD-10-CM

## 2018-10-24 DIAGNOSIS — H9192 Unspecified hearing loss, left ear: Secondary | ICD-10-CM | POA: Insufficient documentation

## 2018-10-24 NOTE — Progress Notes (Signed)
[place], [place] [address]   CLINIC:  Medical Oncology/Hematology  PCP:  Kennieth Rad, MD [facility] [address] [place] [address] [phone]   REASON FOR VISIT:  Follow-up for anal cancer  CURRENT THERAPY: Surveillance.  BRIEF ONCOLOGIC HISTORY:    Primary cancer of anal canal ([facility])   06/02/2018 Initial Diagnosis    Primary cancer of anal canal ([facility])    06/09/2018 -  Chemotherapy    The patient had ondansetron (ZOFRAN) 8 mg in sodium chloride 0.9 % 50 mL IVPB, , Intravenous,  Once, 1 of 1 cycle Administration:  (07/05/2018),  (08/01/2018) mitoMYcin (MUTAMYCIN) chemo injection 15.5 mg, 10 mg/m2 = 15.5 mg, Intravenous,  Once, 1 of 1 cycle Administration: 15.5 mg (07/05/2018), 15.5 mg (08/01/2018)  for chemotherapy treatment.       INTERVAL HISTORY:  Margaret Castaneda 62 y.o. female returns for follow-up of anal cancer.  She finished chemoradiation in August.  Most of her energy has come back.  She has 75% energy levels and 100% appetite.  She reportedly had an episode of vertigo a month ago.  She reported hearing loss in the left ear which has slightly improved at this time.  Denies any fevers or infections or recent hospitalizations.  No recent ER visits.    REVIEW OF SYSTEMS:  Review of Systems  Constitutional: Positive for fatigue.  Gastrointestinal: Negative for diarrhea.  Genitourinary: Negative for dysuria.   All other systems reviewed and are negative.    PAST MEDICAL/SURGICAL HISTORY:  Past Medical History:  Diagnosis Date  . Cancer ([facility])    Anal Cancer  . Hypertension   . Rectal mass 05/18/2018   Past Surgical History:  Procedure Laterality Date  . COLONOSCOPY N/A 05/24/2018   Procedure: COLONOSCOPY;  Surgeon: Rogene Houston, MD;  Location[facility];  Service: Endoscopy;  Laterality: N/A;  7:30  . reversal tubal ligation    . TONSILLECTOMY    . TUBAL LIGATION       SOCIAL HISTORY:  Social  History   Socioeconomic History  . Marital status: Married    Spouse name: Not on file  . Number of children: Not on file  . Years of education: Not on file  . Highest education level: Not on file  Occupational History  . Not on file  Social Needs  . Financial resource strain: Not on file  . Food insecurity:    Worry: Not on file    Inability: Not on file  . Transportation needs:    Medical: Not on file    Non-medical: Not on file  Tobacco Use  . Smoking status: Never Smoker  . Smokeless tobacco: Never Used  Substance and Sexual Activity  . Alcohol use: Never    Frequency: Never  . Drug use: Never  . Sexual activity: Not on file  Lifestyle  . Physical activity:    Days per week: Not on file    Minutes per session: Not on file  . Stress: Not on file  Relationships  . Social connections:    Talks on phone: Not on file    Gets together: Not on file    Attends religious service: Not on file    Active member of club or organization: Not on file    Attends meetings of clubs or organizations: Not on file    Relationship status: Not on file  . Intimate partner violence:    Fear of current or  ex partner: Not on file    Emotionally abused: Not on file    Physically abused: Not on file    Forced sexual activity: Not on file  Other Topics Concern  . Not on file  Social History Narrative  . Not on file    FAMILY HISTORY:  Family History  Problem Relation Age of Onset  . Gastric cancer Paternal Grandfather   . Hypertension Mother   . Kidney cancer Father   . Stroke Father   . Heart attack Father   . Thyroid cancer Sister   . Hypertension Brother   . Breast cancer Maternal Grandmother   . Colon cancer Neg Hx     CURRENT MEDICATIONS:  Outpatient Encounter Medications as of 10/24/2018  Medication Sig  . acetaminophen (TYLENOL) 500 MG tablet Take 1 tablet (500 mg total) by mouth every 6 (six) hours as needed for moderate pain or headache.  Marland Kitchen amLODipine (NORVASC) 5 MG  tablet Take 5 mg by mouth daily.  . benazepril (LOTENSIN) 10 MG tablet Take 10 mg by mouth daily.  Marland Kitchen docusate sodium (COLACE) 100 MG capsule Take 1 capsule (100 mg total) by mouth 2 (two) times daily.  . fluconazole (DIFLUCAN) 150 MG tablet TAKE 1 TABLET BY MOUTH NOW REPEAT IN 7 DAYS IF NEEDED  . hydrochlorothiazide (HYDRODIURIL) 12.5 MG tablet   . hydrochlorothiazide (MICROZIDE) 12.5 MG capsule Take 12.5 mg by mouth daily.  Marland Kitchen HYDROcodone-acetaminophen (NORCO/VICODIN) 5-325 MG tablet TAKE ONE TABLET BY MOUTH EVERY 4 TO 6 HOURS AS NEEDED FOR PAIN  . ibuprofen (ADVIL,MOTRIN) 200 MG tablet Take 200 mg by mouth every 6 (six) hours as needed for headache or moderate pain.  Marland Kitchen lidocaine (LINDAMANTLE) 3 % CREA cream APPLY TO AFFECTED AREAS FOUR TIMES A DAY AS NEEDED FOR PAIN  . nystatin cream (MYCOSTATIN) APPLY TOPICALLY TWICE DAILY FOR 10 DAYS THEN AS NEEDED  . oxyCODONE (ROXICODONE) 5 MG immediate release tablet Take 1 tablet (5 mg total) by mouth every 4 (four) hours as needed for severe pain or breakthrough pain.  . potassium chloride SA (K-DUR,KLOR-CON) 20 MEQ tablet   . PROCTOZONE-HC 2.5 % rectal cream Place 1 application rectally 2 (two) times daily as needed for hemorrhoids or anal itching.   . [DISCONTINUED] capecitabine (XELODA) 150 MG tablet Take 1 tablet (150 mg total) by mouth 2 (two) times daily after a meal. Total dose of 1150 mg twice daily.  Take along with two 500 mg Xeloda tablets. (Patient not taking: Reported on 10/24/2018)  . [DISCONTINUED] capecitabine (XELODA) 500 MG tablet Take 2 tablets (1,000 mg total) by mouth 2 (two) times daily after a meal. (Patient not taking: Reported on 10/24/2018)  . [DISCONTINUED] prochlorperazine (COMPAZINE) 10 MG tablet Take 1 tablet (10 mg total) by mouth every 6 (six) hours as needed for nausea or vomiting. (Patient not taking: Reported on 10/24/2018)   No facility-administered encounter medications on file as of 10/24/2018.     ALLERGIES:  No  Known Allergies   PHYSICAL EXAM:  ECOG Performance status: 1  Vitals:   10/24/18 0829  BP: (!) 141/82  Pulse: 83  Resp: 16  Temp: 98.1 F (36.7 C)  SpO2: 100%   Filed Weights   10/24/18 0829  Weight: 124 lb 8 oz (56.5 kg)    Physical Exam  Constitutional: She is oriented to person, place, and time. She appears well-developed and well-nourished.  Musculoskeletal: Normal range of motion.  Neurological: She is alert and oriented to person, place, and time.  Skin: Skin is warm and dry.  Lower extremity strength is 4 out of 5. Abdomen: Soft nontender with no palpable organomegaly. Rectal exam: Minor scar tissue in the anterior rectal wall where the cancer was previously.  LABORATORY DATA:  I have reviewed the labs as listed.  CBC    Component Value Date/Time   WBC 5.4 10/11/2018 0940   RBC 3.37 (L) 10/11/2018 0940   HGB 11.4 (L) 10/11/2018 0940   HCT 36.1 10/11/2018 0940   PLT 227 10/11/2018 0940   MCV 107.1 (H) 10/11/2018 0940   MCH 33.8 10/11/2018 0940   MCHC 31.6 10/11/2018 0940   RDW 13.7 10/11/2018 0940   LYMPHSABS 0.8 10/11/2018 0940   MONOABS 0.7 10/11/2018 0940   EOSABS 0.3 10/11/2018 0940   BASOSABS 0.1 10/11/2018 0940   CMP Latest Ref Rng & Units 10/11/2018 08/24/2018 08/16/2018  Glucose 70 - 99 mg/dL 98 106(H) 128(H)  BUN 8 - 23 mg/dL 15 10 13   Creatinine 0.44 - 1.00 mg/dL 1.01(H) 0.89 1.06(H)  Sodium 135 - 145 mmol/L 141 136 134(L)  Potassium 3.5 - 5.1 mmol/L 4.1 4.5 3.3(L)  Chloride 98 - 111 mmol/L 105 102 94(L)  CO2 22 - 32 mmol/L 29 27 32  Calcium 8.9 - 10.3 mg/dL 9.4 9.1 9.0  Total Protein 6.5 - 8.1 g/dL 6.5 6.1(L) 5.8(L)  Total Bilirubin 0.3 - 1.2 mg/dL 0.7 0.6 0.9  Alkaline Phos 38 - 126 U/L 64 72 62  AST 15 - 41 U/L 22 24 23   ALT 0 - 44 U/L 16 23 25      Radiology: I have independently reviewed images of the CT scan dated 10/12/2018 and discussed with patient.   ASSESSMENT & PLAN:   Primary cancer of anal canal ([place]) 1.  Stage II  (T2N0) squamous cell carcinoma of the anal canal: - Patient presented with 8 to 10 weeks of rectal pain and bleeding, status post colonoscopy on 05/24/2018 showing polypoid nonobstructing, non-circumferential, approximately 2 x 2 cm small mass found in the distal rectum, biopsy consistent with invasive squamous cell carcinoma, P 16 strongly positive - Had evaluation by GYN in January, Pap smear within normal limits - Occasional rectal pain, improved after colonoscopy, rectal examination today consistent with 2 to 3 cm mass in the anterior anal canal.  No inguinal lymphadenopathy palpable. -We discussed the results of CT scan of the chest, abdomen and pelvis which did not show any evidence of distant metastatic disease.  There is a very tiny questionable perirectal lymph node.  Not pathologically enlarged. - PET CT scan dated 06/06/2018  shows a 3 cm segment uptake in the anal canal region.  No pelvic adenopathy.  There is uptake in the left lobe of the thyroid.  We will order a thyroid ultrasound for follow-up.  I have also called and talked to Dr. Suzy Bouchard, radiologist who read the PET scan.  The small area of uptake in the left pelvis was thought to be secondary to ureter. - Radiation therapy with Xeloda and mitomycin from 07/05/2018 through 08/17/2018.  - We discussed the results of the CT CAP dated 10/12/2018 which did not show any evidence of metastatic disease.  Previously noted circumferential wall thickening and hyperenhancement in the anus is decreased.  No discrete residual anal mass. - Digital rectal exam today showed some scarring in the anterior anal wall at the site of tumor.  No bleeding. - We have discussed surveillance program in detail with DRE every 3 to 6 months for 5  years along with inguinal node palpation.  An anoscopy every 6 to 12 months for 3 years is also recommended.  CT scan of the chest, abdomen and pelvis with contrast is recommended annually for 3 years. - I will see her  back in 3 months for follow-up.  I have asked her to get a ENT evaluation for her hearing loss in the left ear of 1 month duration.  2.  Right breast lump: - CT scan showed incidental right breast lump.  Patient reports that she is aware of this for the last 25 years.  A needle biopsy at that time was benign.  Serial mammograms did not show any growth.  3.  Left thyroid nodule: -This was incidentally found on PET CT scan.  Thyroid uptake scan on 10/07/2018 shows a cold nodule. - I will order an ultrasound-guided biopsy of the left thyroid nodule.      Orders placed this encounter:  Orders Placed This Encounter  Procedures  . [place] [facility] [place], [place] [phone]

## 2018-10-24 NOTE — Assessment & Plan Note (Addendum)
1.  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  No inguinal lymphadenopathy palpable. -We discussed the results of CT scan of the chest, abdomen and pelvis which did not show any evidence of distant metastatic disease.  There is a very tiny questionable perirectal lymph node.  Not pathologically enlarged. - PET CT scan dated 06/06/2018  shows a 3 cm segment uptake in the anal canal region.  No pelvic adenopathy.  There is uptake in the left lobe of the thyroid.  We will order a thyroid ultrasound for follow-up.  I have also called and talked to Dr. Suzy Bouchard, radiologist who read the PET scan.  The small area of uptake in the left pelvis was thought to be secondary to ureter. - Radiation therapy with Xeloda and mitomycin from 07/05/2018 through 08/17/2018.  - We discussed the results of the CT CAP dated 10/12/2018 which did not show any evidence of metastatic disease.  Previously noted circumferential wall thickening and hyperenhancement in the anus is decreased.  No discrete residual anal mass. - Digital rectal exam today showed some scarring in the anterior anal wall at the site of tumor.  No bleeding. - We have discussed surveillance program in detail with DRE every 3 to 6 months for 5 years along with inguinal node palpation.  An anoscopy every 6 to 12 months for 3 years is also recommended.  CT scan of the chest, abdomen and pelvis with contrast is recommended annually for 3 years. - I  will see her back in 3 months for follow-up.  I have asked her to get a ENT evaluation for her hearing loss in the left ear of 1 month duration.  2.  Right breast lump: - CT scan showed incidental right breast lump.  Patient reports that she is aware of this for the last 25 years.  A needle biopsy at that time was benign.  Serial mammograms did not show any growth.  3.  Left thyroid nodule: -This was incidentally found on PET CT scan.  Thyroid uptake scan on 10/07/2018 shows a cold nodule. - I will order an ultrasound-guided biopsy of the left thyroid nodule.

## 2018-11-07 ENCOUNTER — Telehealth (HOSPITAL_COMMUNITY): Payer: Self-pay | Admitting: Surgery

## 2018-11-07 NOTE — Telephone Encounter (Signed)
Pt had called with new symptoms of numbness and tingling in her feet, worse in her left foot, and wanted to know if this was a normal side effect.  Per Dr. Raliegh Ip, this could be a side effect of her chemoradiation so no further action is needed at this time.  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

## 2018-11-16 ENCOUNTER — Ambulatory Visit
Admission: RE | Admit: 2018-11-16 | Discharge: 2018-11-16 | Disposition: A | Discharge status: Home / Self Care | Payer: BLUE CROSS/BLUE SHIELD | Source: Ambulatory Visit | Attending: Hematology | Admitting: Hematology

## 2018-11-16 ENCOUNTER — Other Ambulatory Visit (HOSPITAL_COMMUNITY)
Admission: RE | Admit: 2018-11-16 | Discharge: 2018-11-16 | Disposition: A | Discharge status: Home / Self Care | Payer: BLUE CROSS/BLUE SHIELD | Source: Ambulatory Visit | Attending: Radiology | Admitting: Radiology

## 2018-11-16 DIAGNOSIS — E041 Nontoxic single thyroid nodule: Secondary | ICD-10-CM | POA: Diagnosis present

## 2018-11-28 ENCOUNTER — Other Ambulatory Visit: Payer: Self-pay

## 2018-11-28 ENCOUNTER — Encounter (HOSPITAL_COMMUNITY): Payer: Self-pay | Admitting: Hematology

## 2018-11-28 ENCOUNTER — Inpatient Hospital Stay (HOSPITAL_COMMUNITY): Payer: BLUE CROSS/BLUE SHIELD | Attending: Hematology | Admitting: Hematology

## 2018-11-28 VITALS — BP 149/79 | HR 75 | Temp 99.0°F | Resp 16 | Wt 124.3 lb

## 2018-11-28 DIAGNOSIS — H9192 Unspecified hearing loss, left ear: Secondary | ICD-10-CM

## 2018-11-28 DIAGNOSIS — C73 Malignant neoplasm of thyroid gland: Secondary | ICD-10-CM

## 2018-11-28 DIAGNOSIS — G629 Polyneuropathy, unspecified: Secondary | ICD-10-CM

## 2018-11-28 DIAGNOSIS — C211 Malignant neoplasm of anal canal: Secondary | ICD-10-CM | POA: Diagnosis present

## 2018-11-28 DIAGNOSIS — R531 Weakness: Secondary | ICD-10-CM

## 2018-11-28 NOTE — Progress Notes (Signed)
Margaret Castaneda, [place] [id number]   CLINIC:  Medical Oncology/Hematology  PCP:  Kennieth Rad, MD [facility] [address] [place] [address] [phone]   REASON FOR VISIT: Follow-up for anal cancer  CURRENT THERAPY: Surveillance   BRIEF ONCOLOGIC HISTORY:    Primary cancer of anal canal ([place])   06/02/2018 Initial Diagnosis    Primary cancer of anal canal ([place])    06/09/2018 -  Chemotherapy    The patient had ondansetron (ZOFRAN) 8 mg in sodium chloride 0.9 % 50 mL IVPB, , Intravenous,  Once, 1 of 1 cycle Administration:  (07/05/2018),  (08/01/2018) mitoMYcin (MUTAMYCIN) chemo injection 15.5 mg, 10 mg/m2 = 15.5 mg, Intravenous,  Once, 1 of 1 cycle Administration: 15.5 mg (07/05/2018), 15.5 mg (08/01/2018)  for chemotherapy treatment.       INTERVAL HISTORY:  Margaret Castaneda 62 y.o. female returns for routine follow-up for anal cancer. She is here today with her husband. She is doing well. She does report she has weakness in her legs. 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 They put her on steroids and it has improved now she just has ringing. She denies any GI issues. Denies any bleeding or easy bruising. Denies any fevers or recent infections. Her appetite and energy are at 100%. She has no problem maintaining her weight at this time. She tries to remain active at home and performs all her own ADLs.     REVIEW OF SYSTEMS:  Review of Systems  Neurological: Positive for numbness.  All other systems reviewed and are negative.    PAST MEDICAL/SURGICAL HISTORY:  Past Medical History:  Diagnosis Date  . Cancer ([place])    Anal Cancer  . Hypertension   . Rectal mass 05/18/2018   Past Surgical History:  Procedure Laterality Date  . COLONOSCOPY N/A 05/24/2018   Procedure: COLONOSCOPY;  Surgeon: Rogene Houston, MD;  Location: [facility];  Service: Endoscopy;   Laterality: N/A;  7:30  . reversal tubal ligation    . TONSILLECTOMY    . TUBAL LIGATION       SOCIAL HISTORY:  Social History   Socioeconomic History  . Marital status: Married    Spouse name: Not on file  . Number of children: Not on file  . Years of education: Not on file  . Highest education level: Not on file  Occupational History  . Not on file  Social Needs  . Financial resource strain: Not on file  . Food insecurity:    Worry: Not on file    Inability: Not on file  . Transportation needs:    Medical: Not on file    Non-medical: Not on file  Tobacco Use  . Smoking status: Never Smoker  . Smokeless tobacco: Never Used  Substance and Sexual Activity  . Alcohol use: Never    Frequency: Never  . Drug use: Never  . Sexual activity: Not on file  Lifestyle  . Physical activity:    Days per week: Not on file    Minutes per session: Not on file  . Stress: Not on file  Relationships  . Social connections:    Talks on phone: Not on file    Gets together: Not on file    Attends religious service: Not on file    Active member of club or organization: Not on file    Attends  meetings of clubs or organizations: Not on file    Relationship status: Not on file  . Intimate partner violence:    Fear of current or ex partner: Not on file    Emotionally abused: Not on file    Physically abused: Not on file    Forced sexual activity: Not on file  Other Topics Concern  . Not on file  Social History Narrative  . Not on file    FAMILY HISTORY:  Family History  Problem Relation Age of Onset  . Gastric cancer Paternal Grandfather   . Hypertension Mother   . Kidney cancer Father   . Stroke Father   . Heart attack Father   . Thyroid cancer Sister   . Hypertension Brother   . Breast cancer Maternal Grandmother   . Colon cancer Neg Hx     CURRENT MEDICATIONS:  Outpatient Encounter Medications as of 11/28/2018  Medication Sig  . acetaminophen (TYLENOL) 500 MG tablet  Take 1 tablet (500 mg total) by mouth every 6 (six) hours as needed for moderate pain or headache.  Marland Kitchen amLODipine (NORVASC) 5 MG tablet Take 2.5 mg by mouth daily. Take 2.5mg  Monday, Wednesday, and Friday  . benazepril (LOTENSIN) 10 MG tablet Take 10 mg by mouth daily.  . hydrochlorothiazide (MICROZIDE) 12.5 MG capsule Take 12.5 mg by mouth daily.  Marland Kitchen ibuprofen (ADVIL,MOTRIN) 200 MG tablet Take 200 mg by mouth every 6 (six) hours as needed for headache or moderate pain.  . [DISCONTINUED] fluconazole (DIFLUCAN) 150 MG tablet TAKE 1 TABLET BY MOUTH NOW REPEAT IN 7 DAYS IF NEEDED  . [DISCONTINUED] hydrochlorothiazide (HYDRODIURIL) 12.5 MG tablet   . [DISCONTINUED] HYDROcodone-acetaminophen (NORCO/VICODIN) 5-325 MG tablet TAKE ONE TABLET BY MOUTH EVERY 4 TO 6 HOURS AS NEEDED FOR PAIN  . [DISCONTINUED] oxyCODONE (ROXICODONE) 5 MG immediate release tablet Take 1 tablet (5 mg total) by mouth every 4 (four) hours as needed for severe pain or breakthrough pain.  . [DISCONTINUED] potassium chloride SA (K-DUR,KLOR-CON) 20 MEQ tablet   . lidocaine (LINDAMANTLE) 3 % CREA cream APPLY TO AFFECTED AREAS FOUR TIMES A DAY AS NEEDED FOR PAIN  . nystatin cream (MYCOSTATIN) APPLY TOPICALLY TWICE DAILY FOR 10 DAYS THEN AS NEEDED  . PROCTOZONE-HC 2.5 % rectal cream Place 1 application rectally 2 (two) times daily as needed for hemorrhoids or anal itching.   . [DISCONTINUED] docusate sodium (COLACE) 100 MG capsule Take 1 capsule (100 mg total) by mouth 2 (two) times daily. (Patient not taking: Reported on 11/28/2018)   No facility-administered encounter medications on file as of 11/28/2018.     ALLERGIES:  No Known Allergies   PHYSICAL EXAM:  ECOG Performance status: 1  Vitals:   11/28/18 0823  BP: (!) 149/79  Pulse: 75  Resp: 16  Temp: 99 F (37.2 C)  SpO2: 100%   Filed Weights   11/28/18 0823  Weight: 124 lb 4.8 oz (56.4 kg)    Physical Exam  Constitutional: She is oriented to person, place, and time.  She appears well-developed and well-nourished.  Cardiovascular: Normal rate, regular rhythm and normal heart sounds.  Pulmonary/Chest: Effort normal and breath sounds normal.  Musculoskeletal: Normal range of motion.  Neurological: She is alert and oriented to person, place, and time.  Skin: Skin is warm and dry.  Psychiatric: She has a normal mood and affect. Her behavior is normal. Judgment and thought content normal.     LABORATORY DATA:  I have reviewed the labs as listed.  CBC  Component Value Date/Time   WBC 5.4 10/11/2018 0940   RBC 3.37 (L) 10/11/2018 0940   HGB 11.4 (L) 10/11/2018 0940   HCT 36.1 10/11/2018 0940   PLT 227 10/11/2018 0940   MCV 107.1 (H) 10/11/2018 0940   MCH 33.8 10/11/2018 0940   MCHC 31.6 10/11/2018 0940   RDW 13.7 10/11/2018 0940   LYMPHSABS 0.8 10/11/2018 0940   MONOABS 0.7 10/11/2018 0940   EOSABS 0.3 10/11/2018 0940   BASOSABS 0.1 10/11/2018 0940   CMP Latest Ref Rng & Units 10/11/2018 08/24/2018 08/16/2018  Glucose 70 - 99 mg/dL 98 106(H) 128(H)  BUN 8 - 23 mg/dL 15 10 13   Creatinine 0.44 - 1.00 mg/dL 1.01(H) 0.89 1.06(H)  Sodium 135 - 145 mmol/L 141 136 134(L)  Potassium 3.5 - 5.1 mmol/L 4.1 4.5 3.3(L)  Chloride 98 - 111 mmol/L 105 102 94(L)  CO2 22 - 32 mmol/L 29 27 32  Calcium 8.9 - 10.3 mg/dL 9.4 9.1 9.0  Total Protein 6.5 - 8.1 g/dL 6.5 6.1(L) 5.8(L)  Total Bilirubin 0.3 - 1.2 mg/dL 0.7 0.6 0.9  Alkaline Phos 38 - 126 U/L 64 72 62  AST 15 - 41 U/L 22 24 23   ALT 0 - 44 U/L 16 23 25        I have reviewed Francene Finders, NP's note and agree with the documentation.  I personally performed a face-to-face visit, made revisions and my assessment and plan is as follows.      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  Papillary thyroid carcinoma: -This was found as a incidental finding on PET CT scan for work-up of her anal cancer.  Ultrasound thyroid showed 0.7 cm left thyroid nodule. - Ultrasound-guided  biopsy on 11/16/2018 shows papillary carcinoma. - We will refer her to Dr. Arnoldo Morale for left thyroid lobectomy.  Primary cancer of anal canal ([place]) 1.  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  No inguinal lymphadenopathy palpable. -We discussed the results of CT scan of the chest, abdomen and pelvis which did not show any evidence of distant metastatic disease.  There is a very tiny questionable perirectal lymph node.  Not pathologically enlarged. - PET CT scan dated 06/06/2018  shows a 3 cm segment uptake in the anal canal region.  No pelvic adenopathy.  There is uptake in the left lobe of the thyroid.  We will order a thyroid ultrasound for follow-up.  I have also called and talked to Dr. Suzy Bouchard, radiologist who read the PET scan.  The small area of uptake in the left pelvis was thought to be secondary to ureter. - Radiation therapy with Xeloda and mitomycin from 07/05/2018 through 08/17/2018.  - We discussed the results of the CT CAP dated 10/12/2018 which did not show any evidence of metastatic disease.  Previously noted circumferential wall thickening and hyperenhancement in the anus is decreased.  No discrete residual anal mass. - Digital rectal exam today showed some scarring in the anterior anal wall at the site  of tumor.  No bleeding. - We have discussed surveillance program in detail with DRE every 3 to 6 months for 5 years along with inguinal node palpation.  An anoscopy  every 6 to 12 months for 3 years is also recommended.  CT scan of the chest, abdomen and pelvis with contrast is recommended annually for 3 years. - I will see her back in 3 months for follow-up.  We will also schedule her for PET CT scan. 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  She is having constant ringing in the ear.  Her hearing loss has mildly improved.  He was reportedly treated with steroids.  2.  Right breast lump: - CT scan showed incidental right breast lump.  Patient reports that she is aware of this for the last 25 years.  A needle biopsy at that time was benign.  Serial mammograms did not show any growth.        Orders placed this encounter:  Orders Placed This Encounter  Procedures  . NM PET Image Restag (PS) Skull Base To Thigh  . CBC with Differential/Platelet  . Comprehensive metabolic panel      Derek Jack, MD [place] [phone]

## 2018-11-28 NOTE — Assessment & Plan Note (Signed)
2.  Papillary thyroid carcinoma: -This was found as a incidental finding on PET CT scan for work-up of her anal cancer.  Ultrasound thyroid showed 0.7 cm left thyroid nodule. - Ultrasound-guided biopsy on 11/16/2018 shows papillary carcinoma. - We will refer her to Dr. Arnoldo Morale for left thyroid lobectomy.

## 2018-11-28 NOTE — Assessment & Plan Note (Addendum)
1.  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  No inguinal lymphadenopathy palpable. -We discussed the results of CT scan of the chest, abdomen and pelvis which did not show any evidence of distant metastatic disease.  There is a very tiny questionable perirectal lymph node.  Not pathologically enlarged. - PET CT scan dated 06/06/2018  shows a 3 cm segment uptake in the anal canal region.  No pelvic adenopathy.  There is uptake in the left lobe of the thyroid.  We will order a thyroid ultrasound for follow-up.  I have also called and talked to Dr. Suzy Bouchard, radiologist who read the PET scan.  The small area of uptake in the left pelvis was thought to be secondary to ureter. - Radiation therapy with Xeloda and mitomycin from 07/05/2018 through 08/17/2018.  - We discussed the results of the CT CAP dated 10/12/2018 which did not show any evidence of metastatic disease.  Previously noted circumferential wall thickening and hyperenhancement in the anus is decreased.  No discrete residual anal mass. - Digital rectal exam today showed some scarring in the anterior anal wall at the site of tumor.  No bleeding. - We have discussed surveillance program in detail with DRE every 3 to 6 months for 5 years along with inguinal node palpation.  An anoscopy every 6 to 12 months for 3 years is also recommended.  CT scan of the chest, abdomen and pelvis with contrast is recommended annually for 3 years. - I  will see her back in 3 months for follow-up.  We will also schedule her for PET CT scan. 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  She is having constant ringing in the ear.  Her hearing loss has mildly improved.  He was reportedly treated with steroids.  2.  Right breast lump: - CT scan showed incidental right breast lump.  Patient reports that she is aware of this for the last 25 years.  A needle biopsy at that time was benign.  Serial mammograms did not show any growth.

## 2018-11-28 NOTE — Patient Instructions (Signed)
[facility] Discharge Instructions     Thank you for choosing [facility] to provide your oncology and hematology care.  To afford each patient quality time with our provider, please arrive at least 15 minutes before your scheduled appointment time.   If you have a lab appointment with the Cancer Center please come in thru the  Main Entrance and check in at the main information desk  You need to re-schedule your appointment should you arrive 10 or more minutes late.  We strive to give you quality time with our providers, and arriving late affects you and other patients whose appointments are after yours.  Also, if you no show three or more times for appointments you may be dismissed from the clinic at the providers discretion.     Again, thank you for choosing [facility].  Our hope is that these requests will decrease the amount of time that you wait before being seen by our physicians.       _____________________________________________________________  Should you have questions after your visit to [facility], please contact our office at [phone] between the hours of 8:00 a.m. and 4:30 p.m.  Voicemails left after 4:00 p.m. will not be returned until the following business day.  For prescription refill requests, have your pharmacy contact our office and allow 72 hours.    Cancer Center Support Programs:   > Cancer Support Group  2nd Tuesday of the month 1pm-2pm, Journey Room    

## 2018-12-08 ENCOUNTER — Ambulatory Visit: Payer: BLUE CROSS/BLUE SHIELD | Admitting: General Surgery

## 2018-12-08 ENCOUNTER — Encounter: Payer: Self-pay | Admitting: General Surgery

## 2018-12-08 VITALS — BP 160/86 | HR 79 | Temp 97.5°F | Resp 20 | Wt 124.0 lb

## 2018-12-08 DIAGNOSIS — C73 Malignant neoplasm of thyroid gland: Secondary | ICD-10-CM

## 2018-12-08 NOTE — H&P (Signed)
Margaret Castaneda; [id number]; [birth year]   HPI Patient is a 62 year old white female who was referred to my care by Dr. 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  This was found incidentally on a PET scan.  Patient has been undergoing treatment for adenocarcinoma.  Patient denies any voice changes, dysphasia, heat intolerance.  Patient's sister has had a thyroidectomy for thyroid cancer.  No other cancers are known.  Patient has had a history of hypertension, but only has had one episode of increased blood pressure that was easily treated.  She currently has 0 out of 10 neck pain. Past Medical History:  Diagnosis Date  . Cancer ([place])    Anal Cancer  . Hypertension   . Rectal mass 05/18/2018    Past Surgical History:  Procedure Laterality Date  . COLONOSCOPY N/A 05/24/2018   Procedure: COLONOSCOPY;  Surgeon: Rogene Houston, MD;  Location[facility];  Service: Endoscopy;  Laterality: N/A;  7:30  . reversal tubal ligation    . TONSILLECTOMY    . TUBAL LIGATION      Family History  Problem Relation Age of Onset  . Gastric cancer Paternal Grandfather   . Hypertension Mother   . Kidney cancer Father   . Stroke Father   . Heart attack Father   . Thyroid cancer Sister   . Hypertension Brother   . Breast cancer Maternal Grandmother   . Colon cancer Neg Hx     Current Outpatient Medications on File Prior to Visit  Medication Sig Dispense Refill  . acetaminophen (TYLENOL) 500 MG tablet Take 1 tablet (500 mg total) by mouth every 6 (six) hours as needed for moderate pain or headache. 30 tablet 0  . amLODipine (NORVASC) 5 MG tablet Take 2.5 mg by mouth daily. Take 2.5mg  Monday, Wednesday, and Friday  4  . benazepril (LOTENSIN) 10 MG tablet Take 10 mg by mouth daily.    . hydrochlorothiazide (MICROZIDE) 12.5 MG capsule Take 12.5 mg by mouth daily.    Marland Kitchen ibuprofen (ADVIL,MOTRIN) 200 MG tablet Take 200 mg by mouth every 6 (six) hours  as needed for headache or moderate pain.    Marland Kitchen lidocaine (LINDAMANTLE) 3 % CREA cream APPLY TO AFFECTED AREAS FOUR TIMES A DAY AS NEEDED FOR PAIN  0  . nystatin cream (MYCOSTATIN) APPLY TOPICALLY TWICE DAILY FOR 10 DAYS THEN AS NEEDED  6  . PROCTOZONE-HC 2.5 % rectal cream Place 1 application rectally 2 (two) times daily as needed for hemorrhoids or anal itching.   0   No current facility-administered medications on file prior to visit.     No Known Allergies  Social History   Substance and Sexual Activity  Alcohol Use Never  . Frequency: Never    Social History   Tobacco Use  Smoking Status Never Smoker  Smokeless Tobacco Never Used    Review of Systems  Constitutional: Negative.   HENT: Negative.   Eyes: Negative.   Respiratory: Negative.   Cardiovascular: Negative.   Gastrointestinal: Negative.   Genitourinary: Negative.   Musculoskeletal: Negative.   Skin: Negative.   Neurological: Positive for sensory change.  Endo/Heme/Allergies: Negative.   Psychiatric/Behavioral: Negative.     Objective   Vitals:   12/08/18 0844  BP: (!) 160/86  Pulse: 79  Resp: 20  Temp: (!) 97.5 F (36.4 C)    Physical Exam Vitals signs reviewed.  Constitutional:      Appearance: Normal appearance. She is  normal weight. She is not ill-appearing.  HENT:     Head: Normocephalic and atraumatic.  Neck:     Musculoskeletal: Normal range of motion and neck supple. No neck rigidity or muscular tenderness.     Comments: No thyromegaly.  Difficult to discern the left thyroid nodule. Cardiovascular:     Rate and Rhythm: Normal rate and regular rhythm.     Heart sounds: No murmur. No gallop.   Pulmonary:     Effort: Pulmonary effort is normal. No respiratory distress.     Breath sounds: Normal breath sounds. No stridor. No wheezing or rales.  Lymphadenopathy:     Cervical: No cervical adenopathy.  Skin:    General: Skin is warm and dry.  Neurological:     Mental Status: She is alert  and oriented to person, place, and time.    PET scan report reviewed.  No adrenal mass. 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  The risks and benefits of the procedure including bleeding, infection, nerve injury, and voice changes were fully explained to the patient, who gave informed consent.  She does realize that she will be on thyroid supplementation after the surgery.

## 2018-12-08 NOTE — Patient Instructions (Signed)
Thyroidectomy A thyroidectomy is a surgery that is done to remove all (total thyroidectomy) or part (subtotal thyroidectomy) of your thyroid gland. The thyroid is a butterfly-shaped gland that is located at the lower front of your neck. It produces a substance that helps to control certain body processes (thyroid hormone). The amount of thyroid gland tissue that is removed during your thyroidectomy depends on the reason you need the procedure. You may have a thyroidectomy to treat conditions including:  Thyroid nodules.  Thyroid cancer.  Benign thyroid tumors.  Goiter.  Overactive thyroid gland (hyperthyroidism).  There are different ways to do a thyroidectomy:  Conventional thyroidectomy (open thyroidectomy). This procedure is the most common. In this procedure, the thyroid gland is removed through one surgical cut (incision) in the neck.  Endoscopic thyroidectomy. This procedure is less invasive. In this procedure, there may be several smaller incisions in the neck, chest, or armpit. The surgeon uses a tiny camera and other assistive tools to remove the thyroid gland.  Tell a health care provider about:  Any allergies you have.  All medicines you are taking, including vitamins, herbs, eye drops, creams, and over-the-counter medicines.  Any problems you or family members have had with anesthetic medicines.  Any blood disorders you have.  Any surgeries you have had.  Any medical conditions you have. What are the risks? Generally, this is a safe procedure. However, problems can occur and include:  A decrease in parathyroid hormone levels (hypoparathyroidism). Your parathyroid glands are located behind your thyroid gland, and they maintain the calcium level in your body. If these glands are damaged during surgery, your calcium level will drop. This will make your nerves irritable and cause muscle spasms.  An increase in thyroid hormone.  Damage to the nerves of your voice box  (larynx).  Bleeding.  Infection at the site of the incision or incisions.  Temporary breathing difficulties. This is a very rare complication. It usually goes away within weeks.  What happens before the procedure?  Your health care provider will perform a physical exam and assess your voice for vocal changes.  Ask your health care provider about: ? Changing or stopping your regular medicines. This is especially important if you are taking diabetes medicines or blood thinners. ? Taking medicines such as aspirin and ibuprofen. These medicines can thin your blood. Do not take these medicines before your procedure if your health care provider instructs you not to.  Follow instructions from your health care provider about eating or drinking restrictions. What happens during the procedure? You will be given a medicine that makes you go to sleep (general anesthetic). Depending on which type of thyroidectomy you have, this is what may happen during the procedure: Conventional Thyroidectomy  The surgeon will make an incision in the center of your lower neck.  The muscles in your neck will be separated to reveal your thyroid gland.  Part or all of your thyroid gland will be removed.  You may need a tube (catheter) at the incision site to drain blood and fluids that accumulate under the skin after the procedure.The catheter may have to stay in place for a day or two after the procedure.  The incision will be closed with stitches (sutures). Endoscopic Thyroidectomy  The surgeon will make several small incisions in your neck, chest, or armpit.  The surgeon will use a narrow tube with a light and camera at the end (endoscope). The surgeon will insert the endoscope into an incision.  Part or  all of your thyroid gland will be removed.  You may need a catheter at the incision site to drain blood and fluids that accumulate under the skin after the procedure.The catheter may have to stay in  place for a day or two after the procedure.  The incision will be closed with sutures. What happens after the procedure?  Your blood pressure, heart rate, breathing rate, and blood oxygen level will be monitored often until the medicines you were given have worn off.  Depending on the type of thyroidectomy you had, you may have: ? A swollen neck. ? Some mild neck pain. ? A slightly sore throat. ? A weak voice.  You will not be able to eat or drink until your health care provider says it is okay.  You may have a blood test to check the level of calcium in your body.  If you had a catheter put in during the procedure, it will usually be removed the next day. This information is not intended to replace advice given to you by your health care provider. Make sure you discuss any questions you have with your health care provider. Document Released: 06/09/2001 Document Revised: 08/16/2016 Document Reviewed: 05/16/2014 Elsevier Interactive Patient Education  2018 [employer]. Thyroid Cancer The thyroid is a butterfly-shaped gland in the neck. The thyroid makes hormones that control functions like heart rate, blood pressure, temperature, and weight. Thyroid cancer is an abnormal growth of cells in the thyroid. These cells grow and multiply rapidly and do not die as normal cells do. There are four main types of thyroid cancer:  Papillary cancer. This is the least harmful type of thyroid cancer. It typically affects women of child-bearing age.  Follicular cancer. This type of cancer is the most likely to come back after treatment and spread to other parts of the body.  Medullary cancer. Medullary cancer can be passed down through families. A person who inherits the gene for this cancer is at high risk for developing the cancer.  Anaplastic cancer. This type of thyroid cancer spreads quickly to the windpipe (trachea) and causes breathing problems. This type of cancer is most common in people 34  years of age or older.  What are the causes? The cause of thyroid cancer is not known. What increases the risk? Risk factors for thyroid cancer include:  Radiation exposure or radiation treatments to your head and neck during infancy or childhood.  Having an enlarged thyroid.  Having a family history of thyroid disease or inheriting family conditions.  Being female.  Being Asian.  What are the signs or symptoms? Symptoms of thyroid cancer may include:  Larger-than-normal thyroid gland.  Lumps or swelling in your neck.  Hoarseness or a change in your voice.  A cough.  Coughing up blood.  Difficulty swallowing.  Shortness of breath.  How is this diagnosed? Your health care provider will examine your thyroid during a physical exam. He or she may order tests such as:  An imaging study using sound waves and a computer (ultrasound).  Blood tests.  A tissue sample test (biopsy).  Other tests may be ordered to see if your cancer has spread. These tests may include CT, MRI, or PET scans. Your health care provider may see if you have any genetic changes that may put you at risk for other types of cancer. How is this treated? Most thyroid cancers are treated with a surgery to remove most or all of the thyroid gland (thyroidectomy). In some  cases, treatment also involves removing the lymph nodes in the neck that are close to the thyroid. After surgery, you may have:  Thyroid hormone therapy. The therapy is done to: ? Replace the hormone in the body that is normally made by the thyroid. ? Suppress a thyroid-stimulating hormone (TSH) that activates the thyroid and makes any remaining cancer cells grow.  Radioactive iodine treatment. This treatment is often used to destroy any remaining thyroid tissue and cancerous tissue that could not be seen and was not removed during the thyroidectomy. This treatment may also be used to treat thyroid cancer that has spread to other parts of  the body or has recurred.  External radiation. This is typically done to treat thyroid cancer that has spread to the bones.  Chemotherapy treatment. This is medicine that kills cancer cells and prevents them from growing.  Alcohol ablation. This kills cancer cells by injecting alcohol into the cells.  Biologic therapy or immunotherapy. This uses the body's own immune system to fight cancer cells.  Follow these instructions at home:  Take medicines only as directed by your health care provider.  Keep all follow-up visits as directed by your health care provider. This is important. Contact a health care provider if:  You feel nauseous or you vomit.  You have diarrhea.  You have a rash.  You have problems with urinating, such as: ? Having a burning sensation. ? Needing to urinate more often than usual. ? Pain or difficulty urinating. ? Having blood in your urine.  You have a new cough.  You have symptoms of too much thyroid hormone, such as: ? Nervousness or anxiety. ? Unintentional weight loss. ? Sweating. ? Difficulty sleeping. ? Hair loss. ? Heart palpitations. ? Frequent bowel movements.  You have symptoms of too little thyroid hormone, such as: ? Fatigue. ? Puffiness in your face, hands, or feet. ? Unintentional weight gain. ? Feeling cold. ? Constipation.  You have a fever. Get help right away if:  You have chest pain.  You have shortness of breath.  You suddenly feel too weak or dizzy to stand or walk. This information is not intended to replace advice given to you by your health care provider. Make sure you discuss any questions you have with your health care provider. Document Released: 11/06/2004 Document Revised: 08/16/2016 Document Reviewed: 05/17/2014 Elsevier Interactive Patient Education  2018 [employer].

## 2018-12-08 NOTE — Progress Notes (Signed)
Margaret Castaneda; [id number]; [birth year]   HPI Patient is a 62 year old white female who was referred to my care by Dr. 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  This was found incidentally on a PET scan.  Patient has been undergoing treatment for adenocarcinoma.  Patient denies any voice changes, dysphasia, heat intolerance.  Patient's sister has had a thyroidectomy for thyroid cancer.  No other cancers are known.  Patient has had a history of hypertension, but only has had one episode of increased blood pressure that was easily treated.  She currently has 0 out of 10 neck pain. Past Medical History:  Diagnosis Date  . Cancer ([place])    Anal Cancer  . Hypertension   . Rectal mass 05/18/2018    Past Surgical History:  Procedure Laterality Date  . COLONOSCOPY N/A 05/24/2018   Procedure: COLONOSCOPY;  Surgeon: Rogene Houston, MD;  Location: [facility];  Service: Endoscopy;  Laterality: N/A;  7:30  . reversal tubal ligation    . TONSILLECTOMY    . TUBAL LIGATION      Family History  Problem Relation Age of Onset  . Gastric cancer Paternal Grandfather   . Hypertension Mother   . Kidney cancer Father   . Stroke Father   . Heart attack Father   . Thyroid cancer Sister   . Hypertension Brother   . Breast cancer Maternal Grandmother   . Colon cancer Neg Hx     Current Outpatient Medications on File Prior to Visit  Medication Sig Dispense Refill  . acetaminophen (TYLENOL) 500 MG tablet Take 1 tablet (500 mg total) by mouth every 6 (six) hours as needed for moderate pain or headache. 30 tablet 0  . amLODipine (NORVASC) 5 MG tablet Take 2.5 mg by mouth daily. Take 2.5mg  Monday, Wednesday, and Friday  4  . benazepril (LOTENSIN) 10 MG tablet Take 10 mg by mouth daily.    . hydrochlorothiazide (MICROZIDE) 12.5 MG capsule Take 12.5 mg by mouth daily.    Marland Kitchen ibuprofen (ADVIL,MOTRIN) 200 MG tablet Take 200 mg by mouth every 6 (six) hours  as needed for headache or moderate pain.    Marland Kitchen lidocaine (LINDAMANTLE) 3 % CREA cream APPLY TO AFFECTED AREAS FOUR TIMES A DAY AS NEEDED FOR PAIN  0  . nystatin cream (MYCOSTATIN) APPLY TOPICALLY TWICE DAILY FOR 10 DAYS THEN AS NEEDED  6  . PROCTOZONE-HC 2.5 % rectal cream Place 1 application rectally 2 (two) times daily as needed for hemorrhoids or anal itching.   0   No current facility-administered medications on file prior to visit.     No Known Allergies  Social History   Substance and Sexual Activity  Alcohol Use Never  . Frequency: Never    Social History   Tobacco Use  Smoking Status Never Smoker  Smokeless Tobacco Never Used    Review of Systems  Constitutional: Negative.   HENT: Negative.   Eyes: Negative.   Respiratory: Negative.   Cardiovascular: Negative.   Gastrointestinal: Negative.   Genitourinary: Negative.   Musculoskeletal: Negative.   Skin: Negative.   Neurological: Positive for sensory change.  Endo/Heme/Allergies: Negative.   Psychiatric/Behavioral: Negative.     Objective   Vitals:   12/08/18 0844  BP: (!) 160/86  Pulse: 79  Resp: 20  Temp: (!) 97.5 F (36.4 C)    Physical Exam Vitals signs reviewed.  Constitutional:      Appearance: Normal appearance. She is  normal weight. She is not ill-appearing.  HENT:     Head: Normocephalic and atraumatic.  Neck:     Musculoskeletal: Normal range of motion and neck supple. No neck rigidity or muscular tenderness.     Comments: No thyromegaly.  Difficult to discern the left thyroid nodule. Cardiovascular:     Rate and Rhythm: Normal rate and regular rhythm.     Heart sounds: No murmur. No gallop.   Pulmonary:     Effort: Pulmonary effort is normal. No respiratory distress.     Breath sounds: Normal breath sounds. No stridor. No wheezing or rales.  Lymphadenopathy:     Cervical: No cervical adenopathy.  Skin:    General: Skin is warm and dry.  Neurological:     Mental Status: She is alert  and oriented to person, place, and time.    PET scan report reviewed.  No adrenal mass. 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  The risks and benefits of the procedure including bleeding, infection, nerve injury, and voice changes were fully explained to the patient, who gave informed consent.  She does realize that she will be on thyroid supplementation after the surgery.

## 2018-12-23 NOTE — Patient Instructions (Signed)
Margaret Castaneda  12/23/2018     @PREFPERIOPPHARMACY @   Your procedure is scheduled on  01/04/2019.  Report to [facility] at  615  A.M.  Call this number if you have problems the morning of surgery:  [phone]   Remember:  Do not eat or drink after midnight.                         Take these medicines the morning of surgery with A SIP OF WATER  Amlodipine, lotensin, microzide, antivert, zantac.    Do not wear jewelry, make-up or nail polish.  Do not wear lotions, powders, or perfumes, or deodorant.  Do not shave 48 hours prior to surgery.  Men may shave face and neck.  Do not bring valuables to the hospital.  [facility] is not responsible for any belongings or valuables.  Contacts, dentures or bridgework may not be worn into surgery.  Leave your suitcase in the car.  After surgery it may be brought to your room.  For patients admitted to the hospital, discharge time will be determined by your treatment team.  Patients discharged the day of surgery will not be allowed to drive home.   Name and phone number of your driver:   family Special instructions:  None  Please read over the following fact sheets that you were given. Pain Booklet, Coughing and Deep Breathing, Anesthesia Post-op Instructions and Care and Recovery After Surgery       Thyroidectomy A thyroidectomy is a surgery that is done to remove the thyroid gland. The thyroid is a butterfly-shaped gland that is located at the lower front of your neck. It produces thyroid hormone, which is a substance that helps to control certain body processes. You may have a:  Total thyroidectomy. All of your thyroid is removed.  Thyroid lobectomy. Part of your thyroid is removed. The amount of thyroid gland tissue that is removed during your surgery depends on the reason for the procedure. Reasons to have this procedure include treatment for:  Thyroid nodules.  Thyroid cancer.  Benign thyroid  tumors.  Goiter.  Overactive thyroid gland (hyperthyroidism). There are two ways to do this procedure. Conventional, or open, thyroidectomy uses one large incision to remove the thyroid gland. This is the most common method. Endoscopic thyroidectomy, a less invasive method, uses a narrow tube with a light and camera (endoscope) to remove the gland. Tell a health care provider about:  Any allergies you have.  All medicines you are taking, including vitamins, herbs, eye drops, creams, and over-the-counter medicines.  Any problems you or family members have had with anesthetic medicines.  Any blood disorders you have.  Any surgeries you have had.  Any medical conditions you have.  Whether you are pregnant or may be pregnant. What are the risks? Generally, this is a safe procedure. However, problems may occur, including:  Damage to the parathyroid glands. These are located behind your thyroid gland. They maintain the calcium levels in the body. Damage may lead to: ? A decrease in parathyroid hormone levels (hypoparathyroidism). ? A decrease in calcium levels. This will make your nerves irritable and may cause muscle spasms.  An increase in thyroid hormone.  Damage to the nerves of your voice box (larynx). This can be temporary or long-term (rare).  Hoarseness. This usually resolves in 24-48 hours.  Bleeding.  Infection. What happens before the procedure? Staying hydrated  Follow instructions from your health care provider about hydration, which may include:  Up to 2 hours before the procedure - you may continue to drink clear liquids, such as water, clear fruit juice, black coffee, and plain tea. Eating and drinking restrictions Follow instructions from your health care provider about eating and drinking, which may include:  8 hours before the procedure - stop eating heavy meals or foods such as meat, fried foods, or fatty foods.  6 hours before the procedure - stop eating  light meals or foods, such as toast or cereal.  6 hours before the procedure - stop drinking milk or drinks that contain milk.  2 hours before the procedure - stop drinking clear liquids. Medicines Ask your health care provider about:  Changing or stopping your regular medicines. This is especially important if you are taking diabetes medicines or blood thinners.  Taking medicines such as aspirin and ibuprofen. These medicines can thin your blood. Do not take these medicines unless your health care provider tells you to take them.  Taking over-the-counter medicines, vitamins, herbs, and supplements. General instructions  You may be asked to shower with a germ-killing soap.  Plan to have someone take you home from the hospital or clinic.  Plan to have a responsible adult care for you for at least 24 hours after you leave the hospital or clinic. This is important. What happens during the procedure?  To reduce your risk of infection: ? Your health care team will wash or sanitize their hands. ? Hair may be removed from the surgical area. ? Your skin will be washed with soap.  An IV will be inserted into one of your veins.  You will be given one or more of the following: ? A medicine to help you relax (sedative). ? A medicine to make you fall asleep (general anesthetic).  Your health care provider will perform your surgery using one of two methods: ? For open thyroidectomy, an incision will be made in your lower neck. Muscles in the area will be separated to reveal your thyroid gland. ? For endoscopic thyroidectomy, several small incisions will be made in your neck, chest, or armpit. An endoscopewill be inserted into an incision.  Your health care provider may monitor laryngeal nerve function during the procedure for safety reasons.  Part or all of your thyroid gland will be removed.  A tube (drain) may be placed at the incision site to drain blood and fluids that accumulate under  the skin after the procedure. The drain may have to stay in place for a day or two after the procedure.  The incision will be closed with stitches (sutures).  A dressing will be placed over your incision. The procedure may vary among health care providers and hospitals. What happens after the procedure?  Your blood pressure, heart rate, breathing rate, and blood oxygen level will be monitored often until the medicines you were given have worn off.  You will be given pain medicine as needed.  Your provider will check your ability to talk and swallow after the procedure.  You will gradually start to drink liquids and have soft foods as tolerated.  You may have a blood test to check the level of calcium in your body.  If you had a drain put in during the procedure, it will usually be removed the next day. Summary  A thyroidectomy is a surgery that is done to remove the thyroid gland.  The procedure will be done in  one of two ways: conventional, or open, thyroidectomy or endoscopic thyroidectomy.  Serious complications are rare.  Plan to have a responsible adult care for you for at least 24 hours after you leave the hospital or clinic. This is important. This information is not intended to replace advice given to you by your health care provider. Make sure you discuss any questions you have with your health care provider. Document Released: 06/09/2001 Document Revised: 10/19/2017 Document Reviewed: 10/19/2017 Elsevier Interactive Patient Education  2019 [employer].  Thyroidectomy, Care After This sheet gives you information about how to care for yourself after your procedure. Your health care provider may also give you more specific instructions. If you have problems or questions, contact your health care provider. What can I expect after the procedure? After the procedure, it is common to have:  Mild pain in the neck or upper body, especially when swallowing.  A swollen  neck.  A sore throat.  A weak or hoarse voice.  Slight tingling or numbness around your mouth, or in your fingers or toes. This may last for a day or two after surgery. This condition is caused by low levels of calcium. You may be given calcium supplements to treat it. Follow these instructions at home:  Medicines  Take over-the-counter and prescription medicines only as told by your health care provider.  Do not drive or use heavy machinery while taking prescription pain medicine.  Do not take medicines that contain aspirin and ibuprofen until your health care provider says that you can. These medicines can increase your risk of bleeding.  Take a thyroid hormone medicine as recommended by your health care provider. You will have to take this medicine for the rest of your life if your entire thyroid was removed. Eating and drinking  Start slowly with eating. You may need to have only liquids and soft foods for a few days or as directed by your health care provider.  To prevent or treat constipation while you are taking prescription pain medicine, your health care provider may recommend that you: ? Drink enough fluid to keep your urine pale yellow. ? Take over-the-counter or prescription medicines. ? Eat foods that are high in fiber, such as fresh fruits and vegetables, whole grains, and beans. ? Limit foods that are high in fat and processed sugars, such as fried and sweet foods. Incision care  Follow instructions from your health care provider about how to take care of your incision. Make sure you: ? Wash your hands with soap and water before you change your bandage (dressing). If soap and water are not available, use hand sanitizer. ? Change your dressing as told by your health care provider. ? Leave stitches (sutures), skin glue, or adhesive strips in place. These skin closures may need to stay in place for 2 weeks or longer. If adhesive strip edges start to loosen and curl up, you  may trim the loose edges. Do not remove adhesive strips completely unless your health care provider tells you to do that.  Check your incision area every day for signs of infection. Check for: ? Redness, swelling, or pain. ? Fluid or blood. ? Warmth. ? Pus or a bad smell.  Do not take baths, swim, or use a hot tub until your health care provider approves. Activity  For the first 10 days after the procedure or as instructed by your health care provider: ? Do not lift anything that is heavier than 10 lb (4.5 kg). ? Do  not jog, swim, or do other strenuous exercises. ? Do not play contact sports.  Avoid sitting for a long time without moving. Get up to take short walks every 1-2 hours. This is needed to improve blood flow and breathing. Ask for help if you feel weak or unsteady.  Return to your normal activities as told by your health care provider. Ask your health care provider what activities are safe for you. General instructions  Do not use any products that contain nicotine or tobacco, such as cigarettes and e-cigarettes. These can delay healing after surgery. If you need help quitting, ask your health care provider.  Keep all follow-up visits as told by your health care provider. This is important. Your health care provider needs to monitor the calcium level in your blood to make sure that it does not become low. Contact a health care provider if you:  Have a fever.  Have more redness, swelling, or pain around your incision area.  Have fluid or blood coming from your incision area.  Notice that your incision area feels warm to the touch.  Have pus or a bad smell coming from your incision area.  Have trouble talking.  Have nausea or vomiting for more than 2 days. Get help right away if you:  Have trouble breathing.  Have trouble swallowing.  Develop a rash.  Develop a cough that gets worse.  Notice that your speech changes, or you have hoarseness that gets  worse.  Develop numbness, tingling, or muscle spasms in the arms, hands, feet, or face. Summary  After the procedure, it is common to feel mild pain in the neck or upper body, especially when swallowing.  Take medicines as told by your health care provider. These include pain medicines and thyroid hormones, if required.  Follow instructions from your health care provider about how to take care of your incision. Watch for signs of infection.  Keep all follow-up visits as told by your health care provider. This is important. Your health care provider needs to monitor the calcium level in your blood to make sure that it does not become low.  Get help right away if you develop difficulty breathing, or numbness, tingling, or muscle spasms in the arms, hands, feet, or face. This information is not intended to replace advice given to you by your health care provider. Make sure you discuss any questions you have with your health care provider. Document Released: 07/03/2005 Document Revised: 10/19/2017 Document Reviewed: 10/19/2017 Elsevier Interactive Patient Education  2019 [place] Anesthesia, Adult General anesthesia is the use of medicines to make a person "go to sleep" (unconscious) for a medical procedure. General anesthesia must be used for certain procedures, and is often recommended for procedures that:  Last a long time.  Require you to be still or in an unusual position.  Are major and can cause blood loss. The medicines used for general anesthesia are called general anesthetics. As well as making you unconscious for a certain amount of time, these medicines:  Prevent pain.  Control your blood pressure.  Relax your muscles. Tell a health care provider about:  Any allergies you have.  All medicines you are taking, including vitamins, herbs, eye drops, creams, and over-the-counter medicines.  Any problems you or family members have had with anesthetic  medicines.  Types of anesthetics you have had in the past.  Any blood disorders you have.  Any surgeries you have had.  Any medical conditions you have.  Any recent upper respiratory, chest, or ear infections.  Any history of: ? Heart or lung conditions, such as heart failure, sleep apnea, asthma, or chronic obstructive pulmonary disease (COPD). ? [employer] forces logistics/support/administrative officer. ? Depression or anxiety.  Any tobacco or drug use, including marijuana or alcohol use.  Whether you are pregnant or may be pregnant. What are the risks? Generally, this is a safe procedure. However, problems may occur, including:  Allergic reaction.  Lung and heart problems.  Inhaling food or liquid from the stomach into the lungs (aspiration).  Nerve injury.  Dental injury.  Air in the bloodstream, which can lead to stroke.  Extreme agitation or confusion (delirium) when you wake up from the anesthetic.  Waking up during your procedure and being unable to move. This is rare. These problems are more likely to develop if you are having a major surgery or if you have an advanced or serious medical condition. You can prevent some of these complications by answering all of your health care provider's questions thoroughly and by following all instructions before your procedure. General anesthesia can cause side effects, including:  Nausea or vomiting.  A sore throat from the breathing tube.  Hoarseness.  Wheezing or coughing.  Shaking chills.  Tiredness.  Body aches.  Anxiety.  Sleepiness or drowsiness.  Confusion or agitation. What happens before the procedure? Staying hydrated Follow instructions from your health care provider about hydration, which may include:  Up to 2 hours before the procedure - you may continue to drink clear liquids, such as water, clear fruit juice, black coffee, and plain tea.  Eating and drinking restrictions Follow instructions from your health care provider about  eating and drinking, which may include:  8 hours before the procedure - stop eating heavy meals or foods such as meat, fried foods, or fatty foods.  6 hours before the procedure - stop eating light meals or foods, such as toast or cereal.  6 hours before the procedure - stop drinking milk or drinks that contain milk.  2 hours before the procedure - stop drinking clear liquids. Medicines Ask your health care provider about:  Changing or stopping your regular medicines. This is especially important if you are taking diabetes medicines or blood thinners.  Taking medicines such as aspirin and ibuprofen. These medicines can thin your blood. Do not take these medicines unless your health care provider tells you to take them.  Taking over-the-counter medicines, vitamins, herbs, and supplements. Do not take these during the week before your procedure unless your health care provider approves them. General instructions  Starting 3-6 weeks before the procedure, do not use any products that contain nicotine or tobacco, such as cigarettes and e-cigarettes. If you need help quitting, ask your health care provider.  If you brush your teeth on the morning of the procedure, make sure to spit out all of the toothpaste.  Tell your health care provider if you become ill or develop a cold, cough, or fever.  If instructed by your health care provider, bring your sleep apnea device with you on the day of your surgery (if applicable).  Ask your health care provider if you will be going home the same day, the following day, or after a longer hospital stay. ? Plan to have someone take you home from the hospital or clinic. ? Plan to have a responsible adult care for you for at least 24 hours after you leave the hospital or clinic. This is important. What happens during the  procedure?   You will be given anesthetics through both of the following: ? A mask placed over your nose and mouth. ? An IV in one of  your veins.  You may receive a medicine to help you relax (sedative).  After you are unconscious, a breathing tube may be inserted down your throat to help you breathe. This will be removed before you wake up.  An anesthesia specialist will stay with you throughout your procedure. He or she will: ? Keep you comfortable and safe by continuing to give you medicines and adjusting the amount of medicine that you get. ? Monitor your blood pressure, pulse, and oxygen levels to make sure that the anesthetics do not cause any problems. The procedure may vary among health care providers and hospitals. What happens after the procedure?  Your blood pressure, temperature, heart rate, breathing rate, and blood oxygen level will be monitored until the medicines you were given have worn off.  You will wake up in a recovery area. You may wake up slowly.  If you feel anxious or agitated, you may be given medicine to help you calm down.  If you will be going home the same day, your health care provider may check to make sure you can walk, drink, and urinate.  Your health care provider will treat any pain or side effects you have before you go home.  Do not drive for 24 hours if you were given a sedative. Summary  General anesthesia is used to keep you still and prevent pain during a procedure.  It is important to tell your health care provider about your medical history and any surgeries you have had, and previous experience with anesthesia.  Follow your health care provider's instructions about when to stop eating, drinking, or taking certain medicines before your procedure.  Plan to have someone take you home from the hospital or clinic. This information is not intended to replace advice given to you by your health care provider. Make sure you discuss any questions you have with your health care provider. Document Released: 03/22/2008 Document Revised: 05/03/2018 Document Reviewed:  07/30/2017 Elsevier Interactive Patient Education  2019 [place] Anesthesia, Adult, Care After This sheet gives you information about how to care for yourself after your procedure. Your health care provider may also give you more specific instructions. If you have problems or questions, contact your health care provider. What can I expect after the procedure? After the procedure, the following side effects are common:  Pain or discomfort at the IV site.  Nausea.  Vomiting.  Sore throat.  Trouble concentrating.  Feeling cold or chills.  Weak or tired.  Sleepiness and fatigue.  Soreness and body aches. These side effects can affect parts of the body that were not involved in surgery. Follow these instructions at home:  For at least 24 hours after the procedure:  Have a responsible adult stay with you. It is important to have someone help care for you until you are awake and alert.  Rest as needed.  Do not: ? Participate in activities in which you could fall or become injured. ? Drive. ? Use heavy machinery. ? Drink alcohol. ? Take sleeping pills or medicines that cause drowsiness. ? Make important decisions or sign legal documents. ? Take care of children on your own. Eating and drinking  Follow any instructions from your health care provider about eating or drinking restrictions.  When you feel hungry, start by eating small amounts of foods  that are soft and easy to digest (bland), such as toast. Gradually return to your regular diet.  Drink enough fluid to keep your urine pale yellow.  If you vomit, rehydrate by drinking water, juice, or clear broth. General instructions  If you have sleep apnea, surgery and certain medicines can increase your risk for breathing problems. Follow instructions from your health care provider about wearing your sleep device: ? Anytime you are sleeping, including during daytime naps. ? While taking prescription pain  medicines, sleeping medicines, or medicines that make you drowsy.  Return to your normal activities as told by your health care provider. Ask your health care provider what activities are safe for you.  Take over-the-counter and prescription medicines only as told by your health care provider.  If you smoke, do not smoke without supervision.  Keep all follow-up visits as told by your health care provider. This is important. Contact a health care provider if:  You have nausea or vomiting that does not get better with medicine.  You cannot eat or drink without vomiting.  You have pain that does not get better with medicine.  You are unable to pass urine.  You develop a skin rash.  You have a fever.  You have redness around your IV site that gets worse. Get help right away if:  You have difficulty breathing.  You have chest pain.  You have blood in your urine or stool, or you vomit blood. Summary  After the procedure, it is common to have a sore throat or nausea. It is also common to feel tired.  Have a responsible adult stay with you for the first 24 hours after general anesthesia. It is important to have someone help care for you until you are awake and alert.  When you feel hungry, start by eating small amounts of foods that are soft and easy to digest (bland), such as toast. Gradually return to your regular diet.  Drink enough fluid to keep your urine pale yellow.  Return to your normal activities as told by your health care provider. Ask your health care provider what activities are safe for you. This information is not intended to replace advice given to you by your health care provider. Make sure you discuss any questions you have with your health care provider. Document Released: 03/22/2001 Document Revised: 07/30/2017 Document Reviewed: 07/30/2017 Elsevier Interactive Patient Education  2019 [employer].

## 2018-12-30 ENCOUNTER — Other Ambulatory Visit: Payer: Self-pay

## 2018-12-30 ENCOUNTER — Encounter (HOSPITAL_COMMUNITY)
Admission: RE | Admit: 2018-12-30 | Discharge: 2018-12-30 | Disposition: A | Discharge status: Home / Self Care | Payer: BLUE CROSS/BLUE SHIELD | Source: Ambulatory Visit | Attending: General Surgery | Admitting: General Surgery

## 2018-12-30 ENCOUNTER — Encounter (HOSPITAL_COMMUNITY): Payer: Self-pay

## 2018-12-30 DIAGNOSIS — Z01812 Encounter for preprocedural laboratory examination: Secondary | ICD-10-CM | POA: Insufficient documentation

## 2018-12-30 HISTORY — DX: Polyneuropathy, unspecified: G62.9

## 2018-12-30 HISTORY — DX: Personal history of urinary calculi: Z87.442

## 2018-12-30 LAB — CBC WITH DIFFERENTIAL/PLATELET
Abs Immature Granulocytes: 0.01 10*3/uL (ref 0.00–0.07)
BASOS PCT: 1 %
Basophils Absolute: 0.1 10*3/uL (ref 0.0–0.1)
Eosinophils Absolute: 0.1 10*3/uL (ref 0.0–0.5)
Eosinophils Relative: 2 %
HCT: 38.2 % (ref 36.0–46.0)
Hemoglobin: 12 g/dL (ref 12.0–15.0)
IMMATURE GRANULOCYTES: 0 %
Lymphocytes Relative: 18 %
Lymphs Abs: 1.2 10*3/uL (ref 0.7–4.0)
MCH: 29.5 pg (ref 26.0–34.0)
MCHC: 31.4 g/dL (ref 30.0–36.0)
MCV: 93.9 fL (ref 80.0–100.0)
Monocytes Absolute: 0.7 10*3/uL (ref 0.1–1.0)
Monocytes Relative: 10 %
Neutro Abs: 4.5 10*3/uL (ref 1.7–7.7)
Neutrophils Relative %: 69 %
PLATELETS: 248 10*3/uL (ref 150–400)
RBC: 4.07 MIL/uL (ref 3.87–5.11)
RDW: 13.2 % (ref 11.5–15.5)
WBC: 6.6 10*3/uL (ref 4.0–10.5)
nRBC: 0 % (ref 0.0–0.2)

## 2018-12-30 LAB — COMPREHENSIVE METABOLIC PANEL
ALT: 16 U/L (ref 0–44)
AST: 21 U/L (ref 15–41)
Albumin: 4 g/dL (ref 3.5–5.0)
Alkaline Phosphatase: 70 U/L (ref 38–126)
Anion gap: 10 (ref 5–15)
BUN: 23 mg/dL (ref 8–23)
CHLORIDE: 103 mmol/L (ref 98–111)
CO2: 29 mmol/L (ref 22–32)
Calcium: 9.5 mg/dL (ref 8.9–10.3)
Creatinine, Ser: 0.94 mg/dL (ref 0.44–1.00)
GFR calc Af Amer: >60 mL/min (ref >60)
Glucose, Bld: 103 mg/dL — ABNORMAL HIGH (ref 70–99)
Potassium: 3.2 mmol/L — ABNORMAL LOW (ref 3.5–5.1)
Sodium: 142 mmol/L (ref 135–145)
Total Bilirubin: 0.5 mg/dL (ref 0.3–1.2)
Total Protein: 6.7 g/dL (ref 6.5–8.1)

## 2018-12-30 LAB — TSH: TSH: 1.923 u[IU]/mL (ref 0.350–4.500)

## 2018-12-31 LAB — T4: T4, Total: 7.7 ug/dL (ref 4.5–12.0)

## 2019-01-04 ENCOUNTER — Ambulatory Visit (HOSPITAL_COMMUNITY): Payer: BLUE CROSS/BLUE SHIELD | Admitting: Anesthesiology

## 2019-01-04 ENCOUNTER — Observation Stay (HOSPITAL_COMMUNITY)
Admission: RE | Admit: 2019-01-04 | Discharge: 2019-01-05 | Disposition: A | Discharge status: Home / Self Care | Payer: BLUE CROSS/BLUE SHIELD | Attending: General Surgery | Admitting: General Surgery

## 2019-01-04 ENCOUNTER — Encounter (HOSPITAL_COMMUNITY)
Admission: RE | Disposition: A | Discharge status: Home / Self Care | Payer: Self-pay | Source: Home / Self Care | Attending: General Surgery

## 2019-01-04 ENCOUNTER — Other Ambulatory Visit: Payer: Self-pay

## 2019-01-04 ENCOUNTER — Encounter (HOSPITAL_COMMUNITY): Payer: Self-pay | Admitting: Emergency Medicine

## 2019-01-04 DIAGNOSIS — E890 Postprocedural hypothyroidism: Secondary | ICD-10-CM

## 2019-01-04 DIAGNOSIS — Z923 Personal history of irradiation: Secondary | ICD-10-CM | POA: Diagnosis not present

## 2019-01-04 DIAGNOSIS — Z79899 Other long term (current) drug therapy: Secondary | ICD-10-CM | POA: Insufficient documentation

## 2019-01-04 DIAGNOSIS — Z85048 Personal history of other malignant neoplasm of rectum, rectosigmoid junction, and anus: Secondary | ICD-10-CM | POA: Diagnosis not present

## 2019-01-04 DIAGNOSIS — C73 Malignant neoplasm of thyroid gland: Secondary | ICD-10-CM | POA: Diagnosis present

## 2019-01-04 DIAGNOSIS — I10 Essential (primary) hypertension: Secondary | ICD-10-CM | POA: Diagnosis not present

## 2019-01-04 DIAGNOSIS — Z9089 Acquired absence of other organs: Secondary | ICD-10-CM

## 2019-01-04 DIAGNOSIS — Z9221 Personal history of antineoplastic chemotherapy: Secondary | ICD-10-CM | POA: Insufficient documentation

## 2019-01-04 HISTORY — PX: THYROIDECTOMY: SHX17

## 2019-01-04 LAB — COMPREHENSIVE METABOLIC PANEL
ALK PHOS: 57 U/L (ref 38–126)
ALT: 14 U/L (ref 0–44)
AST: 27 U/L (ref 15–41)
Albumin: 3.6 g/dL (ref 3.5–5.0)
Anion gap: 8 (ref 5–15)
BUN: 17 mg/dL (ref 8–23)
CO2: 25 mmol/L (ref 22–32)
Calcium: 8.5 mg/dL — ABNORMAL LOW (ref 8.9–10.3)
Chloride: 107 mmol/L (ref 98–111)
Creatinine, Ser: 1 mg/dL (ref 0.44–1.00)
GFR calc Af Amer: >60 mL/min (ref >60)
GFR calc non Af Amer: >60 mL/min (ref >60)
Glucose, Bld: 146 mg/dL — ABNORMAL HIGH (ref 70–99)
Potassium: 4 mmol/L (ref 3.5–5.1)
Sodium: 140 mmol/L (ref 135–145)
Total Bilirubin: 0.6 mg/dL (ref 0.3–1.2)
Total Protein: 6 g/dL — ABNORMAL LOW (ref 6.5–8.1)

## 2019-01-04 SURGERY — THYROIDECTOMY
Anesthesia: General | Site: Neck

## 2019-01-04 MED ORDER — BUPIVACAINE LIPOSOME 1.3 % IJ SUSP
INTRAMUSCULAR | Status: AC
  Filled 2019-01-04: qty 20

## 2019-01-04 MED ORDER — PROPOFOL 10 MG/ML IV BOLUS
INTRAVENOUS | Status: DC | PRN
  Administered 2019-01-04: 125 mg via INTRAVENOUS

## 2019-01-04 MED ORDER — LACTATED RINGERS IV SOLN
INTRAVENOUS | Status: DC
  Administered 2019-01-04: 07:00:00 via INTRAVENOUS

## 2019-01-04 MED ORDER — FENTANYL CITRATE (PF) 100 MCG/2ML IJ SOLN
INTRAMUSCULAR | Status: DC | PRN
  Administered 2019-01-04: 100 ug via INTRAVENOUS
  Administered 2019-01-04 (×2): 50 ug via INTRAVENOUS

## 2019-01-04 MED ORDER — BENAZEPRIL HCL 10 MG PO TABS
10.0000 mg | ORAL_TABLET | Freq: Every day | ORAL | Status: DC
  Administered 2019-01-05: 10 mg via ORAL
  Filled 2019-01-04: qty 1

## 2019-01-04 MED ORDER — CALCIUM CARBONATE-VITAMIN D 500-200 MG-UNIT PO TABS
2.0000 | ORAL_TABLET | Freq: Two times a day (BID) | ORAL | Status: DC
  Administered 2019-01-05: 2 via ORAL
  Filled 2019-01-04 (×3): qty 2

## 2019-01-04 MED ORDER — PROPOFOL 10 MG/ML IV BOLUS
INTRAVENOUS | Status: AC
  Filled 2019-01-04: qty 20

## 2019-01-04 MED ORDER — HYDROCODONE-ACETAMINOPHEN 5-325 MG PO TABS: 1.0000 | ORAL_TABLET | ORAL | Status: DC | PRN

## 2019-01-04 MED ORDER — MEPERIDINE HCL 50 MG/ML IJ SOLN: 6.2500 mg | INTRAMUSCULAR | Status: DC | PRN

## 2019-01-04 MED ORDER — SUGAMMADEX SODIUM 200 MG/2ML IV SOLN
INTRAVENOUS | Status: AC
  Filled 2019-01-04: qty 2

## 2019-01-04 MED ORDER — DIPHENHYDRAMINE HCL 50 MG/ML IJ SOLN: 25.0000 mg | Freq: Four times a day (QID) | INTRAMUSCULAR | Status: DC | PRN

## 2019-01-04 MED ORDER — SIMETHICONE 80 MG PO CHEW: 40.0000 mg | CHEWABLE_TABLET | Freq: Four times a day (QID) | ORAL | Status: DC | PRN

## 2019-01-04 MED ORDER — ACETAMINOPHEN 325 MG PO TABS: 650.0000 mg | ORAL_TABLET | Freq: Four times a day (QID) | ORAL | Status: DC | PRN

## 2019-01-04 MED ORDER — ROCURONIUM BROMIDE 10 MG/ML (PF) SYRINGE
PREFILLED_SYRINGE | INTRAVENOUS | Status: AC
  Filled 2019-01-04: qty 10

## 2019-01-04 MED ORDER — ONDANSETRON 4 MG PO TBDP: 4.0000 mg | ORAL_TABLET | Freq: Four times a day (QID) | ORAL | Status: DC | PRN

## 2019-01-04 MED ORDER — HYDROMORPHONE HCL 1 MG/ML IJ SOLN: 0.2500 mg | INTRAMUSCULAR | Status: DC | PRN

## 2019-01-04 MED ORDER — SUGAMMADEX SODIUM 200 MG/2ML IV SOLN
INTRAVENOUS | Status: DC | PRN
  Administered 2019-01-04: 200 mg via INTRAVENOUS

## 2019-01-04 MED ORDER — PROMETHAZINE HCL 25 MG/ML IJ SOLN: 6.2500 mg | INTRAMUSCULAR | Status: DC | PRN

## 2019-01-04 MED ORDER — 0.9 % SODIUM CHLORIDE (POUR BTL) OPTIME
TOPICAL | Status: DC | PRN
  Administered 2019-01-04: 1000 mL

## 2019-01-04 MED ORDER — LACTATED RINGERS IV SOLN: INTRAVENOUS | Status: DC

## 2019-01-04 MED ORDER — SODIUM CHLORIDE 0.9 % IV SOLN
INTRAVENOUS | Status: DC
  Administered 2019-01-04 – 2019-01-05 (×2): via INTRAVENOUS

## 2019-01-04 MED ORDER — LORAZEPAM 2 MG/ML IJ SOLN: 0.5000 mg | INTRAMUSCULAR | Status: DC | PRN

## 2019-01-04 MED ORDER — FENTANYL CITRATE (PF) 100 MCG/2ML IJ SOLN
INTRAMUSCULAR | Status: AC
  Filled 2019-01-04: qty 4

## 2019-01-04 MED ORDER — HYDROCHLOROTHIAZIDE 12.5 MG PO CAPS
12.5000 mg | ORAL_CAPSULE | Freq: Every day | ORAL | Status: DC
  Administered 2019-01-04 – 2019-01-05 (×2): 12.5 mg via ORAL
  Filled 2019-01-04 (×2): qty 1

## 2019-01-04 MED ORDER — MENTHOL 3 MG MT LOZG: 1.0000 | LOZENGE | OROMUCOSAL | Status: DC | PRN

## 2019-01-04 MED ORDER — HYDROCODONE-ACETAMINOPHEN 7.5-325 MG PO TABS: 1.0000 | ORAL_TABLET | Freq: Once | ORAL | Status: DC | PRN

## 2019-01-04 MED ORDER — LEVOTHYROXINE SODIUM 100 MCG PO TABS
100.0000 ug | ORAL_TABLET | Freq: Every day | ORAL | Status: DC
  Filled 2019-01-04: qty 1

## 2019-01-04 MED ORDER — BUPIVACAINE LIPOSOME 1.3 % IJ SUSP
INTRAMUSCULAR | Status: DC | PRN
  Administered 2019-01-04: 5 mL

## 2019-01-04 MED ORDER — MIDAZOLAM HCL 5 MG/5ML IJ SOLN
INTRAMUSCULAR | Status: DC | PRN
  Administered 2019-01-04: 2 mg via INTRAVENOUS

## 2019-01-04 MED ORDER — DIPHENHYDRAMINE HCL 25 MG PO CAPS: 25.0000 mg | ORAL_CAPSULE | Freq: Every day | ORAL | Status: DC | PRN

## 2019-01-04 MED ORDER — HYDROMORPHONE HCL 1 MG/ML IJ SOLN: 1.0000 mg | INTRAMUSCULAR | Status: DC | PRN

## 2019-01-04 MED ORDER — DIPHENHYDRAMINE HCL 25 MG PO CAPS: 25.0000 mg | ORAL_CAPSULE | Freq: Four times a day (QID) | ORAL | Status: DC | PRN

## 2019-01-04 MED ORDER — KETOROLAC TROMETHAMINE 30 MG/ML IJ SOLN
INTRAMUSCULAR | Status: AC
  Filled 2019-01-04: qty 1

## 2019-01-04 MED ORDER — DEXAMETHASONE SODIUM PHOSPHATE 10 MG/ML IJ SOLN
INTRAMUSCULAR | Status: DC | PRN
  Administered 2019-01-04: 5 mg via INTRAVENOUS

## 2019-01-04 MED ORDER — ONDANSETRON HCL 4 MG/2ML IJ SOLN
INTRAMUSCULAR | Status: DC | PRN
  Administered 2019-01-04: 4 mg via INTRAVENOUS

## 2019-01-04 MED ORDER — CHLORHEXIDINE GLUCONATE CLOTH 2 % EX PADS: 6.0000 | MEDICATED_PAD | Freq: Once | CUTANEOUS | Status: DC

## 2019-01-04 MED ORDER — DEXAMETHASONE SODIUM PHOSPHATE 10 MG/ML IJ SOLN
INTRAMUSCULAR | Status: AC
  Filled 2019-01-04: qty 1

## 2019-01-04 MED ORDER — ROCURONIUM BROMIDE 100 MG/10ML IV SOLN
INTRAVENOUS | Status: DC | PRN
  Administered 2019-01-04: 35 mg via INTRAVENOUS
  Administered 2019-01-04: 15 mg via INTRAVENOUS

## 2019-01-04 MED ORDER — KETOROLAC TROMETHAMINE 30 MG/ML IJ SOLN
30.0000 mg | Freq: Four times a day (QID) | INTRAMUSCULAR | Status: AC
  Administered 2019-01-04: 30 mg via INTRAVENOUS

## 2019-01-04 MED ORDER — ONDANSETRON HCL 4 MG/2ML IJ SOLN: 4.0000 mg | Freq: Four times a day (QID) | INTRAMUSCULAR | Status: DC | PRN

## 2019-01-04 MED ORDER — ENOXAPARIN SODIUM 40 MG/0.4ML ~~LOC~~ SOLN
40.0000 mg | Freq: Once | SUBCUTANEOUS | Status: AC
  Administered 2019-01-04: 40 mg via SUBCUTANEOUS

## 2019-01-04 MED ORDER — AMLODIPINE BESYLATE 5 MG PO TABS: 2.5000 mg | ORAL_TABLET | ORAL | Status: DC

## 2019-01-04 MED ORDER — HEMOSTATIC AGENTS (NO CHARGE) OPTIME
TOPICAL | Status: DC | PRN
  Administered 2019-01-04 (×2): 1 via TOPICAL

## 2019-01-04 MED ORDER — ONDANSETRON HCL 4 MG/2ML IJ SOLN
INTRAMUSCULAR | Status: AC
  Filled 2019-01-04: qty 2

## 2019-01-04 MED ORDER — MECLIZINE HCL 12.5 MG PO TABS: 25.0000 mg | ORAL_TABLET | Freq: Three times a day (TID) | ORAL | Status: DC | PRN

## 2019-01-04 MED ORDER — ENOXAPARIN SODIUM 40 MG/0.4ML ~~LOC~~ SOLN
SUBCUTANEOUS | Status: AC
  Filled 2019-01-04: qty 0.4

## 2019-01-04 MED ORDER — ACETAMINOPHEN 650 MG RE SUPP: 650.0000 mg | Freq: Four times a day (QID) | RECTAL | Status: DC | PRN

## 2019-01-04 MED ORDER — SEVOFLURANE IN SOLN
RESPIRATORY_TRACT | Status: AC
  Filled 2019-01-04: qty 250

## 2019-01-04 MED ORDER — MIDAZOLAM HCL 2 MG/2ML IJ SOLN
INTRAMUSCULAR | Status: AC
  Filled 2019-01-04: qty 2

## 2019-01-04 MED ORDER — KETOROLAC TROMETHAMINE 30 MG/ML IJ SOLN: 30.0000 mg | Freq: Four times a day (QID) | INTRAMUSCULAR | Status: DC | PRN

## 2019-01-04 SURGICAL SUPPLY — 61 items
APPLIER CLIP 11 MED OPEN (CLIP)
APPLIER CLIP 9.375 SM OPEN (CLIP) ×2
ATTRACTOMAT 16X20 MAGNETIC DRP (DRAPES) ×2 IMPLANT
BLADE SURG 15 STRL LF DISP TIS (BLADE) ×1 IMPLANT
BLADE SURG 15 STRL SS (BLADE) ×1
BLADE SURG SZ10 CARB STEEL (BLADE) ×2 IMPLANT
CHLORAPREP W/TINT 10.5 ML (MISCELLANEOUS) ×2 IMPLANT
CLIP APPLIE 11 MED OPEN (CLIP) IMPLANT
CLIP APPLIE 9.375 SM OPEN (CLIP) ×1 IMPLANT
CLOTH BEACON ORANGE TIMEOUT ST (SAFETY) ×2 IMPLANT
COVER LIGHT HANDLE STERIS (MISCELLANEOUS) ×4 IMPLANT
COVER WAND RF STERILE (DRAPES) ×1 IMPLANT
DERMABOND ADVANCED (GAUZE/BANDAGES/DRESSINGS) ×1
DERMABOND ADVANCED .7 DNX12 (GAUZE/BANDAGES/DRESSINGS) ×1 IMPLANT
DRAPE LAPAROTOMY 77X122 PED (DRAPES) ×2 IMPLANT
DRAPE PROXIMA HALF (DRAPES) ×4 IMPLANT
ELECT NDL TIP 2.8 STRL (NEEDLE) ×1 IMPLANT
ELECT NEEDLE TIP 2.8 STRL (NEEDLE) ×2 IMPLANT
ELECT REM PT RETURN 9FT ADLT (ELECTROSURGICAL) ×2
ELECTRODE REM PT RTRN 9FT ADLT (ELECTROSURGICAL) ×1 IMPLANT
GAUZE 4X4 16PLY RFD (DISPOSABLE) ×4 IMPLANT
GLOVE BIO SURGEON STRL SZ 6.5 (GLOVE) ×1 IMPLANT
GLOVE BIO SURGEON STRL SZ7 (GLOVE) ×1 IMPLANT
GLOVE BIOGEL PI IND STRL 6.5 (GLOVE) IMPLANT
GLOVE BIOGEL PI IND STRL 7.0 (GLOVE) ×2 IMPLANT
GLOVE BIOGEL PI INDICATOR 6.5 (GLOVE) ×1
GLOVE BIOGEL PI INDICATOR 7.0 (GLOVE) ×2
GLOVE SURG SS PI 7.5 STRL IVOR (GLOVE) ×2 IMPLANT
GOWN STRL REUS W/ TWL LRG LVL3 (GOWN DISPOSABLE) ×2 IMPLANT
GOWN STRL REUS W/TWL LRG LVL3 (GOWN DISPOSABLE) ×4 IMPLANT
HEMOSTAT SURGICEL 4X8 (HEMOSTASIS) ×2 IMPLANT
KIT BLADEGUARD II DBL (SET/KITS/TRAYS/PACK) ×2 IMPLANT
KIT TURNOVER KIT A (KITS) ×2 IMPLANT
MANIFOLD NEPTUNE II (INSTRUMENTS) ×2 IMPLANT
MARKER SKIN DUAL TIP RULER LAB (MISCELLANEOUS) ×2 IMPLANT
NDL HYPO 21X1.5 SAFETY (NEEDLE) ×1 IMPLANT
NEEDLE HYPO 21X1.5 SAFETY (NEEDLE) ×2 IMPLANT
NS IRRIG 1000ML POUR BTL (IV SOLUTION) ×2 IMPLANT
PACK BASIC III (CUSTOM PROCEDURE TRAY) ×1
PACK SRG BSC III STRL LF ECLPS (CUSTOM PROCEDURE TRAY) ×1 IMPLANT
PAD ARMBOARD 7.5X6 YLW CONV (MISCELLANEOUS) ×2 IMPLANT
PENCIL HANDSWITCHING (ELECTRODE) ×2 IMPLANT
SET BASIN LINEN APH (SET/KITS/TRAYS/PACK) ×2 IMPLANT
SHEARS HARMONIC 9CM CVD (BLADE) ×2 IMPLANT
SPONGE DRAIN TRACH 4X4 STRL 2S (GAUZE/BANDAGES/DRESSINGS) ×2 IMPLANT
SPONGE INTESTINAL PEANUT (DISPOSABLE) ×9 IMPLANT
STAPLER VISISTAT 35W (STAPLE) ×2 IMPLANT
SUT ETHILON 4 0 PS 2 18 (SUTURE) ×2 IMPLANT
SUT MNCRL AB 4-0 PS2 18 (SUTURE) ×2 IMPLANT
SUT SILK 2 0 (SUTURE) ×1
SUT SILK 2 0 SH (SUTURE) ×1 IMPLANT
SUT SILK 2-0 18XBRD TIE 12 (SUTURE) ×1 IMPLANT
SUT SILK 3 0 (SUTURE) ×1
SUT SILK 3-0 18XBRD TIE 12 (SUTURE) ×1 IMPLANT
SUT VIC AB 2-0 CT2 27 (SUTURE) ×2 IMPLANT
SUT VIC AB 3-0 SH 27 (SUTURE) ×2
SUT VIC AB 3-0 SH 27X BRD (SUTURE) ×1 IMPLANT
SYR 20CC LL (SYRINGE) ×2 IMPLANT
SYSTEM CHEST DRAIN TLS 7FR (DRAIN) ×2 IMPLANT
TOWEL OR 17X26 4PK STRL BLUE (TOWEL DISPOSABLE) ×2 IMPLANT
YANKAUER SUCT BULB TIP 10FT TU (MISCELLANEOUS) ×3 IMPLANT

## 2019-01-04 NOTE — Anesthesia Procedure Notes (Signed)
Procedure Name: Intubation Date/Time: 01/04/2019 7:41 AM Performed by: Charmaine Downs, CRNA Pre-anesthesia Checklist: Patient identified, Patient being monitored, Timeout performed, Emergency Drugs available and Suction available Patient Re-evaluated:Patient Re-evaluated prior to induction Oxygen Delivery Method: Circle System Utilized Preoxygenation: Pre-oxygenation with 100% oxygen Induction Type: IV induction Ventilation: Mask ventilation without difficulty Laryngoscope Size: Mac and 3 Grade View: Grade II Tube type: Oral Tube size: 7.0 mm Number of attempts: 1 Airway Equipment and Method: stylet Placement Confirmation: ETT inserted through vocal cords under direct vision,  positive ETCO2 and breath sounds checked- equal and bilateral Secured at: 22 cm Tube secured with: Tape Dental Injury: Teeth and Oropharynx as per pre-operative assessment

## 2019-01-04 NOTE — Progress Notes (Signed)
Report received from [facility].  Patient resting in bed at this time with siderails up x2 and callbell in reach.  Family at bedside.  No requests or concerns voiced

## 2019-01-04 NOTE — Op Note (Signed)
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  The risks and benefits of the procedure including bleeding, infection, nerve injury, voice changes, and the need for chronic hormonal replacement therapy were fully explained to the patient, who gave informed consent.  Procedure note: The patient was placed in supine position.  After induction of general endotracheal anesthesia, the head was not extended and secured.  The neck was prepped and draped using usual sterile technique with DuraPrep.  Surgical site confirmation was performed.  A transverse incision was made 1 fingerbreadth above the jugular notch.  The platysma was divided without difficulty.  The dissection was taken down to the strap muscles.  The strap muscle was then divided longitudinally which exposed the thyroid gland and trachea.  We first turned our attention to the left lobe.  Using the harmonic scalpel, the inferior thyroidal artery and vein, middle thyroidal vein, and the suspensory ligament of Berry were all divided without difficulty.  Care was taken to avoid the parathyroid glands.  The recurrent laryngeal nerve was never in the field of dissection.  The left lobe was then brought over medially and dissected off the trachea.  We then did the identical procedure on the right lobe.  Again, care was taken to avoid the parathyroid glands.  The recurrent laryngeal nerve was kept from the operative field.  No obvious lymphadenopathy was found in the area of dissection.  A single suture was placed in the left lobe for orientation purposes and the specimen was sent to  pathology for further examination.  No significant bleeding was noted at the end of the procedure.  Arista and Surgicel were placed in the thyroid beds.  The strap muscle was then reapproximated using a running 2-0 Vicryl suture.  The platysma was reapproximated using a 3-0 Vicryl running suture.  Exparel was instilled into the surrounding wound.  The skin was closed using a 4-0 Monocryl subcuticular suture.  Dermabond was applied.  All tape and needle counts were correct at the end of the procedure.  The patient was extubated in the operating room and transferred to PACU in stable condition.  She was able to phonate the letter E without difficulty.  Complications: None  EBL: Minimal  Specimen: Thyroid gland

## 2019-01-04 NOTE — Anesthesia Postprocedure Evaluation (Signed)
Anesthesia Post Note  Patient: Margaret Castaneda  Procedure(s) Performed: TOTAL THYROIDECTOMY (N/A Neck)  Patient location during evaluation: PACU Anesthesia Type: General Level of consciousness: awake and patient cooperative Pain management: pain level controlled Vital Signs Assessment: post-procedure vital signs reviewed and stable Respiratory status: spontaneous breathing, nonlabored ventilation and respiratory function stable Cardiovascular status: blood pressure returned to baseline Postop Assessment: no apparent nausea or vomiting Anesthetic complications: no     Last Vitals:  Vitals:   01/04/19 1000 01/04/19 1015  BP: 125/66 114/67  Pulse: 68 72  Resp: (!) 8 (!) 8  Temp:    SpO2: 98% 96%    Last Pain:  Vitals:   01/04/19 1015  TempSrc:   PainSc: 1                  Lopez Dentinger J

## 2019-01-04 NOTE — Anesthesia Preprocedure Evaluation (Signed)
Anesthesia Evaluation    Airway Mallampati: II       Dental  (+) Teeth Intact, Dental Advidsory Given   Pulmonary    breath sounds clear to auscultation       Cardiovascular hypertension, On Medications  Rhythm:regular     Neuro/Psych    GI/Hepatic   Endo/Other    Renal/GU      Musculoskeletal   Abdominal   Peds  Hematology   Anesthesia Other Findings Thyroid papillary CA, s/p chemo & rad tx Anal CA/rectal mass SR w/PVC  Reproductive/Obstetrics                             Anesthesia Physical Anesthesia Plan  ASA: III  Anesthesia Plan: General   Post-op Pain Management:    Induction:   PONV Risk Score and Plan:   Airway Management Planned:   Additional Equipment:   Intra-op Plan:   Post-operative Plan:   Informed Consent:   Dental Advisory Given  Plan Discussed with: Anesthesiologist  Anesthesia Plan Comments:         Anesthesia Quick Evaluation

## 2019-01-04 NOTE — Anesthesia Procedure Notes (Signed)
Procedure Name: Intubation Date/Time: 01/04/2019 7:41 AM Performed by: Jawanda Passey J, CRNA Pre-anesthesia Checklist: Patient identified, Patient being monitored, Timeout performed, Emergency Drugs available and Suction available Patient Re-evaluated:Patient Re-evaluated prior to induction Oxygen Delivery Method: Circle System Utilized Preoxygenation: Pre-oxygenation with 100% oxygen Induction Type: IV induction Ventilation: Mask ventilation without difficulty Laryngoscope Size: Mac and 3 Grade View: Grade II Tube type: Oral Tube size: 7.0 mm Number of attempts: 1 Airway Equipment and Method: stylet Placement Confirmation: ETT inserted through vocal cords under direct vision,  positive ETCO2 and breath sounds checked- equal and bilateral Secured at: 22 cm Tube secured with: Tape Dental Injury: Teeth and Oropharynx as per pre-operative assessment        

## 2019-01-04 NOTE — Transfer of Care (Signed)
Immediate Anesthesia Transfer of Care Note  Patient: Margaret Castaneda  Procedure(s) Performed: TOTAL THYROIDECTOMY (N/A Neck)  Patient Location: PACU  Anesthesia Type:General  Level of Consciousness: awake and patient cooperative  Airway & Oxygen Therapy: Patient Spontanous Breathing and Patient connected to tracheostomy mask oxygen  Post-op Assessment: Report given to RN, Post -op Vital signs reviewed and stable and Patient moving all extremities  Post vital signs: Reviewed and stable  Last Vitals:  Vitals Value Taken Time  BP    Temp    Pulse    Resp    SpO2      Last Pain:  Vitals:   01/04/19 1660  TempSrc: Oral  PainSc: 0-No pain         Complications: No apparent anesthesia complications

## 2019-01-04 NOTE — Interval H&P Note (Signed)
History and Physical Interval Note:  01/04/2019 7:15 AM  Margaret Castaneda  has presented today for surgery, with the diagnosis of thyroid cancer  The various methods of treatment have been discussed with the patient and family. After consideration of risks, benefits and other options for treatment, the patient has consented to  Procedure(s): TOTAL THYROIDECTOMY (N/A) as a surgical intervention .  The patient's history has been reviewed, patient examined, no change in status, stable for surgery.  I have reviewed the patient's chart and labs.  Questions were answered to the patient's satisfaction.     Aviva Signs

## 2019-01-05 ENCOUNTER — Encounter (HOSPITAL_COMMUNITY): Payer: Self-pay | Admitting: General Surgery

## 2019-01-05 DIAGNOSIS — C73 Malignant neoplasm of thyroid gland: Secondary | ICD-10-CM | POA: Diagnosis not present

## 2019-01-05 LAB — COMPREHENSIVE METABOLIC PANEL
ALT: 12 U/L (ref 0–44)
AST: 18 U/L (ref 15–41)
Albumin: 3.1 g/dL — ABNORMAL LOW (ref 3.5–5.0)
Alkaline Phosphatase: 56 U/L (ref 38–126)
Anion gap: 8 (ref 5–15)
BUN: 20 mg/dL (ref 8–23)
CO2: 25 mmol/L (ref 22–32)
Calcium: 8.3 mg/dL — ABNORMAL LOW (ref 8.9–10.3)
Chloride: 109 mmol/L (ref 98–111)
Creatinine, Ser: 0.94 mg/dL (ref 0.44–1.00)
GFR calc Af Amer: >60 mL/min (ref >60)
GFR calc non Af Amer: >60 mL/min (ref >60)
Glucose, Bld: 109 mg/dL — ABNORMAL HIGH (ref 70–99)
Potassium: 3.9 mmol/L (ref 3.5–5.1)
Sodium: 142 mmol/L (ref 135–145)
Total Bilirubin: 0.6 mg/dL (ref 0.3–1.2)
Total Protein: 5.4 g/dL — ABNORMAL LOW (ref 6.5–8.1)

## 2019-01-05 LAB — CBC
HCT: 32 % — ABNORMAL LOW (ref 36.0–46.0)
Hemoglobin: 10.2 g/dL — ABNORMAL LOW (ref 12.0–15.0)
MCH: 29.9 pg (ref 26.0–34.0)
MCHC: 31.9 g/dL (ref 30.0–36.0)
MCV: 93.8 fL (ref 80.0–100.0)
NRBC: 0 % (ref 0.0–0.2)
Platelets: 211 10*3/uL (ref 150–400)
RBC: 3.41 MIL/uL — AB (ref 3.87–5.11)
RDW: 13.4 % (ref 11.5–15.5)
WBC: 10.2 10*3/uL (ref 4.0–10.5)

## 2019-01-05 MED ORDER — LEVOTHYROXINE SODIUM 100 MCG PO TABS: 100.0000 ug | ORAL_TABLET | Freq: Every day | ORAL | 0 refills | Status: DC

## 2019-01-05 MED ORDER — CALCIUM CARBONATE-VITAMIN D 500-200 MG-UNIT PO TABS: 2.0000 | ORAL_TABLET | Freq: Two times a day (BID) | ORAL | 1 refills | Status: DC

## 2019-01-05 NOTE — Discharge Summary (Signed)
Physician Discharge Summary  Patient ID: Margaret Castaneda MRN: [id number] DOB/AGE: [birth year] 63 y.o.  Admit date: 01/04/2019 Discharge date: 01/05/2019  Admission Diagnoses: Papillary carcinoma of thyroid gland  Discharge Diagnoses: Same Active Problems:   Papillary carcinoma of thyroid ([place])   S/P total thyroidectomy   Discharged Condition: good  Hospital Course: Patient is a 63 year old white female who was found to have an incidental papillary thyroid carcinoma of the thyroid gland.  The patient underwent a total thyroidectomy on 01/04/2019.  She tolerated the surgery well.  Her postoperative course has been unremarkable.  Her diet was advanced difficulty.  She had no problems phonating the E.  Her calcium levels have remained within normal limits.  Final pathology is pending.  She is being discharged home on 01/05/2019 in good and improving condition.  Treatments: surgery: Total thyroidectomy on 01/04/2019  Discharge Exam: Blood pressure (!) 94/47, pulse 80, temperature 98.4 F (36.9 C), temperature source Oral, resp. rate 18, SpO2 97 %. General appearance: alert, cooperative and no distress Neck: Incision healing well.  No swelling. Resp: clear to auscultation bilaterally Cardio: regular rate and rhythm, S1, S2 normal, no murmur, click, rub or gallop  Disposition: Discharge disposition: 01-Home or Self Care       Discharge Instructions    Diet - low sodium heart healthy   Complete by:  As directed    Increase activity slowly   Complete by:  As directed      Allergies as of 01/05/2019   No Known Allergies     Medication List    TAKE these medications   acetaminophen 500 MG tablet Commonly known as:  TYLENOL Take 1 tablet (500 mg total) by mouth every 6 (six) hours as needed for moderate pain or headache.   amLODipine 5 MG tablet Commonly known as:  NORVASC Take 2.5 mg by mouth every Monday, Wednesday, and Friday.   benazepril 10 MG tablet Commonly known as:   LOTENSIN Take 10 mg by mouth daily.   calcium-vitamin D 500-200 MG-UNIT tablet Commonly known as:  OSCAL WITH D Take 2 tablets by mouth 2 (two) times daily.   diphenhydrAMINE 25 mg capsule Commonly known as:  BENADRYL Take 25 mg by mouth daily as needed for allergies.   hydrochlorothiazide 12.5 MG capsule Commonly known as:  MICROZIDE Take 12.5 mg by mouth daily.   ibuprofen 200 MG tablet Commonly known as:  ADVIL,MOTRIN Take 400 mg by mouth every 6 (six) hours as needed for headache or moderate pain.   levothyroxine 100 MCG tablet Commonly known as:  SYNTHROID, LEVOTHROID Take 1 tablet (100 mcg total) by mouth daily before breakfast. Start taking on:  January 06, 2019   meclizine 25 MG tablet Commonly known as:  ANTIVERT Take 25 mg by mouth 3 (three) times daily as needed for dizziness.   phenylephrine-shark liver oil-mineral oil-petrolatum 0.25-3-14-71.9 % rectal ointment Commonly known as:  PREPARATION H Place 1 application rectally 2 (two) times daily as needed for hemorrhoids.   ranitidine 150 MG tablet Commonly known as:  ZANTAC Take 150 mg by mouth daily.      Follow-up Information    Aviva Signs, MD. Schedule an appointment as soon as possible for a visit on 01/12/2019.   Specialty:  General Surgery Contact information: [id number] [id number] [phone]           Signed: Aviva Signs 01/05/2019, 7:54 AM

## 2019-01-05 NOTE — Discharge Instructions (Signed)
Thyroidectomy, Care After This sheet gives you information about how to care for yourself after your procedure. Your health care provider may also give you more specific instructions. If you have problems or questions, contact your health care provider. What can I expect after the procedure? After the procedure, it is common to have:  Mild pain in the neck or upper body, especially when swallowing.  A swollen neck.  A sore throat.  A weak or hoarse voice.  Slight tingling or numbness around your mouth, or in your fingers or toes. This may last for a day or two after surgery. This condition is caused by low levels of calcium. You may be given calcium supplements to treat it. Follow these instructions at home:  Medicines  Take over-the-counter and prescription medicines only as told by your health care provider.  Do not drive or use heavy machinery while taking prescription pain medicine.  Do not take medicines that contain aspirin and ibuprofen until your health care provider says that you can. These medicines can increase your risk of bleeding.  Take a thyroid hormone medicine as recommended by your health care provider. You will have to take this medicine for the rest of your life if your entire thyroid was removed. Eating and drinking  Start slowly with eating. You may need to have only liquids and soft foods for a few days or as directed by your health care provider.  To prevent or treat constipation while you are taking prescription pain medicine, your health care provider may recommend that you: ? Drink enough fluid to keep your urine pale yellow. ? Take over-the-counter or prescription medicines. ? Eat foods that are high in fiber, such as fresh fruits and vegetables, whole grains, and beans. ? Limit foods that are high in fat and processed sugars, such as fried and sweet foods. Incision care  Follow instructions from your health care provider about how to take care of your  incision. Make sure you: ? Wash your hands with soap and water before you change your bandage (dressing). If soap and water are not available, use hand sanitizer. ? Change your dressing as told by your health care provider. ? Leave stitches (sutures), skin glue, or adhesive strips in place. These skin closures may need to stay in place for 2 weeks or longer. If adhesive strip edges start to loosen and curl up, you may trim the loose edges. Do not remove adhesive strips completely unless your health care provider tells you to do that.  Check your incision area every day for signs of infection. Check for: ? Redness, swelling, or pain. ? Fluid or blood. ? Warmth. ? Pus or a bad smell.  Do not take baths, swim, or use a hot tub until your health care provider approves. Activity  For the first 10 days after the procedure or as instructed by your health care provider: ? Do not lift anything that is heavier than 10 lb (4.5 kg). ? Do not jog, swim, or do other strenuous exercises. ? Do not play contact sports.  Avoid sitting for a long time without moving. Get up to take short walks every 1-2 hours. This is needed to improve blood flow and breathing. Ask for help if you feel weak or unsteady.  Return to your normal activities as told by your health care provider. Ask your health care provider what activities are safe for you. General instructions  Do not use any products that contain nicotine or tobacco, such as  cigarettes and e-cigarettes. These can delay healing after surgery. If you need help quitting, ask your health care provider.  Keep all follow-up visits as told by your health care provider. This is important. Your health care provider needs to monitor the calcium level in your blood to make sure that it does not become low. Contact a health care provider if you:  Have a fever.  Have more redness, swelling, or pain around your incision area.  Have fluid or blood coming from your  incision area.  Notice that your incision area feels warm to the touch.  Have pus or a bad smell coming from your incision area.  Have trouble talking.  Have nausea or vomiting for more than 2 days. Get help right away if you:  Have trouble breathing.  Have trouble swallowing.  Develop a rash.  Develop a cough that gets worse.  Notice that your speech changes, or you have hoarseness that gets worse.  Develop numbness, tingling, or muscle spasms in the arms, hands, feet, or face. Summary  After the procedure, it is common to feel mild pain in the neck or upper body, especially when swallowing.  Take medicines as told by your health care provider. These include pain medicines and thyroid hormones, if required.  Follow instructions from your health care provider about how to take care of your incision. Watch for signs of infection.  Keep all follow-up visits as told by your health care provider. This is important. Your health care provider needs to monitor the calcium level in your blood to make sure that it does not become low.  Get help right away if you develop difficulty breathing, or numbness, tingling, or muscle spasms in the arms, hands, feet, or face. This information is not intended to replace advice given to you by your health care provider. Make sure you discuss any questions you have with your health care provider. Document Released: 07/03/2005 Document Revised: 10/19/2017 Document Reviewed: 10/19/2017 Elsevier Interactive Patient Education  2019 [employer].

## 2019-01-05 NOTE — Progress Notes (Signed)
IV discontinued,catheter intact. Patient discharged home with instructions given on medications and follow up visits,patient verbalized understanding. Prescriptions sent to Pharmacy of choice documented on AVS.

## 2019-01-10 ENCOUNTER — Encounter: Payer: Self-pay | Admitting: Orthopedic Surgery

## 2019-01-12 ENCOUNTER — Encounter: Payer: Self-pay | Admitting: General Surgery

## 2019-01-12 ENCOUNTER — Ambulatory Visit (INDEPENDENT_AMBULATORY_CARE_PROVIDER_SITE_OTHER): Payer: Self-pay | Admitting: General Surgery

## 2019-01-12 VITALS — BP 132/72 | HR 75 | Temp 97.8°F | Resp 16 | Wt 126.6 lb

## 2019-01-12 DIAGNOSIS — Z09 Encounter for follow-up examination after completed treatment for conditions other than malignant neoplasm: Secondary | ICD-10-CM

## 2019-01-12 NOTE — Progress Notes (Signed)
Subjective:     [name]  Here for follow-up visit.  Patient doing very well.  She denies any voice changes, periorbital tingling.  Her energy level is back to normal.  She is taking her Synthroid and calcium.  She states she had labs done at an outside facility through her primary care doctor's office which showed a calcium level within normal range.  She has already printed off her pathology report through my chart. Objective:    BP 132/72 (BP Location: Left Arm, Patient Position: Sitting, Cuff Size: Normal)   Pulse 75   Temp 97.8 F (36.6 C) (Temporal)   Resp 16   Wt 126 lb 9.6 oz (57.4 kg)   BMI 23.92 kg/m   General:  alert, cooperative and no distress  Neck incision healing well.  No ecchymosis or swelling present. Final pathology reviewed with patient.     Assessment:    Doing well postoperatively.    Plan:   Patient was told that she could decrease her calcium supplementation to 1 tablet twice a day.  She is following up with Dr. Delton Coombes who I understand is referring her to Dr. Dorris Fetch of endocrinology for further management treatment.  Follow-up here as needed.

## 2019-01-13 ENCOUNTER — Telehealth (HOSPITAL_COMMUNITY): Payer: Self-pay | Admitting: *Deleted

## 2019-01-13 NOTE — Telephone Encounter (Signed)
Pt called stating that her PET scan has been denied and is now in review, PET scan has been rescheduled as well as follow up appointment with [place]. Pt states that she has had her thyroidectomy and that she wanted to make sure she didn't need the endocrinologist referral sooner than her follow up appointment.   I spoke with Dr. Worthy Keeler and he advised that he would send the referral once he has seen the pt at follow up.   I called the pt back and informed her of above, pt verbalized understanding.

## 2019-01-16 ENCOUNTER — Encounter (HOSPITAL_COMMUNITY): Payer: BLUE CROSS/BLUE SHIELD

## 2019-01-16 ENCOUNTER — Other Ambulatory Visit (HOSPITAL_COMMUNITY): Payer: BLUE CROSS/BLUE SHIELD

## 2019-01-17 ENCOUNTER — Other Ambulatory Visit (HOSPITAL_COMMUNITY): Payer: BLUE CROSS/BLUE SHIELD

## 2019-01-19 ENCOUNTER — Ambulatory Visit (HOSPITAL_COMMUNITY): Payer: BLUE CROSS/BLUE SHIELD | Admitting: Hematology

## 2019-01-24 ENCOUNTER — Ambulatory Visit (HOSPITAL_COMMUNITY): Payer: BLUE CROSS/BLUE SHIELD | Admitting: Hematology

## 2019-01-28 HISTORY — PX: SIGMOIDOSCOPY: SUR1295

## 2019-01-30 ENCOUNTER — Other Ambulatory Visit (HOSPITAL_COMMUNITY): Payer: BLUE CROSS/BLUE SHIELD

## 2019-02-01 ENCOUNTER — Inpatient Hospital Stay (HOSPITAL_COMMUNITY): Payer: BLUE CROSS/BLUE SHIELD | Attending: Hematology

## 2019-02-01 ENCOUNTER — Encounter (HOSPITAL_COMMUNITY): Payer: Self-pay | Admitting: Hematology

## 2019-02-01 ENCOUNTER — Ambulatory Visit: Payer: BLUE CROSS/BLUE SHIELD | Admitting: "Endocrinology

## 2019-02-01 ENCOUNTER — Inpatient Hospital Stay (HOSPITAL_COMMUNITY): Payer: BLUE CROSS/BLUE SHIELD | Admitting: Hematology

## 2019-02-01 VITALS — BP 153/75 | HR 79 | Temp 98.5°F | Resp 16 | Wt 124.1 lb

## 2019-02-01 DIAGNOSIS — C73 Malignant neoplasm of thyroid gland: Secondary | ICD-10-CM | POA: Insufficient documentation

## 2019-02-01 DIAGNOSIS — H9319 Tinnitus, unspecified ear: Secondary | ICD-10-CM

## 2019-02-01 DIAGNOSIS — C211 Malignant neoplasm of anal canal: Secondary | ICD-10-CM | POA: Diagnosis present

## 2019-02-01 DIAGNOSIS — K6289 Other specified diseases of anus and rectum: Secondary | ICD-10-CM | POA: Insufficient documentation

## 2019-02-01 DIAGNOSIS — R531 Weakness: Secondary | ICD-10-CM | POA: Insufficient documentation

## 2019-02-01 LAB — CBC WITH DIFFERENTIAL/PLATELET
Abs Immature Granulocytes: 0.01 10*3/uL (ref 0.00–0.07)
Basophils Absolute: 0 10*3/uL (ref 0.0–0.1)
Basophils Relative: 1 %
EOS PCT: 2 %
Eosinophils Absolute: 0.1 10*3/uL (ref 0.0–0.5)
HCT: 36.7 % (ref 36.0–46.0)
Hemoglobin: 11.7 g/dL — ABNORMAL LOW (ref 12.0–15.0)
Immature Granulocytes: 0 %
Lymphocytes Relative: 24 %
Lymphs Abs: 1.1 10*3/uL (ref 0.7–4.0)
MCH: 29.5 pg (ref 26.0–34.0)
MCHC: 31.9 g/dL (ref 30.0–36.0)
MCV: 92.4 fL (ref 80.0–100.0)
Monocytes Absolute: 0.6 10*3/uL (ref 0.1–1.0)
Monocytes Relative: 13 %
Neutro Abs: 2.9 10*3/uL (ref 1.7–7.7)
Neutrophils Relative %: 60 %
Platelets: 252 10*3/uL (ref 150–400)
RBC: 3.97 MIL/uL (ref 3.87–5.11)
RDW: 13.3 % (ref 11.5–15.5)
WBC: 4.7 10*3/uL (ref 4.0–10.5)
nRBC: 0 % (ref 0.0–0.2)

## 2019-02-01 LAB — COMPREHENSIVE METABOLIC PANEL
ALT: 16 U/L (ref 0–44)
ANION GAP: 7 (ref 5–15)
AST: 22 U/L (ref 15–41)
Albumin: 3.9 g/dL (ref 3.5–5.0)
Alkaline Phosphatase: 70 U/L (ref 38–126)
BUN: 17 mg/dL (ref 8–23)
CO2: 30 mmol/L (ref 22–32)
Calcium: 9.4 mg/dL (ref 8.9–10.3)
Chloride: 102 mmol/L (ref 98–111)
Creatinine, Ser: 0.93 mg/dL (ref 0.44–1.00)
GFR calc Af Amer: >60 mL/min (ref >60)
GFR calc non Af Amer: >60 mL/min (ref >60)
Glucose, Bld: 104 mg/dL — ABNORMAL HIGH (ref 70–99)
Potassium: 3.9 mmol/L (ref 3.5–5.1)
Sodium: 139 mmol/L (ref 135–145)
Total Bilirubin: 0.7 mg/dL (ref 0.3–1.2)
Total Protein: 6.5 g/dL (ref 6.5–8.1)

## 2019-02-01 LAB — TSH: TSH: 0.062 u[IU]/mL — ABNORMAL LOW (ref 0.350–4.500)

## 2019-02-01 NOTE — Patient Instructions (Signed)
[facility] Discharge Instructions     Thank you for choosing [facility] to provide your oncology and hematology care.  To afford each patient quality time with our provider, please arrive at least 15 minutes before your scheduled appointment time.   If you have a lab appointment with the Cancer Center please come in thru the  Main Entrance and check in at the main information desk  You need to re-schedule your appointment should you arrive 10 or more minutes late.  We strive to give you quality time with our providers, and arriving late affects you and other patients whose appointments are after yours.  Also, if you no show three or more times for appointments you may be dismissed from the clinic at the providers discretion.     Again, thank you for choosing [facility].  Our hope is that these requests will decrease the amount of time that you wait before being seen by our physicians.       _____________________________________________________________  Should you have questions after your visit to [facility], please contact our office at [phone] between the hours of 8:00 a.m. and 4:30 p.m.  Voicemails left after 4:00 p.m. will not be returned until the following business day.  For prescription refill requests, have your pharmacy contact our office and allow 72 hours.    Cancer Center Support Programs:   > Cancer Support Group  2nd Tuesday of the month 1pm-2pm, Journey Room    

## 2019-02-01 NOTE — Progress Notes (Signed)
[place], [place] [address]   CLINIC:  Medical Oncology/Hematology  PCP:  Kennieth Rad, MD [facility] [address] [place] [address] [phone]   REASON FOR VISIT: Follow-up for anal cancer AND Thyroid   CURRENT THERAPY: Surveillance   BRIEF ONCOLOGIC HISTORY:    Primary cancer of anal canal ([place])   06/02/2018 Initial Diagnosis    Primary cancer of anal canal ([place])    06/09/2018 -  Chemotherapy    The patient had ondansetron (ZOFRAN) 8 mg in sodium chloride 0.9 % 50 mL IVPB, , Intravenous,  Once, 1 of 1 cycle Administration:  (07/05/2018),  (08/01/2018) mitoMYcin (MUTAMYCIN) chemo injection 15.5 mg, 10 mg/m2 = 15.5 mg, Intravenous,  Once, 1 of 1 cycle Administration: 15.5 mg (07/05/2018), 15.5 mg (08/01/2018)  for chemotherapy treatment.      INTERVAL HISTORY:  Margaret Castaneda 63 y.o. female returns for routine follow-up for anal cancer. She is here today with her husband. She is doing well since surgery. She did report severe constipation for two days after her surgery. It has improved since then. She still has rectal pain from that. She has constant ringing in her ears. She also has lower extremity weakness and soreness. Denies any nausea, vomiting, or diarrhea. Denies any new pains. Had not noticed any recent bleeding such as epistaxis, hematuria or hematochezia. Denies recent chest pain on exertion, shortness of breath on minimal exertion, pre-syncopal episodes, or palpitations. Denies any numbness or tingling in hands or feet. Denies any recent fevers, infections, or recent hospitalizations. Patient reports appetite at 100% and energy level at 100%. She is maintaining her weight well.    REVIEW OF SYSTEMS:  Review of Systems  HENT:   Positive for tinnitus.   Gastrointestinal: Positive for constipation and rectal pain.  All other systems reviewed and are negative.    PAST MEDICAL/SURGICAL HISTORY:  Past Medical  History:  Diagnosis Date  . Cancer ([place])    Anal Cancer  . History of kidney stones   . Hypertension   . Neuropathy   . Rectal mass 05/18/2018   Past Surgical History:  Procedure Laterality Date  . COLONOSCOPY N/A 05/24/2018   Procedure: COLONOSCOPY;  Surgeon: Rogene Houston, MD;  Location: [facility];  Service: Endoscopy;  Laterality: N/A;  7:30  . reversal tubal ligation    . THYROIDECTOMY N/A 01/04/2019   Procedure: TOTAL THYROIDECTOMY;  Surgeon: Aviva Signs, MD;  Location: [facility] ORS;  Service: General;  Laterality: N/A;  . TONSILLECTOMY    . TUBAL LIGATION       SOCIAL HISTORY:  Social History   Socioeconomic History  . Marital status: Married    Spouse name: Not on file  . Number of children: Not on file  . Years of education: Not on file  . Highest education level: Not on file  Occupational History  . Not on file  Social Needs  . Financial resource strain: Not on file  . Food insecurity:    Worry: Not on file    Inability: Not on file  . Transportation needs:    Medical: Not on file    Non-medical: Not on file  Tobacco Use  . Smoking status: Never Smoker  . Smokeless tobacco: Never Used  Substance and Sexual Activity  . Alcohol use: Never    Frequency: Never  . Drug use: Never  . Sexual activity: Yes    Birth control/protection: Surgical  Lifestyle  . Physical  activity:    Days per week: Not on file    Minutes per session: Not on file  . Stress: Not on file  Relationships  . Social connections:    Talks on phone: Not on file    Gets together: Not on file    Attends religious service: Not on file    Active member of club or organization: Not on file    Attends meetings of clubs or organizations: Not on file    Relationship status: Not on file  . Intimate partner violence:    Fear of current or ex partner: Not on file    Emotionally abused: Not on file    Physically abused: Not on file    Forced sexual activity: Not on file  Other Topics Concern    . Not on file  Social History Narrative  . Not on file    FAMILY HISTORY:  Family History  Problem Relation Age of Onset  . Gastric cancer Paternal Grandfather   . Hypertension Mother   . Kidney cancer Father   . Stroke Father   . Heart attack Father   . Thyroid cancer Sister   . Hypertension Brother   . Breast cancer Maternal Grandmother   . Colon cancer Neg Hx     CURRENT MEDICATIONS:  Outpatient Encounter Medications as of 02/01/2019  Medication Sig  . acetaminophen (TYLENOL) 500 MG tablet Take 1 tablet (500 mg total) by mouth every 6 (six) hours as needed for moderate pain or headache.  Marland Kitchen amLODipine (NORVASC) 5 MG tablet Take 2.5 mg by mouth every Monday, Wednesday, and Friday.   . benazepril (LOTENSIN) 10 MG tablet Take 10 mg by mouth daily.  . calcium-vitamin D (OSCAL WITH D) 500-200 MG-UNIT tablet Take 2 tablets by mouth 2 (two) times daily.  . diphenhydrAMINE (BENADRYL) 25 mg capsule Take 25 mg by mouth daily as needed for allergies.  . hydrochlorothiazide (MICROZIDE) 12.5 MG capsule Take 12.5 mg by mouth daily.  Marland Kitchen ibuprofen (ADVIL,MOTRIN) 200 MG tablet Take 400 mg by mouth every 6 (six) hours as needed for headache or moderate pain.   Marland Kitchen levothyroxine (SYNTHROID, LEVOTHROID) 100 MCG tablet Take 1 tablet (100 mcg total) by mouth daily before breakfast.  . meclizine (ANTIVERT) 25 MG tablet Take 25 mg by mouth 3 (three) times daily as needed for dizziness.  . phenylephrine-shark liver oil-mineral oil-petrolatum (PREPARATION H) 0.25-3-14-71.9 % rectal ointment Place 1 application rectally 2 (two) times daily as needed for hemorrhoids.  . ranitidine (ZANTAC) 150 MG tablet Take 150 mg by mouth daily.   No facility-administered encounter medications on file as of 02/01/2019.     ALLERGIES:  No Known Allergies   PHYSICAL EXAM:  ECOG Performance status: 1  Vitals:   02/01/19 1029  BP: (!) 153/75  Pulse: 79  Resp: 16  Temp: 98.5 F (36.9 C)  SpO2: 100%   Filed Weights    02/01/19 1029  Weight: 124 lb 1.6 oz (56.3 kg)    Physical Exam Constitutional:      Appearance: Normal appearance. She is normal weight.  Cardiovascular:     Rate and Rhythm: Normal rate and regular rhythm.     Heart sounds: Normal heart sounds.  Pulmonary:     Effort: Pulmonary effort is normal.     Breath sounds: Normal breath sounds.  Abdominal:     General: Abdomen is flat.     Palpations: Abdomen is soft.  Musculoskeletal: Normal range of motion.  Skin:  General: Skin is warm and dry.  Neurological:     Mental Status: She is alert and oriented to person, place, and time. Mental status is at baseline.  Psychiatric:        Mood and Affect: Mood normal.        Behavior: Behavior normal.        Thought Content: Thought content normal.        Judgment: Judgment normal.      LABORATORY DATA:  I have reviewed the labs as listed.  CBC    Component Value Date/Time   WBC 4.7 02/01/2019 0927   RBC 3.97 02/01/2019 0927   HGB 11.7 (L) 02/01/2019 0927   HCT 36.7 02/01/2019 0927   PLT 252 02/01/2019 0927   MCV 92.4 02/01/2019 0927   MCH 29.5 02/01/2019 0927   MCHC 31.9 02/01/2019 0927   RDW 13.3 02/01/2019 0927   LYMPHSABS 1.1 02/01/2019 0927   MONOABS 0.6 02/01/2019 0927   EOSABS 0.1 02/01/2019 0927   BASOSABS 0.0 02/01/2019 0927   CMP Latest Ref Rng & Units 02/01/2019 01/05/2019 01/04/2019  Glucose 70 - 99 mg/dL 104(H) 109(H) 146(H)  BUN 8 - 23 mg/dL 17 20 17   Creatinine 0.44 - 1.00 mg/dL 0.93 0.94 1.00  Sodium 135 - 145 mmol/L 139 142 140  Potassium 3.5 - 5.1 mmol/L 3.9 3.9 4.0  Chloride 98 - 111 mmol/L 102 109 107  CO2 22 - 32 mmol/L 30 25 25   Calcium 8.9 - 10.3 mg/dL 9.4 8.3(L) 8.5(L)  Total Protein 6.5 - 8.1 g/dL 6.5 5.4(L) 6.0(L)  Total Bilirubin 0.3 - 1.2 mg/dL 0.7 0.6 0.6  Alkaline Phos 38 - 126 U/L 70 56 57  AST 15 - 41 U/L 22 18 27   ALT 0 - 44 U/L 16 12 14        DIAGNOSTIC IMAGING:  I have independently reviewed the scans and discussed with the  patient.   I have reviewed Francene Finders, NP's note and agree with the documentation.  I personally performed a face-to-face visit, made revisions and my assessment and plan is as follows.    ASSESSMENT & PLAN:   Primary cancer of anal canal ([place]) 1.  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 -Colonoscopy on 05/24/2018 shows polypoid nonobstructing, non-circumferential, approximately 2 x 2 centimeter small mass found in the distal rectum, biopsy consistent with invasive squamous cell carcinoma, P 16 strongly positive.  - PET CT scan on 06/06/2018 shows 3 cm segment uptake in the anal canal region with no pelvic adenopathy.  There is uptake in the left lobe of the thyroid.  - Radiation therapy with Xeloda and mitomycin from 07/05/2018 through 08/17/2018.  - CT CAP on 10/12/2018 did not show any evidence of metastatic disease.  Previously noted circumferential wall thickening and hyperenhancement in the anus has decreased.  No discrete residual anal mass.  - Digital rectal exam on 11/28/2018 showed some scarring in the anterior anal wall at the site of tumor.  No bleeding. - She will have surveillance with DRE every 3 to 6 months for 5 years along with inguinal node palpation.  Sigmoidoscopy every 6 to 12 months for 3 years is also recommended.  We will make a referral to Dr. Laural Golden. Do CT CAP with contrast is recommended annually for 3 years.  We will plan to repeat it in October.  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  She is having constant ringing in the ear.  Her hearing loss has mildly improved.  2.  Right breast lump: - CT scan showed incidental right breast lump.  Patient reports that she is aware of this for the last 25 years.  A needle biopsy at that time was benign.  Serial mammograms did not show any growth.  3.  Papillary thyroid cancer: -She had a total thyroidectomy on 01/04/2019 which  showed at least 2 foci, both in the left lobe, measuring 0.6 cm 0.1 cm.  There is 0.6 cm lesion is the classic form of papillary thyroid carcinoma.  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  No lymph nodes were examined.  Pathological classification pT1a, P NX.  No lymphovascular invasion.  Margins negative. - Would recommend a TSH, thyroglobulin and antithyroglobulin antibodies at baseline.  Patient has an appointment to see Dr. Dorris Fetch next week.        Orders placed this encounter:  Orders Placed This Encounter  Procedures  . TSH  . TSH  . CBC with Differential/Platelet  . Comprehensive metabolic panel      Derek Jack, MD [facility] [phone]

## 2019-02-01 NOTE — Assessment & Plan Note (Signed)
1.  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 -Colonoscopy on 05/24/2018 shows polypoid nonobstructing, non-circumferential, approximately 2 x 2 centimeter small mass found in the distal rectum, biopsy consistent with invasive squamous cell carcinoma, P 16 strongly positive.  - PET CT scan on 06/06/2018 shows 3 cm segment uptake in the anal canal region with no pelvic adenopathy.  There is uptake in the left lobe of the thyroid.  - Radiation therapy with Xeloda and mitomycin from 07/05/2018 through 08/17/2018.  - CT CAP on 10/12/2018 did not show any evidence of metastatic disease.  Previously noted circumferential wall thickening and hyperenhancement in the anus has decreased.  No discrete residual anal mass.  - Digital rectal exam on 11/28/2018 showed some scarring in the anterior anal wall at the site of tumor.  No bleeding. - She will have surveillance with DRE every 3 to 6 months for 5 years along with inguinal node palpation.  Sigmoidoscopy every 6 to 12 months for 3 years is also recommended.  We will make a referral to Dr. Laural Golden. Do CT CAP with contrast is recommended annually for 3 years.  We will plan to repeat it in October.  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  She is having constant ringing in the ear.  Her hearing loss has mildly improved.  2.  Right breast lump: - CT scan showed incidental right breast lump.  Patient reports that she is aware of this for the last 25 years.  A needle biopsy at that time was benign.  Serial mammograms did not show any growth.  3.  Papillary thyroid cancer: -She had a total thyroidectomy on 01/04/2019 which showed at least 2 foci, both in the left lobe, measuring 0.6 cm 0.1 cm.  There is 0.6 cm lesion is the classic form of papillary thyroid carcinoma.  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  No lymph nodes were  examined.  Pathological classification pT1a, P NX.  No lymphovascular invasion.  Margins negative. - Would recommend a TSH, thyroglobulin and antithyroglobulin antibodies at baseline.  Patient has an appointment to see Dr. Dorris Fetch next week.

## 2019-02-14 ENCOUNTER — Encounter: Payer: Self-pay | Admitting: "Endocrinology

## 2019-02-14 ENCOUNTER — Ambulatory Visit: Payer: BLUE CROSS/BLUE SHIELD | Admitting: "Endocrinology

## 2019-02-14 ENCOUNTER — Encounter (INDEPENDENT_AMBULATORY_CARE_PROVIDER_SITE_OTHER): Payer: Self-pay | Admitting: *Deleted

## 2019-02-14 ENCOUNTER — Ambulatory Visit (INDEPENDENT_AMBULATORY_CARE_PROVIDER_SITE_OTHER): Payer: BLUE CROSS/BLUE SHIELD | Admitting: Internal Medicine

## 2019-02-14 ENCOUNTER — Encounter (INDEPENDENT_AMBULATORY_CARE_PROVIDER_SITE_OTHER): Payer: Self-pay | Admitting: Internal Medicine

## 2019-02-14 VITALS — BP 129/87 | HR 88 | Temp 98.1°F | Resp 18 | Ht 61.0 in | Wt 125.9 lb

## 2019-02-14 VITALS — BP 146/85 | HR 84 | Ht 61.0 in | Wt 125.0 lb

## 2019-02-14 DIAGNOSIS — K649 Unspecified hemorrhoids: Secondary | ICD-10-CM | POA: Diagnosis not present

## 2019-02-14 DIAGNOSIS — C73 Malignant neoplasm of thyroid gland: Secondary | ICD-10-CM | POA: Insufficient documentation

## 2019-02-14 DIAGNOSIS — C211 Malignant neoplasm of anal canal: Secondary | ICD-10-CM | POA: Diagnosis not present

## 2019-02-14 DIAGNOSIS — E890 Postprocedural hypothyroidism: Secondary | ICD-10-CM | POA: Diagnosis not present

## 2019-02-14 MED ORDER — HYDROCORTISONE ACETATE 25 MG RE SUPP: 25.0000 mg | Freq: Every day | RECTAL | 1 refills | Status: DC

## 2019-02-14 MED ORDER — FAMOTIDINE 20 MG PO TABS: 20.0000 mg | ORAL_TABLET | Freq: Two times a day (BID) | ORAL | Status: DC | PRN

## 2019-02-14 MED ORDER — DOCUSATE SODIUM 100 MG PO CAPS: 200.0000 mg | ORAL_CAPSULE | Freq: Every day | ORAL | 0 refills | Status: DC

## 2019-02-14 NOTE — Progress Notes (Signed)
Presenting complaint;  History of squamous cell carcinoma of anal canal. Hematochezia.  Subjective and subjective:   Patient is 63 year old Caucasian female who was diagnosed with squamous cell carcinoma of the anal canal in May last year when she had a colonoscopy.  She underwent chemoradiation therapy complicated by peripheral neuropathy and Mnire's disease involving the left ear and she feels she is coping well. PET scan in June 2019 revealed area of increased activity involving the left lobe of the thyroid.  Ultrasound and thyroid scan revealed lesion in left lobe suspicious for malignancy.  She ultrasound-guided biopsy in November 2019 and was diagnosed with papillary carcinoma.  She underwent total thyroidectomy on 01/04/2019.   She says she has an appointment to see Dr. Dorris Fetch at this week. Patient states that she was begun on calcium supplement after thyroid surgery and she developed fecal impaction and had to digitally disimpact herself.  Since then she has noted rectal bleeding when her bowels move.  It is small in amount.  She she has stopped calcium.  She denies anorectal or abdominal pain.  She has good appetite.  She is watching her diet so that she would not develop constipation.  She has gained 4 pounds since she was last seen in May 2019. She tells me her sister was also diagnosed with papillary thyroid carcinoma when she was 63 years old.  15 years later she remains in remission.   Current Medications: Outpatient Encounter Medications as of 02/14/2019  Medication Sig  . acetaminophen (TYLENOL) 500 MG tablet Take 1 tablet (500 mg total) by mouth every 6 (six) hours as needed for moderate pain or headache.  Marland Kitchen amLODipine (NORVASC) 5 MG tablet Take 2.5 mg by mouth daily.   . benazepril (LOTENSIN) 10 MG tablet Take 10 mg by mouth daily.  . calcium-vitamin D (OSCAL WITH D) 500-200 MG-UNIT tablet Take 2 tablets by mouth 2 (two) times daily. (Patient taking differently: Take 1 tablet by  mouth daily. )  . diphenhydrAMINE (BENADRYL) 25 mg capsule Take 25 mg by mouth daily as needed for allergies.  . hydrochlorothiazide (MICROZIDE) 12.5 MG capsule Take 12.5 mg by mouth daily.  Marland Kitchen ibuprofen (ADVIL,MOTRIN) 200 MG tablet Take 400 mg by mouth every 6 (six) hours as needed for headache or moderate pain.   Marland Kitchen levothyroxine (SYNTHROID, LEVOTHROID) 100 MCG tablet Take 1 tablet (100 mcg total) by mouth daily before breakfast.  . meclizine (ANTIVERT) 25 MG tablet Take 25 mg by mouth 3 (three) times daily as needed for dizziness.  . phenylephrine-shark liver oil-mineral oil-petrolatum (PREPARATION H) 0.25-3-14-71.9 % rectal ointment Place 1 application rectally 2 (two) times daily as needed for hemorrhoids.  . ranitidine (ZANTAC) 150 MG tablet Take 150 mg by mouth daily.   No facility-administered encounter medications on file as of 02/14/2019.      Objective: Blood pressure 129/87, pulse 88, temperature 98.1 F (36.7 C), temperature source Oral, resp. rate 18, height _0  (1.549 m), weight 125 lb 14.4 oz (57.1 kg). Patient is alert and in no acute distress. Conjunctiva is pink. Sclera is nonicteric Oropharyngeal mucosa is normal. She has thyroid scar. No lymphadenopathy noted. Cardiac exam with regular rhythm normal S1 and S2.  She has faint systolic murmur best heard at aortic area. Lungs are clear to auscultation. Abdomen is symmetrical soft and nontender with organomegaly or masses. No LE edema or clubbing noted.  Labs/studies Results:  CBC Latest Ref Rng & Units 02/01/2019 01/05/2019 12/30/2018  WBC 4.0 - 10.5 K/uL 4.7  10.2 6.6  Hemoglobin 12.0 - 15.0 g/dL 11.7(L) 10.2(L) 12.0  Hematocrit 36.0 - 46.0 % 36.7 32.0(L) 38.2  Platelets 150 - 400 K/uL 252 211 248    CMP Latest Ref Rng & Units 02/01/2019 01/05/2019 01/04/2019  Glucose 70 - 99 mg/dL 104(H) 109(H) 146(H)  BUN 8 - 23 mg/dL _0 Creatinine 0.44 - 1.00 mg/dL 0.93 0.94 1.00  Sodium 135 - 145 mmol/L 139 142 140  Potassium  3.5 - 5.1 mmol/L 3.9 3.9 4.0  Chloride 98 - 111 mmol/L 102 109 107  CO2 22 - 32 mmol/L _1 Calcium 8.9 - 10.3 mg/dL 9.4 8.3(L) 8.5(L)  Total Protein 6.5 - 8.1 g/dL 6.5 5.4(L) 6.0(L)  Total Bilirubin 0.3 - 1.2 mg/dL 0.7 0.6 0.6  Alkaline Phos 38 - 126 U/L 70 56 57  AST 15 - 41 U/L _2 ALT 0 - 44 U/L _3 Hepatic Function Latest Ref Rng & Units 02/01/2019 01/05/2019 01/04/2019  Total Protein 6.5 - 8.1 g/dL 6.5 5.4(L) 6.0(L)  Albumin 3.5 - 5.0 g/dL 3.9 3.1(L) 3.6  AST 15 - 41 U/L _4 ALT 0 - 44 U/L _5 Alk Phosphatase 38 - 126 U/L 70 56 57  Total Bilirubin 0.3 - 1.2 mg/dL 0.7 0.6 0.6     Assessment:  #1.  History of squamous cell carcinoma of the anal canal.  She is status post chemoradiation therapy last year.  Chest and abdominal pelvic CT in October 2019 was unremarkable.  Therefore she would appear to be in remission.  #2.  Hematochezia since she had an episode of fecal impaction.  She was noted to have small external hemorrhoids on her last colonoscopy.  Given her hematochezia she should undergo flexible sigmoidoscopy now rather than in May 2020.   Plan:  Diagnostic/surveillance flexible sigmoidoscopy in near future under monitored anesthesia care. Office visit in 1 year.

## 2019-02-14 NOTE — Progress Notes (Signed)
Endocrinology Consult Note                                            02/14/2019, 11:22 AM   Subjective:    Patient ID: Margaret Castaneda, female    DOB: Mar 20, 1956, PCP Kennieth Rad, MD   Past Medical History:  Diagnosis Date  . Cancer ([place])    Anal Cancer  . History of kidney stones   . Hypertension   . Neuropathy   . Rectal mass 05/18/2018   Past Surgical History:  Procedure Laterality Date  . COLONOSCOPY N/A 05/24/2018   Procedure: COLONOSCOPY;  Surgeon: Rogene Houston, MD;  Location: [facility];  Service: Endoscopy;  Laterality: N/A;  7:30  . reversal tubal ligation    . THYROIDECTOMY N/A 01/04/2019   Procedure: TOTAL THYROIDECTOMY;  Surgeon: Aviva Signs, MD;  Location: [facility];  Service: General;  Laterality: N/A;  . TONSILLECTOMY    . TUBAL LIGATION     Social History   Socioeconomic History  . Marital status: Married    Spouse name: Not on file  . Number of children: Not on file  . Years of education: Not on file  . Highest education level: Not on file  Occupational History  . Not on file  Social Needs  . Financial resource strain: Not on file  . Food insecurity:    Worry: Not on file    Inability: Not on file  . Transportation needs:    Medical: Not on file    Non-medical: Not on file  Tobacco Use  . Smoking status: Never Smoker  . Smokeless tobacco: Never Used  Substance and Sexual Activity  . Alcohol use: Never    Frequency: Never  . Drug use: Never  . Sexual activity: Yes    Birth control/protection: Surgical  Lifestyle  . Physical activity:    Days per week: Not on file    Minutes per session: Not on file  . Stress: Not on file  Relationships  . Social connections:    Talks on phone: Not on file    Gets together: Not on file    Attends religious service: Not on file    Active member of club or organization: Not on file    Attends meetings of clubs or organizations: Not on file    Relationship status: Not on file  Other  Topics Concern  . Not on file  Social History Narrative  . Not on file   Outpatient Encounter Medications as of 02/14/2019  Medication Sig  . amLODipine (NORVASC) 5 MG tablet Take 2.5 mg by mouth daily.   . benazepril (LOTENSIN) 10 MG tablet Take 10 mg by mouth daily.  . calcium-vitamin D (OSCAL WITH D) 500-200 MG-UNIT tablet Take 2 tablets by mouth 2 (two) times daily. (Patient taking differently: Take 1 tablet by mouth daily. )  . hydrochlorothiazide (HYDRODIURIL) 12.5 MG tablet Take 12.5 mg by mouth daily.  Marland Kitchen levothyroxine (SYNTHROID, LEVOTHROID) 100 MCG tablet Take 1 tablet (100 mcg total) by mouth daily before breakfast.  . acetaminophen (TYLENOL) 500 MG tablet Take 1 tablet (500 mg total) by mouth every 6 (six) hours as needed for moderate pain or headache.  . [DISCONTINUED] diphenhydrAMINE (BENADRYL) 25 mg capsule Take 25 mg by mouth daily as needed for allergies.  . [DISCONTINUED] docusate sodium (COLACE)  100 MG capsule Take 2 capsules (200 mg total) by mouth at bedtime.  . [DISCONTINUED] famotidine (PEPCID) 20 MG tablet Take 1 tablet (20 mg total) by mouth 2 (two) times daily as needed for heartburn or indigestion.  . [DISCONTINUED] hydrochlorothiazide (MICROZIDE) 12.5 MG capsule Take 12.5 mg by mouth daily.  . [DISCONTINUED] hydrocortisone (ANUSOL-HC) 25 MG suppository Place 1 suppository (25 mg total) rectally at bedtime.  . [DISCONTINUED] ibuprofen (ADVIL,MOTRIN) 200 MG tablet Take 400 mg by mouth every 6 (six) hours as needed for headache or moderate pain.   . [DISCONTINUED] meclizine (ANTIVERT) 25 MG tablet Take 25 mg by mouth 3 (three) times daily as needed for dizziness.   No facility-administered encounter medications on file as of 02/14/2019.    ALLERGIES: No Known Allergies  VACCINATION STATUS:  There is no immunization history on file for this patient.  HPI Margaret Castaneda is 63 y.o. female who presents today with a medical history as above. she is being seen in  consultation for thyroid malignancy requested by Kennieth Rad, MD.  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  Subsequent biopsy of 0.7 cm nodule in the left lobe of the thyroid in November 2019 revealed papillary thyroid cancer.  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 -She is currently on levothyroxine 100 mcg p.o. every morning as well as low-dose calcium supplement. -She denies any prior exposure to neck radiation, has 1 sister with thyroid malignancy. -She is recovering well from her surgery, compliant to her levothyroxine. -She remains on calcium supplement 500 mg daily, complains of constipation.  Her most recent labs show calcium of 9.4 improving from 8.3 from immediately after her surgery. -She denies palpitations, heat intolerance, tremors.  Review of Systems  Constitutional: no recent weight gain/loss, no fatigue, no subjective hyperthermia, no subjective hypothermia Eyes: no blurry vision, no xerophthalmia ENT: no sore throat, no nodules palpated in throat, no dysphagia/odynophagia, no hoarseness Cardiovascular: no Chest Pain, no Shortness of Breath, no palpitations, no leg swelling Respiratory: no cough, no shortness of breath Gastrointestinal: no Nausea/Vomiting/Diarhhea Musculoskeletal: no muscle/joint aches Skin: no rashes Neurological: no tremors, no numbness, no tingling, no dizziness Psychiatric: no depression, no anxiety  Objective:    BP (!) 146/85   Pulse 84   Ht 5\' 1"  (1.549 m)   Wt 125 lb (56.7 kg)   BMI 23.62 kg/m   Wt Readings from Last 3 Encounters:  02/14/19 125 lb (56.7 kg)  02/14/19 125 lb 14.4 oz (57.1 kg)  02/01/19 124 lb 1.6 oz (56.3 kg)    Physical  Exam  Constitutional: + Appropriate weight for height, not in acute distress, normal state of mind Eyes: PERRLA, EOMI, no exophthalmos ENT: moist mucous membranes, + healing post thyroidectomy scar on anterior lower neck,  no gross cervical lymphadenopathy Cardiovascular: normal precordial activity, Regular Rate and Rhythm, no Murmur/Rubs/Gallops Respiratory:  adequate breathing efforts, no gross chest deformity, Clear to auscultation bilaterally Gastrointestinal: abdomen soft, Non -tender, No distension, Bowel Sounds present, no gross organomegaly Musculoskeletal: no gross deformities, strength intact in all four extremities Skin: moist, warm, + rashes Neurological: no tremor with outstretched hands, Deep tendon reflexes normal in bilateral lower extremities.  CMP ( most recent) CMP  Component Value Date/Time   NA 139 02/01/2019 0927   K 3.9 02/01/2019 0927   CL 102 02/01/2019 0927   CO2 30 02/01/2019 0927   GLUCOSE 104 (H) 02/01/2019 0927   BUN 17 02/01/2019 0927   CREATININE 0.93 02/01/2019 0927   CREATININE 0.95 05/18/2018 1416   CALCIUM 9.4 02/01/2019 0927   PROT 6.5 02/01/2019 0927   ALBUMIN 3.9 02/01/2019 0927   AST 22 02/01/2019 0927   ALT 16 02/01/2019 0927   ALKPHOS 70 02/01/2019 0927   BILITOT 0.7 02/01/2019 0927   GFRNONAA >60 02/01/2019 0927   GFRAA >60 02/01/2019 0927    Lab Results  Component Value Date   TSH 0.062 (L) 02/01/2019   TSH 1.923 12/30/2018      Assessment & Plan:   1. Postsurgical hypothyroidism 2. 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 - I have reviewed her available thyroid records and clinically evaluated the patient. - Based on these reviews, she was incidentally found to have 0.7 cm left thyroid lobe nodule on PET scan, biopsy in November 2019 revealed papillary thyroid cancer.  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    -Given her relative youth, follicular variant papillary thyroid cancer, family history in first-degree relative of thyroid cancer, she will be considered for adjuvant therapy with I-131 thyroid remnant ablation.    -I discussed Thyrogen stimulated I-131 therapy with subsequent whole-body scan with her and she agrees.    -This treatment will be arranged to be administered in next few weeks in nuclear medicine at [facility].   -She will have thyroglobulin level, thyroglobulin antibodies as well as free T4 and TSH before her next visit in 4 weeks.   -Given her constipation, I advised her to hold calcium for now (last calcium was 9.4 mg/dL) until her next blood work which will include CMP.    -Regarding postsurgical hypothyroidism, she is advised to continue levothyroxine 100 mcg p.o. every morning.   - We discussed about the correct intake of her thyroid hormone, on empty stomach at fasting, with water, separated by at least 30 minutes from breakfast and other medications,  and separated by more than 4 hours from calcium, iron, multivitamins, acid reflux medications (PPIs). -Patient is made aware of the fact that thyroid hormone replacement is needed for life, dose to be adjusted by periodic monitoring of thyroid function tests.  - I did not initiate any new prescriptions today. - I advised her  to maintain close follow up with Kennieth Rad, MD for primary care needs.   - Time spent with the patient: 35 minutes, of which >50% was spent in obtaining information about her symptoms, reviewing her previous labs/studies,  evaluations, and treatments, counseling her about her thyroid malignancy (mixed cytology of papillary thyroid cancer as well as follicular variant papillary thyroid cancer), postsurgical hypothyroidism, hypocalcemia, and developing a plan to confirm the diagnosis and long term  treatment based on the latest standards of care/guidelines.    Barbette Reichmann Pellerito participated in the discussions, expressed understanding, and voiced agreement with the above plans.  All questions were answered to her satisfaction. she is encouraged to contact clinic should she have any questions or concerns prior to her return visit.  Follow up plan: Return in about 4 weeks (around 03/14/2019) for  Follow up with Pre-visit Labs, Follow up with Whole Body Scan w/Thyrogen.   Glade Lloyd, MD [facility] [address] [place], [place] [address] Phone: [phone]  Fax: [phone]     02/14/2019, 11:22 AM  This note was partially dictated with voice recognition software. Similar sounding words can be transcribed inadequately or may not  be corrected upon review.

## 2019-02-14 NOTE — Patient Instructions (Signed)
Flexible sigmoidoscopy to be scheduled 

## 2019-02-15 ENCOUNTER — Other Ambulatory Visit (INDEPENDENT_AMBULATORY_CARE_PROVIDER_SITE_OTHER): Payer: Self-pay | Admitting: *Deleted

## 2019-02-15 DIAGNOSIS — C211 Malignant neoplasm of anal canal: Secondary | ICD-10-CM

## 2019-02-20 ENCOUNTER — Telehealth: Payer: Self-pay | Admitting: *Deleted

## 2019-02-20 NOTE — Telephone Encounter (Signed)
Gabriel Cirri from [facility] called stating there is no auth on patient's upcoming test. Please call Sabrina [phone] ext [phone]

## 2019-02-21 NOTE — Telephone Encounter (Signed)
No PA required per [employer]. Ref # [id number]    This info was added to the referal

## 2019-02-22 ENCOUNTER — Encounter (HOSPITAL_COMMUNITY)
Admission: RE | Admit: 2019-02-22 | Discharge: 2019-02-22 | Disposition: A | Discharge status: Home / Self Care | Payer: BLUE CROSS/BLUE SHIELD | Source: Ambulatory Visit | Attending: "Endocrinology | Admitting: "Endocrinology

## 2019-02-22 ENCOUNTER — Encounter (HOSPITAL_COMMUNITY): Payer: Self-pay

## 2019-02-22 DIAGNOSIS — C73 Malignant neoplasm of thyroid gland: Secondary | ICD-10-CM

## 2019-02-22 MED ORDER — THYROTROPIN ALFA 1.1 MG IM SOLR
INTRAMUSCULAR | Status: AC
  Administered 2019-02-22: 0.9 mg via INTRAMUSCULAR
  Filled 2019-02-22: qty 0.9

## 2019-02-22 MED ORDER — STERILE WATER FOR INJECTION IJ SOLN
INTRAMUSCULAR | Status: AC
  Administered 2019-02-22: 1 mL
  Filled 2019-02-22: qty 10

## 2019-02-22 MED ORDER — THYROTROPIN ALFA 1.1 MG IM SOLR
0.9000 mg | INTRAMUSCULAR | Status: AC
  Administered 2019-02-22: 0.9 mg via INTRAMUSCULAR

## 2019-02-23 ENCOUNTER — Encounter (HOSPITAL_COMMUNITY)
Admission: RE | Admit: 2019-02-23 | Discharge: 2019-02-23 | Disposition: A | Discharge status: Home / Self Care | Payer: BLUE CROSS/BLUE SHIELD | Source: Ambulatory Visit | Attending: "Endocrinology | Admitting: "Endocrinology

## 2019-02-23 DIAGNOSIS — C73 Malignant neoplasm of thyroid gland: Secondary | ICD-10-CM

## 2019-02-23 MED ORDER — THYROTROPIN ALFA 1.1 MG IM SOLR
INTRAMUSCULAR | Status: AC
  Administered 2019-02-23: 0.9 mg via INTRAMUSCULAR
  Filled 2019-02-23: qty 0.9

## 2019-02-23 MED ORDER — THYROTROPIN ALFA 1.1 MG IM SOLR
0.9000 mg | INTRAMUSCULAR | Status: AC
  Administered 2019-02-23: 0.9 mg via INTRAMUSCULAR

## 2019-02-23 MED ORDER — STERILE WATER FOR INJECTION IJ SOLN
INTRAMUSCULAR | Status: AC
  Filled 2019-02-23: qty 10

## 2019-02-24 ENCOUNTER — Other Ambulatory Visit (HOSPITAL_COMMUNITY)
Admission: RE | Admit: 2019-02-24 | Discharge: 2019-02-24 | Disposition: A | Discharge status: Home / Self Care | Payer: BLUE CROSS/BLUE SHIELD | Source: Ambulatory Visit | Attending: "Endocrinology | Admitting: "Endocrinology

## 2019-02-24 ENCOUNTER — Encounter (HOSPITAL_COMMUNITY)
Admission: RE | Admit: 2019-02-24 | Discharge: 2019-02-24 | Disposition: A | Discharge status: Home / Self Care | Payer: BLUE CROSS/BLUE SHIELD | Source: Ambulatory Visit | Attending: "Endocrinology | Admitting: "Endocrinology

## 2019-02-24 DIAGNOSIS — C73 Malignant neoplasm of thyroid gland: Secondary | ICD-10-CM | POA: Insufficient documentation

## 2019-02-24 LAB — COMPREHENSIVE METABOLIC PANEL
ALT: 15 U/L (ref 0–44)
AST: 18 U/L (ref 15–41)
Albumin: 4 g/dL (ref 3.5–5.0)
Alkaline Phosphatase: 82 U/L (ref 38–126)
Anion gap: 8 (ref 5–15)
BUN: 18 mg/dL (ref 8–23)
CO2: 28 mmol/L (ref 22–32)
Calcium: 9.3 mg/dL (ref 8.9–10.3)
Chloride: 104 mmol/L (ref 98–111)
Creatinine, Ser: 0.96 mg/dL (ref 0.44–1.00)
GFR calc Af Amer: >60 mL/min (ref >60)
Glucose, Bld: 98 mg/dL (ref 70–99)
Potassium: 3.6 mmol/L (ref 3.5–5.1)
Sodium: 140 mmol/L (ref 135–145)
Total Bilirubin: 0.5 mg/dL (ref 0.3–1.2)
Total Protein: 6.7 g/dL (ref 6.5–8.1)

## 2019-02-24 LAB — TSH: TSH: 238.909 u[IU]/mL — ABNORMAL HIGH (ref 0.350–4.500)

## 2019-02-24 LAB — T4, FREE: Free T4: 1.47 ng/dL (ref 0.82–1.77)

## 2019-02-24 MED ORDER — SODIUM IODIDE I 131 CAPSULE
40.0000 | Freq: Once | INTRAVENOUS | Status: AC | PRN
  Administered 2019-02-24: 39.9 via ORAL

## 2019-03-02 ENCOUNTER — Ambulatory Visit (HOSPITAL_COMMUNITY)
Admission: RE | Admit: 2019-03-02 | Discharge: 2019-03-02 | Disposition: A | Discharge status: Home / Self Care | Payer: BLUE CROSS/BLUE SHIELD | Attending: Internal Medicine | Admitting: Internal Medicine

## 2019-03-02 ENCOUNTER — Encounter (HOSPITAL_COMMUNITY)
Admission: RE | Disposition: A | Discharge status: Home / Self Care | Payer: Self-pay | Source: Home / Self Care | Attending: Internal Medicine

## 2019-03-02 ENCOUNTER — Other Ambulatory Visit: Payer: Self-pay

## 2019-03-02 ENCOUNTER — Encounter (HOSPITAL_COMMUNITY): Payer: Self-pay

## 2019-03-02 DIAGNOSIS — Z79899 Other long term (current) drug therapy: Secondary | ICD-10-CM | POA: Diagnosis not present

## 2019-03-02 DIAGNOSIS — Z79890 Hormone replacement therapy (postmenopausal): Secondary | ICD-10-CM | POA: Diagnosis not present

## 2019-03-02 DIAGNOSIS — C211 Malignant neoplasm of anal canal: Secondary | ICD-10-CM

## 2019-03-02 DIAGNOSIS — E890 Postprocedural hypothyroidism: Secondary | ICD-10-CM | POA: Insufficient documentation

## 2019-03-02 DIAGNOSIS — G629 Polyneuropathy, unspecified: Secondary | ICD-10-CM | POA: Diagnosis not present

## 2019-03-02 DIAGNOSIS — K6289 Other specified diseases of anus and rectum: Secondary | ICD-10-CM | POA: Diagnosis not present

## 2019-03-02 DIAGNOSIS — Z08 Encounter for follow-up examination after completed treatment for malignant neoplasm: Secondary | ICD-10-CM | POA: Diagnosis not present

## 2019-03-02 DIAGNOSIS — K644 Residual hemorrhoidal skin tags: Secondary | ICD-10-CM | POA: Insufficient documentation

## 2019-03-02 DIAGNOSIS — I10 Essential (primary) hypertension: Secondary | ICD-10-CM | POA: Diagnosis not present

## 2019-03-02 DIAGNOSIS — Z8249 Family history of ischemic heart disease and other diseases of the circulatory system: Secondary | ICD-10-CM | POA: Insufficient documentation

## 2019-03-02 DIAGNOSIS — K5520 Angiodysplasia of colon without hemorrhage: Secondary | ICD-10-CM | POA: Diagnosis not present

## 2019-03-02 DIAGNOSIS — Z85048 Personal history of other malignant neoplasm of rectum, rectosigmoid junction, and anus: Secondary | ICD-10-CM | POA: Diagnosis not present

## 2019-03-02 DIAGNOSIS — Z1211 Encounter for screening for malignant neoplasm of colon: Secondary | ICD-10-CM | POA: Insufficient documentation

## 2019-03-02 HISTORY — PX: FLEXIBLE SIGMOIDOSCOPY: SHX5431

## 2019-03-02 SURGERY — SIGMOIDOSCOPY, FLEXIBLE
Anesthesia: Moderate Sedation

## 2019-03-02 MED ORDER — MIDAZOLAM HCL 5 MG/5ML IJ SOLN
INTRAMUSCULAR | Status: DC | PRN
  Administered 2019-03-02: 2 mg via INTRAVENOUS
  Administered 2019-03-02: 1 mg via INTRAVENOUS
  Administered 2019-03-02: 2 mg via INTRAVENOUS

## 2019-03-02 MED ORDER — MIDAZOLAM HCL 5 MG/5ML IJ SOLN
INTRAMUSCULAR | Status: AC
  Filled 2019-03-02: qty 10

## 2019-03-02 MED ORDER — MEPERIDINE HCL 100 MG/ML IJ SOLN
INTRAMUSCULAR | Status: AC
  Filled 2019-03-02: qty 1

## 2019-03-02 MED ORDER — STERILE WATER FOR IRRIGATION IR SOLN
Status: DC | PRN
  Administered 2019-03-02: 1.5 mL

## 2019-03-02 MED ORDER — MEPERIDINE HCL 25 MG/ML IJ SOLN
INTRAMUSCULAR | Status: DC | PRN
  Administered 2019-03-02 (×2): 25 mg via INTRAVENOUS

## 2019-03-02 MED ORDER — SODIUM CHLORIDE 0.9 % IV SOLN
INTRAVENOUS | Status: DC
  Administered 2019-03-02: 14:00:00 via INTRAVENOUS

## 2019-03-02 NOTE — Op Note (Signed)
[facility] Patient Name: Margaret Castaneda Procedure Date: 03/02/2019 2:47 PM MRN: [id number] Date of Birth: 02/01/1956 Attending MD: Hildred Laser , MD CSN: [id number] Age: 63 Admit Type: Outpatient Procedure:                Flexible Sigmoidoscopy Indications:              High risk colon cancer surveillance: Personal                            history of anal cancer Providers:                Hildred Laser, MD, Charlsie Quest. Theda Sers RN, RN, Aram Candela Referring MD:             Derek Jack, MD and Kennieth Rad, MD (PCP) Medicines:                Meperidine 50 mg IV, Midazolam 5 mg IV Complications:            No immediate complications. Estimated Blood Loss:     Estimated blood loss: none. Procedure:                Pre-Anesthesia Assessment:                           - Prior to the procedure, a History and Physical                            was performed, and patient medications and                            allergies were reviewed. The patient's tolerance of                            previous anesthesia was also reviewed. The risks                            and benefits of the procedure and the sedation                            options and risks were discussed with the patient.                            All questions were answered, and informed consent                            was obtained. Prior Anticoagulants: The patient has                            taken no previous anticoagulant or antiplatelet                            agents. ASA Grade Assessment: II - A patient with  mild systemic disease. After reviewing the risks                            and benefits, the patient was deemed in                            satisfactory condition to undergo the procedure.                           After obtaining informed consent, the scope was                            passed under direct vision. The [id number]-[id number]                       ([id number]) was introduced through the anus and                            advanced to the the splenic flexure. The flexible                            sigmoidoscopy was accomplished without difficulty.                            The patient tolerated the procedure well. The                            quality of the bowel preparation was excellent. Scope In: 3:07:01 PM Scope Out: 3:11:06 PM Total Procedure Duration: 0 hours 4 minutes 5 seconds  Findings:      Skin tags were found on perianal exam.      The digital rectal exam was normal.      The recto-sigmoid colon, sigmoid colon and descending colon appeared       normal.      Multiple small angioectasias without bleeding were found in the rectum.      Anal papilla(e) were hypertrophied. Impression:               - Perianal skin tags found on perianal exam.                           - The recto-sigmoid colon, sigmoid colon and                            descending colon are normal.                           - Multiple non-bleeding colonic angioectasias.                           - Anal papilla(e) were hypertrophied.                           - No specimens collected. Moderate Sedation:      Moderate (conscious) sedation was administered by the endoscopy nurse       and supervised by the endoscopist. The following parameters  were       monitored: oxygen saturation, heart rate, blood pressure, CO2       capnography and response to care. Total physician intraservice time was       9 minutes. Recommendation:           - Discharge patient to home (with spouse).                           - Continue present medications.                           - Repeat flexible sigmoidoscopy in ? 2 years for                            surveillance. Procedure Code(s):        --- Professional ---                           [phone], Sigmoidoscopy, flexible; diagnostic,                            including collection of specimen(s) by  brushing or                            washing, when performed (separate procedure) Diagnosis Code(s):        --- Professional ---                           U54.270, Personal history of other malignant                            neoplasm of rectum, rectosigmoid junction, and anus                           K55.20, Angiodysplasia of colon without hemorrhage                           K62.89, Other specified diseases of anus and rectum                           K64.4, Residual hemorrhoidal skin tags CPT copyright 2018 American Medical Association. All rights reserved. The codes documented in this report are preliminary and upon coder review may  be revised to meet current compliance requirements. Hildred Laser, MD Hildred Laser, MD 03/02/2019 3:19:43 PM This report has been signed electronically. Number of Addenda: 0

## 2019-03-02 NOTE — Discharge Instructions (Signed)
Resume usual medications and diet as before. No driving for 24 hours. Follow-up with Dr. Delton Coombes as planned.   Colonoscopy, Adult, Care After This sheet gives you information about how to care for yourself after your procedure. Your doctor may also give you more specific instructions. If you have problems or questions, call your doctor. What can I expect after the procedure? After the procedure, it is common to have:  A small amount of blood in your poop for 24 hours.  Some gas.  Mild cramping or bloating in your belly. Follow these instructions at home: General instructions  For the first 24 hours after the procedure: ? Do not drive or use machinery. ? Do not sign important documents. ? Do not drink alcohol. ? Do your daily activities more slowly than normal. ? Eat foods that are soft and easy to digest.  Take over-the-counter or prescription medicines only as told by your doctor. To help cramping and bloating:   Try walking around.  Put heat on your belly (abdomen) as told by your doctor. Use a heat source that your doctor recommends, such as a moist heat pack or a heating pad. ? Put a towel between your skin and the heat source. ? Leave the heat on for 20-30 minutes. ? Remove the heat if your skin turns bright red. This is especially important if you cannot feel pain, heat, or cold. You can get burned. Eating and drinking   Drink enough fluid to keep your pee (urine) clear or pale yellow.  Return to your normal diet as told by your doctor. Avoid heavy or fried foods that are hard to digest.  Avoid drinking alcohol for as long as told by your doctor. Contact a doctor if:  You have blood in your poop (stool) 2-3 days after the procedure. Get help right away if:  You have more than a small amount of blood in your poop.  You see large clumps of tissue (blood clots) in your poop.  Your belly is swollen.  You feel sick to your stomach (nauseous).  You throw up  (vomit).  You have a fever.  You have belly pain that gets worse, and medicine does not help your pain. Summary  After the procedure, it is common to have a small amount of blood in your poop. You may also have mild cramping and bloating in your belly.  For the first 24 hours after the procedure, do not drive or use machinery, do not sign important documents, and do not drink alcohol.  Get help right away if you have a lot of blood in your poop, feel sick to your stomach, have a fever, or have more belly pain. This information is not intended to replace advice given to you by your health care provider. Make sure you discuss any questions you have with your health care provider. Document Released: 01/16/2011 Document Revised: 10/14/2017 Document Reviewed: 09/07/2016 Elsevier Interactive Patient Education  2019 [employer].

## 2019-03-02 NOTE — H&P (Signed)
Margaret Castaneda is an 63 y.o. female.   Chief Complaint: Patient is here for sigmoidoscopy. 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  She is here for surveillance examination.  There is no history of rectal bleeding abdominal or anorectal pain.  Past Medical History:  Diagnosis Date  . Cancer ([place])    Anal Cancer  . History of kidney stones   . Hypertension   . Neuropathy   . Rectal mass 05/18/2018    Past Surgical History:  Procedure Laterality Date  . COLONOSCOPY N/A 05/24/2018   Procedure: COLONOSCOPY;  Surgeon: Rogene Houston, MD;  [facility];  Service: Endoscopy;  Laterality: N/A;  7:30  . reversal tubal ligation    . THYROIDECTOMY N/A 01/04/2019   Procedure: TOTAL THYROIDECTOMY;  Surgeon: Aviva Signs, MD;  Location: [facility]S;  Service: General;  Laterality: N/A;  . TONSILLECTOMY    . TUBAL LIGATION      Family History  Problem Relation Age of Onset  . Gastric cancer Paternal Grandfather   . Hypertension Mother   . Kidney cancer Father   . Stroke Father   . Heart attack Father   . Thyroid cancer Sister   . Hypertension Brother   . Breast cancer Maternal Grandmother   . Colon cancer Neg Hx    Social History:  reports that she has never smoked. She has never used smokeless tobacco. She reports that she does not drink alcohol or use drugs.  Allergies: No Known Allergies  Medications Prior to Admission  Medication Sig Dispense Refill  . acetaminophen (TYLENOL) 500 MG tablet Take 1 tablet (500 mg total) by mouth every 6 (six) hours as needed for moderate pain or headache. 30 tablet 0  . amLODipine (NORVASC) 5 MG tablet Take 2.5 mg by mouth daily.   4  . benazepril (LOTENSIN) 10 MG tablet Take 10 mg by mouth daily.    Marland Kitchen docusate sodium (COLACE) 100 MG capsule Take 100 mg by mouth at bedtime.    . famotidine (PEPCID) 10 MG tablet Take 10 mg by mouth 2 (two) times daily as  needed for heartburn or indigestion.    . hydrochlorothiazide (HYDRODIURIL) 12.5 MG tablet Take 12.5 mg by mouth daily.    . hydrocortisone (ANUSOL-HC) 2.5 % rectal cream Place 1 application rectally 2 (two) times daily as needed for hemorrhoids or anal itching.    Marland Kitchen Hydrocortisone (PREPARATION H EX) Place 1 application rectally 2 (two) times daily as needed (hemmorroids).    Marland Kitchen levothyroxine (SYNTHROID, LEVOTHROID) 100 MCG tablet Take 1 tablet (100 mcg total) by mouth daily before breakfast. 90 tablet 0  . calcium-vitamin D (OSCAL WITH D) 500-200 MG-UNIT tablet Take 2 tablets by mouth 2 (two) times daily. (Patient taking differently: Take 1 tablet by mouth daily. ) 120 tablet 1    No results found for this or any previous visit (from the past 48 hour(s)). No results found.  ROS  Blood pressure (!) 142/73, pulse 81, temperature 98.4 F (36.9 C), temperature source Oral, resp. rate 15, SpO2 97 %. Physical Exam  Constitutional: She appears well-developed and well-nourished.  HENT:  Mouth/Throat: Oropharynx is clear and moist.  Eyes: Conjunctivae are normal. No scleral icterus.  Neck: No thyromegaly present.  Cardiovascular: Normal rate, regular rhythm and normal heart sounds.  No murmur heard. Respiratory: Effort normal and breath sounds normal.  GI:  Abdomen is symmetrical.  She  has Pfannenstiel scar.  Abdomen is soft and nontender without organomegaly  or masses.  Musculoskeletal:        General: No edema.  Neurological: She is alert.  Skin: Skin is warm and dry.     Assessment/Plan History of squamous cell carcinoma of the anal canal. Surveillance flexible sigmoidoscopy.  Hildred Laser, MD 03/02/2019, 2:58 PM

## 2019-03-06 ENCOUNTER — Other Ambulatory Visit (HOSPITAL_COMMUNITY)
Admission: RE | Admit: 2019-03-06 | Discharge: 2019-03-06 | Disposition: A | Discharge status: Home / Self Care | Payer: BLUE CROSS/BLUE SHIELD | Source: Ambulatory Visit | Attending: "Endocrinology | Admitting: "Endocrinology

## 2019-03-06 ENCOUNTER — Encounter (HOSPITAL_COMMUNITY)
Admission: RE | Admit: 2019-03-06 | Discharge: 2019-03-06 | Disposition: A | Discharge status: Home / Self Care | Payer: BLUE CROSS/BLUE SHIELD | Source: Ambulatory Visit | Attending: "Endocrinology | Admitting: "Endocrinology

## 2019-03-06 ENCOUNTER — Other Ambulatory Visit: Payer: Self-pay

## 2019-03-06 DIAGNOSIS — C73 Malignant neoplasm of thyroid gland: Secondary | ICD-10-CM | POA: Insufficient documentation

## 2019-03-06 LAB — COMPREHENSIVE METABOLIC PANEL
ALT: 16 U/L (ref 0–44)
AST: 20 U/L (ref 15–41)
Albumin: 3.9 g/dL (ref 3.5–5.0)
Alkaline Phosphatase: 77 U/L (ref 38–126)
Anion gap: 8 (ref 5–15)
BUN: 13 mg/dL (ref 8–23)
CALCIUM: 9.1 mg/dL (ref 8.9–10.3)
CO2: 29 mmol/L (ref 22–32)
Chloride: 103 mmol/L (ref 98–111)
Creatinine, Ser: 0.95 mg/dL (ref 0.44–1.00)
GFR calc Af Amer: >60 mL/min (ref >60)
GFR calc non Af Amer: >60 mL/min (ref >60)
Glucose, Bld: 102 mg/dL — ABNORMAL HIGH (ref 70–99)
Potassium: 3.7 mmol/L (ref 3.5–5.1)
Sodium: 140 mmol/L (ref 135–145)
Total Bilirubin: 0.7 mg/dL (ref 0.3–1.2)
Total Protein: 6.5 g/dL (ref 6.5–8.1)

## 2019-03-06 LAB — TSH: TSH: 0.411 u[IU]/mL (ref 0.350–4.500)

## 2019-03-06 LAB — T4, FREE: Free T4: 1.46 ng/dL (ref 0.82–1.77)

## 2019-03-07 LAB — THYROGLOBULIN ANTIBODY: Thyroglobulin Antibody: <1 IU/mL (ref 0.0–0.9)

## 2019-03-08 ENCOUNTER — Encounter (HOSPITAL_COMMUNITY): Payer: Self-pay | Admitting: Internal Medicine

## 2019-03-11 LAB — THYROGLOBULIN LEVEL: Thyroglobulin: 23 ng/mL

## 2019-03-14 ENCOUNTER — Ambulatory Visit: Payer: BLUE CROSS/BLUE SHIELD | Admitting: "Endocrinology

## 2019-03-23 ENCOUNTER — Encounter: Payer: Self-pay | Admitting: "Endocrinology

## 2019-03-23 ENCOUNTER — Ambulatory Visit (INDEPENDENT_AMBULATORY_CARE_PROVIDER_SITE_OTHER): Payer: BLUE CROSS/BLUE SHIELD | Admitting: "Endocrinology

## 2019-03-23 DIAGNOSIS — C73 Malignant neoplasm of thyroid gland: Secondary | ICD-10-CM

## 2019-03-23 DIAGNOSIS — E890 Postprocedural hypothyroidism: Secondary | ICD-10-CM | POA: Diagnosis not present

## 2019-03-23 MED ORDER — LEVOTHYROXINE SODIUM 100 MCG PO TABS: 100.0000 ug | ORAL_TABLET | Freq: Every day | ORAL | 1 refills | Status: DC

## 2019-03-23 NOTE — Progress Notes (Signed)
03/23/2019, 10:22 AM                                                    Endocrinology Telephone Visit Follow up Note -During COVID -19 Pandemic   Subjective:    Patient ID: Margaret Castaneda, female    DOB: 04/04/1956, PCP Kennieth Rad, MD   Past Medical History:  Diagnosis Date  . Cancer ([place])    Anal Cancer  . History of kidney stones   . Hypertension   . Neuropathy   . Rectal mass 05/18/2018   Past Surgical History:  Procedure Laterality Date  . COLONOSCOPY N/A 05/24/2018   Procedure: COLONOSCOPY;  Surgeon: Rogene Houston, MD;  [facility];  Service: Endoscopy;  Laterality: N/A;  7:30  . FLEXIBLE SIGMOIDOSCOPY N/A 03/02/2019   Procedure: FLEXIBLE SIGMOIDOSCOPY;  Surgeon: Rogene Houston, MD;  Location: [facility];  Service: Endoscopy;  Laterality: N/A;  245  . reversal tubal ligation    . THYROIDECTOMY N/A 01/04/2019   Procedure: TOTAL THYROIDECTOMY;  Surgeon: Aviva Signs, MD;  Location: [facility];  Service: General;  Laterality: N/A;  . TONSILLECTOMY    . TUBAL LIGATION     Social History   Socioeconomic History  . Marital status: Married    Spouse name: Not on file  . Number of children: Not on file  . Years of education: Not on file  . Highest education level: Not on file  Occupational History  . Not on file  Social Needs  . Financial resource strain: Not on file  . Food insecurity:    Worry: Not on file    Inability: Not on file  . Transportation needs:    Medical: Not on file    Non-medical: Not on file  Tobacco Use  . Smoking status: Never Smoker  . Smokeless tobacco: Never Used  Substance and Sexual Activity  . Alcohol use: Never    Frequency: Never  . Drug use: Never  . Sexual activity: Yes    Birth control/protection: Surgical  Lifestyle  . Physical activity:    Days per week: Not on file    Minutes per session: Not on file  . Stress: Not on file  Relationships  . Social  connections:    Talks on phone: Not on file    Gets together: Not on file    Attends religious service: Not on file    Active member of club or organization: Not on file    Attends meetings of clubs or organizations: Not on file    Relationship status: Not on file  Other Topics Concern  . Not on file  Social History Narrative  . Not on file   Outpatient Encounter Medications as of 03/23/2019  Medication Sig  . acetaminophen (TYLENOL) 500 MG tablet Take 1 tablet (500 mg total) by mouth every 6 (six) hours as needed for moderate pain or headache.  Marland Kitchen amLODipine (NORVASC) 5 MG tablet Take 2.5 mg by mouth daily.   . benazepril (LOTENSIN) 10 MG tablet Take 10  mg by mouth daily.  . calcium-vitamin D (OSCAL WITH D) 500-200 MG-UNIT tablet Take 2 tablets by mouth 2 (two) times daily. (Patient taking differently: Take 1 tablet by mouth daily. )  . docusate sodium (COLACE) 100 MG capsule Take 100 mg by mouth at bedtime.  . famotidine (PEPCID) 10 MG tablet Take 10 mg by mouth 2 (two) times daily as needed for heartburn or indigestion.  . hydrochlorothiazide (HYDRODIURIL) 12.5 MG tablet Take 12.5 mg by mouth daily.  . hydrocortisone (ANUSOL-HC) 2.5 % rectal cream Place 1 application rectally 2 (two) times daily as needed for hemorrhoids or anal itching.  Marland Kitchen Hydrocortisone (PREPARATION H EX) Place 1 application rectally 2 (two) times daily as needed (hemmorroids).  Marland Kitchen levothyroxine (SYNTHROID, LEVOTHROID) 100 MCG tablet Take 1 tablet (100 mcg total) by mouth daily before breakfast.  . [DISCONTINUED] levothyroxine (SYNTHROID, LEVOTHROID) 100 MCG tablet Take 1 tablet (100 mcg total) by mouth daily before breakfast.   No facility-administered encounter medications on file as of 03/23/2019.    ALLERGIES: No Known Allergies  VACCINATION STATUS:  There is no immunization history on file for this patient.  HPI Margaret Castaneda is 63 y.o. 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  Subsequent biopsy of 0.7 cm nodule in the left lobe of the thyroid in November 2019 revealed papillary thyroid cancer.  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  -Her Thyrogen stimulated whole-body scan after thyroid remnant ablation was negative for distant metastasis. -She is currently on levothyroxine 100 mcg p.o. every morning as well as low-dose calcium supplement. -She denies any prior exposure to neck radiation, has 1 sister with thyroid malignancy. -She is recovering well from her surgery, compliant to her levothyroxine. -She remains on calcium supplement 500 mg daily, complains of constipation.  Her most recent labs show calcium of 9.4 improving from 8.3 from immediately after her surgery. -She denies palpitations, heat intolerance, tremors.    Objective:    There were no vitals taken for this visit.  Wt Readings from Last 3 Encounters:  02/14/19 125 lb (56.7 kg)  02/14/19 125 lb 14.4 oz (57.1 kg)  02/01/19 124 lb 1.6 oz (56.3 kg)    Physical Exam   CMP     Component Value Date/Time   NA 140 03/06/2019 0919   K 3.7 03/06/2019 0919   CL 103 03/06/2019 0919   CO2 29 03/06/2019 0919   GLUCOSE 102 (H) 03/06/2019 0919   BUN 13 03/06/2019 0919   CREATININE 0.95 03/06/2019 0919   CREATININE 0.95 05/18/2018 1416   CALCIUM 9.1 03/06/2019 0919   PROT 6.5 03/06/2019 0919   ALBUMIN 3.9 03/06/2019 0919   AST 20 03/06/2019 0919   ALT 16 03/06/2019 0919   ALKPHOS 77 03/06/2019 0919   BILITOT 0.7 03/06/2019 0919   GFRNONAA >60  03/06/2019 0919   GFRAA >60 03/06/2019 0919    Lab Results  Component Value Date   TSH 0.411 03/06/2019   TSH 238.909 (H) 02/24/2019   TSH 0.062 (L) 02/01/2019   TSH 1.923 12/30/2018  FREET4 1.46 03/06/2019   FREET4 1.47 02/24/2019      Assessment & Plan:   1. Postsurgical hypothyroidism 2. 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  She is advised to continue levothyroxine 100 mcg p.o. before breakfast.   - We discussed about the correct intake of her thyroid hormone, on empty stomach at fasting, with water, separated by at least 30 minutes from breakfast and other medications,  and separated by more than 4 hours from calcium, iron, multivitamins, acid reflux medications (PPIs). -Patient is made aware of the fact that thyroid hormone replacement is needed for life, dose to be adjusted by periodic monitoring of thyroid function tests.  -She is status post I-131 thyroid remnant ablation with negative post therapy whole-body scan for distant metastasis.  -She has detectable thyroglobulin level 23, with undetectable thyroglobulin antibodies.  She will need subsequent neck imaging with ultrasound after her next visit.  - I advised her  to maintain close follow up with Kennieth Rad, MD for primary care needs.   Follow up plan: Return in about 6 months (around 09/23/2019) for Follow up with Pre-visit Labs.   Glade Lloyd, MD [facility] [address] [place], [place] [address] Phone: [phone]  Fax: [phone]     03/23/2019, 10:22 AM  This note was partially dictated with voice recognition software. Similar sounding words can be transcribed inadequately or may not  be corrected upon review.

## 2019-04-04 ENCOUNTER — Other Ambulatory Visit: Payer: Self-pay | Admitting: "Endocrinology

## 2019-04-04 DIAGNOSIS — E890 Postprocedural hypothyroidism: Secondary | ICD-10-CM

## 2019-04-05 ENCOUNTER — Telehealth: Payer: Self-pay | Admitting: "Endocrinology

## 2019-04-05 ENCOUNTER — Other Ambulatory Visit: Payer: Self-pay | Admitting: "Endocrinology

## 2019-04-05 MED ORDER — LEVOTHYROXINE SODIUM 88 MCG PO TABS: 88.0000 ug | ORAL_TABLET | Freq: Every day | ORAL | 3 refills | Status: DC

## 2019-04-05 NOTE — Telephone Encounter (Signed)
Please look under media tab. Pt did TSh, Free T4 early due to palpitations, and feeling jittery.

## 2019-04-05 NOTE — Telephone Encounter (Signed)
Pt.notified

## 2019-04-05 NOTE — Telephone Encounter (Signed)
Correct, she is thyrotoxic.  She will need adjustment in the dose of her levothyroxine to 88 mcg.  I will send a new prescription to her pharmacy.

## 2019-05-02 ENCOUNTER — Telehealth: Payer: Self-pay | Admitting: "Endocrinology

## 2019-05-02 NOTE — Telephone Encounter (Signed)
Patient called and said the new med 88 micrograms is causing heart flutter and headache, she had blood work done and faxed it to you - please call her

## 2019-05-03 ENCOUNTER — Telehealth: Payer: Self-pay | Admitting: "Endocrinology

## 2019-05-03 NOTE — Telephone Encounter (Signed)
Message sent to Dr Dorris Fetch

## 2019-05-03 NOTE — Telephone Encounter (Signed)
Pt notified. Order faxed.

## 2019-05-03 NOTE — Telephone Encounter (Signed)
Pt states she feels like she has an irregular heartbeat, fluttering feeling, no chest pains, no other symtoms. She did change to the Levothyroxine 27mcg. Please advise.

## 2019-05-03 NOTE — Telephone Encounter (Signed)
I want her to go to lab for TSH, Free T4.

## 2019-05-04 ENCOUNTER — Other Ambulatory Visit: Payer: Self-pay | Admitting: "Endocrinology

## 2019-05-04 ENCOUNTER — Telehealth: Payer: Self-pay | Admitting: "Endocrinology

## 2019-05-04 MED ORDER — LEVOTHYROXINE SODIUM 75 MCG PO TABS: 75.0000 ug | ORAL_TABLET | Freq: Every day | ORAL | 3 refills | Status: DC

## 2019-05-04 NOTE — Telephone Encounter (Signed)
Pt.notified

## 2019-05-04 NOTE — Telephone Encounter (Signed)
Her thyroid labs are suggestive of slight over replacement. I will lower her Levothyroxine to 75 mcg po qam.

## 2019-05-04 NOTE — Telephone Encounter (Signed)
Pt did labs yesterday due to irregular HR. These are scanned into pts chart.

## 2019-05-31 ENCOUNTER — Other Ambulatory Visit: Payer: Self-pay

## 2019-06-01 ENCOUNTER — Other Ambulatory Visit: Payer: Self-pay

## 2019-06-01 ENCOUNTER — Inpatient Hospital Stay (HOSPITAL_COMMUNITY): Payer: BC Managed Care – PPO

## 2019-06-01 ENCOUNTER — Inpatient Hospital Stay (HOSPITAL_COMMUNITY): Payer: BC Managed Care – PPO | Attending: Hematology | Admitting: Hematology

## 2019-06-01 ENCOUNTER — Encounter (HOSPITAL_COMMUNITY): Payer: Self-pay | Admitting: Hematology

## 2019-06-01 VITALS — BP 139/82 | HR 88 | Temp 98.9°F | Resp 18 | Wt 122.1 lb

## 2019-06-01 DIAGNOSIS — C211 Malignant neoplasm of anal canal: Secondary | ICD-10-CM

## 2019-06-01 DIAGNOSIS — C73 Malignant neoplasm of thyroid gland: Secondary | ICD-10-CM

## 2019-06-01 DIAGNOSIS — H8102 Meniere's disease, left ear: Secondary | ICD-10-CM

## 2019-06-01 LAB — CBC WITH DIFFERENTIAL/PLATELET
Abs Immature Granulocytes: 0.02 10*3/uL (ref 0.00–0.07)
Basophils Absolute: 0 10*3/uL (ref 0.0–0.1)
Basophils Relative: 1 %
Eosinophils Absolute: 0.2 10*3/uL (ref 0.0–0.5)
Eosinophils Relative: 3 %
HCT: 38 % (ref 36.0–46.0)
Hemoglobin: 12.5 g/dL (ref 12.0–15.0)
Immature Granulocytes: 0 %
Lymphocytes Relative: 26 %
Lymphs Abs: 1.5 10*3/uL (ref 0.7–4.0)
MCH: 30.1 pg (ref 26.0–34.0)
MCHC: 32.9 g/dL (ref 30.0–36.0)
MCV: 91.6 fL (ref 80.0–100.0)
Monocytes Absolute: 0.6 10*3/uL (ref 0.1–1.0)
Monocytes Relative: 10 %
Neutro Abs: 3.7 10*3/uL (ref 1.7–7.7)
Neutrophils Relative %: 60 %
Platelets: 253 10*3/uL (ref 150–400)
RBC: 4.15 MIL/uL (ref 3.87–5.11)
RDW: 13.2 % (ref 11.5–15.5)
WBC: 6.1 10*3/uL (ref 4.0–10.5)
nRBC: 0 % (ref 0.0–0.2)

## 2019-06-01 LAB — COMPREHENSIVE METABOLIC PANEL
ALT: 15 U/L (ref 0–44)
AST: 21 U/L (ref 15–41)
Albumin: 4.3 g/dL (ref 3.5–5.0)
Alkaline Phosphatase: 95 U/L (ref 38–126)
Anion gap: 13 (ref 5–15)
BUN: 12 mg/dL (ref 8–23)
CO2: 28 mmol/L (ref 22–32)
Calcium: 9.3 mg/dL (ref 8.9–10.3)
Chloride: 101 mmol/L (ref 98–111)
Creatinine, Ser: 0.98 mg/dL (ref 0.44–1.00)
GFR calc Af Amer: >60 mL/min (ref >60)
GFR calc non Af Amer: >60 mL/min (ref >60)
Glucose, Bld: 95 mg/dL (ref 70–99)
Potassium: 3.8 mmol/L (ref 3.5–5.1)
Sodium: 142 mmol/L (ref 135–145)
Total Bilirubin: 0.5 mg/dL (ref 0.3–1.2)
Total Protein: 6.9 g/dL (ref 6.5–8.1)

## 2019-06-01 LAB — TSH: TSH: 0.052 u[IU]/mL — ABNORMAL LOW (ref 0.350–4.500)

## 2019-06-01 NOTE — Progress Notes (Signed)
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   BRIEF ONCOLOGIC HISTORY:    Primary cancer of anal canal ([place])   06/02/2018 Initial Diagnosis    Primary cancer of anal canal ([place])    06/09/2018 -  Chemotherapy    The patient had ondansetron (ZOFRAN) 8 mg in sodium chloride 0.9 % 50 mL IVPB, , Intravenous,  Once, 1 of 1 cycle Administration:  (07/05/2018),  (08/01/2018) mitoMYcin (MUTAMYCIN) chemo injection 15.5 mg, 10 mg/m2 = 15.5 mg, Intravenous,  Once, 1 of 1 cycle Administration: 15.5 mg (07/05/2018), 15.5 mg (08/01/2018)  for chemotherapy treatment.       CANCER STAGING: Cancer Staging No matching staging information was found for the patient.   INTERVAL HISTORY:  Margaret Castaneda 63 y.o. female returns for routine follow-up. She states that she has been doing great since her last visit. She states that she has some rectal bleeding if she misses her stool softener or has to strain for a BM. Denies any nausea, vomiting, or diarrhea. Denies any new pains. Had not noticed any recent bleeding such as epistaxis, hematuria or hematochezia.  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  Denies recent chest pain on exertion, shortness of breath on minimal exertion, pre-syncopal episodes, or palpitations. Denies any numbness or tingling in hands or feet. Denies any recent fevers, infections, or recent hospitalizations. Patient reports appetite at 100% and energy level at 100%.    REVIEW OF SYSTEMS:  Review of Systems  HENT:   Positive for tinnitus.   All other systems reviewed and are negative.    PAST MEDICAL/SURGICAL HISTORY:  Past Medical History:  Diagnosis Date  . Cancer ([place])    Anal Cancer  . History of kidney  stones   . Hypertension   . Neuropathy   . Rectal mass 05/18/2018   Past Surgical History:  Procedure Laterality Date  . COLONOSCOPY N/A 05/24/2018   Procedure: COLONOSCOPY;  Surgeon: Rogene Houston, MD;  [facility];  Service: Endoscopy;  Laterality: N/A;  7:30  . FLEXIBLE SIGMOIDOSCOPY N/A 03/02/2019   Procedure: FLEXIBLE SIGMOIDOSCOPY;  Surgeon: Rogene Houston, MD;  [facility];  Service: Endoscopy;  Laterality: N/A;  245  . reversal tubal ligation    . SIGMOIDOSCOPY  01/2019  . THYROIDECTOMY N/A 01/04/2019   Procedure: TOTAL THYROIDECTOMY;  Surgeon: Aviva Signs, MD;  Location: [facility];  Service: General;  Laterality: N/A;  . TONSILLECTOMY    . TUBAL LIGATION       SOCIAL HISTORY:  Social History   Socioeconomic History  . Marital status: Married    Spouse name: Not on file  . Number of children: Not on file  . Years of education: Not on file  . Highest education level: Not on file  Occupational History  . Not on file  Social Needs  . Financial resource strain: Not on file  . Food insecurity:    Worry: Not on file    Inability: Not on file  . Transportation needs:    Medical: Not on file    Non-medical: Not on file  Tobacco Use  . Smoking status: Never Smoker  . Smokeless tobacco: Never Used  Substance and Sexual Activity  . Alcohol  use: Never    Frequency: Never  . Drug use: Never  . Sexual activity: Yes    Birth control/protection: Surgical  Lifestyle  . Physical activity:    Days per week: Not on file    Minutes per session: Not on file  . Stress: Not on file  Relationships  . Social connections:    Talks on phone: Not on file    Gets together: Not on file    Attends religious service: Not on file    Active member of club or organization: Not on file    Attends meetings of clubs or organizations: Not on file    Relationship status: Not on file  . Intimate partner violence:    Fear of current or ex partner: Not on file     Emotionally abused: Not on file    Physically abused: Not on file    Forced sexual activity: Not on file  Other Topics Concern  . Not on file  Social History Narrative  . Not on file    FAMILY HISTORY:  Family History  Problem Relation Age of Onset  . Gastric cancer Paternal Grandfather   . Hypertension Mother   . Kidney cancer Father   . Stroke Father   . Heart attack Father   . Thyroid cancer Sister   . Hypertension Brother   . Breast cancer Maternal Grandmother   . Colon cancer Neg Hx     CURRENT MEDICATIONS:  Outpatient Encounter Medications as of 06/01/2019  Medication Sig  . acetaminophen (TYLENOL) 500 MG tablet Take 1 tablet (500 mg total) by mouth every 6 (six) hours as needed for moderate pain or headache.  Marland Kitchen amLODipine (NORVASC) 5 MG tablet Take 2.5 mg by mouth daily.   . benazepril (LOTENSIN) 10 MG tablet Take 10 mg by mouth daily.  . diphenhydrAMINE (BENADRYL) 25 MG tablet Take 25 mg by mouth as needed.  . docusate sodium (COLACE) 100 MG capsule Take 100 mg by mouth at bedtime.  . famotidine (PEPCID) 10 MG tablet Take 10 mg by mouth 2 (two) times daily as needed for heartburn or indigestion.  . hydrochlorothiazide (HYDRODIURIL) 12.5 MG tablet Take 12.5 mg by mouth daily.  . hydrocortisone (ANUSOL-HC) 2.5 % rectal cream Place 1 application rectally 2 (two) times daily as needed for hemorrhoids or anal itching.  Marland Kitchen Hydrocortisone (PREPARATION H EX) Place 1 application rectally 2 (two) times daily as needed (hemmorroids).  Marland Kitchen levothyroxine (SYNTHROID) 75 MCG tablet Take 1 tablet (75 mcg total) by mouth daily before breakfast.  . meclizine (ANTIVERT) 25 MG tablet Take 25 mg by mouth as needed for dizziness.  . [DISCONTINUED] calcium-vitamin D (OSCAL WITH D) 500-200 MG-UNIT tablet Take 2 tablets by mouth 2 (two) times daily. (Patient taking differently: Take 1 tablet by mouth daily. )   No facility-administered encounter medications on file as of 06/01/2019.     ALLERGIES:   No Known Allergies   PHYSICAL EXAM:  ECOG Performance status: 0  Vitals:   06/01/19 1507  BP: 139/82  Pulse: 88  Resp: 18  Temp: 98.9 F (37.2 C)  SpO2: 99%   Filed Weights   06/01/19 1507  Weight: 122 lb 1.6 oz (55.4 kg)    Physical Exam Vitals signs reviewed.  Constitutional:      Appearance: Normal appearance.  Cardiovascular:     Rate and Rhythm: Normal rate and regular rhythm.     Heart sounds: Normal heart sounds.  Pulmonary:     Effort:  Pulmonary effort is normal.     Breath sounds: Normal breath sounds.  Abdominal:     General: There is no distension.     Palpations: Abdomen is soft. There is no mass.  Musculoskeletal:        General: No swelling.  Skin:    General: Skin is warm.  Neurological:     General: No focal deficit present.     Mental Status: She is alert and oriented to person, place, and time.  Psychiatric:        Mood and Affect: Mood normal.        Behavior: Behavior normal.      LABORATORY DATA:  I have reviewed the labs as listed.  CBC    Component Value Date/Time   WBC 6.1 06/01/2019 1438   RBC 4.15 06/01/2019 1438   HGB 12.5 06/01/2019 1438   HCT 38.0 06/01/2019 1438   PLT 253 06/01/2019 1438   MCV 91.6 06/01/2019 1438   MCH 30.1 06/01/2019 1438   MCHC 32.9 06/01/2019 1438   RDW 13.2 06/01/2019 1438   LYMPHSABS 1.5 06/01/2019 1438   MONOABS 0.6 06/01/2019 1438   EOSABS 0.2 06/01/2019 1438   BASOSABS 0.0 06/01/2019 1438   CMP Latest Ref Rng & Units 06/01/2019 03/06/2019 02/24/2019  Glucose 70 - 99 mg/dL 95 102(H) 98  BUN 8 - 23 mg/dL 12 13 18   Creatinine 0.44 - 1.00 mg/dL 0.98 0.95 0.96  Sodium 135 - 145 mmol/L 142 140 140  Potassium 3.5 - 5.1 mmol/L 3.8 3.7 3.6  Chloride 98 - 111 mmol/L 101 103 104  CO2 22 - 32 mmol/L 28 29 28   Calcium 8.9 - 10.3 mg/dL 9.3 9.1 9.3  Total Protein 6.5 - 8.1 g/dL 6.9 6.5 6.7  Total Bilirubin 0.3 - 1.2 mg/dL 0.5 0.7 0.5  Alkaline Phos 38 - 126 U/L 95 77 82  AST 15 - 41 U/L 21 20 18   ALT 0  - 44 U/L 15 16 15        DIAGNOSTIC IMAGING:  I have independently reviewed the scans and discussed with the patient.   I have reviewed Venita Lick LPN's note and agree with the documentation.  I personally performed a face-to-face visit, made revisions and my assessment and plan is as follows.    ASSESSMENT & PLAN:   Primary cancer of anal canal ([place]) 1.  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 -Colonoscopy on 05/24/2018 shows polypoid nonobstructing, non-circumferential, approximately 2 x 2 centimeter small mass found in the distal rectum, biopsy consistent with invasive squamous cell carcinoma, P 16 strongly positive.  - PET CT scan on 06/06/2018 shows 3 cm segment uptake in the anal canal region with no pelvic adenopathy.  There is uptake in the left lobe of the thyroid.  - XRT with Xeloda and mitomycin from 07/05/2018 through 08/17/2018.  -She denies any bleeding per rectum or melena. -She had a flex sigmoidoscopy on 03/06/2019 which showed normal sigmoid colon, descending colon and the rectosigmoid junction. -We will schedule her for a CT CAP in October.  We will see her back after the scan.  We reviewed her blood work which is within normal limits. -She will have surveillance visits with DRE every 3 to 6 months for 5 years along with inguinal node palpation.  Sigmoidoscopy every 6 to 12 months for 3 years is recommended.  2.  Right breast lump: - CT  scan showed incidental right breast lump.  Patient reports that she is aware of this for the last 25 years.  A needle biopsy at that time was benign.  Serial mammograms did not show any growth.  3.  Papillary thyroid cancer: -She had a total thyroidectomy on 01/04/2019 which showed at least 2 foci, both in the left lobe, measuring 0.6 cm 0.1 cm.  There is 0.6 cm lesion is the classic form of papillary thyroid carcinoma.  Incidentally found 0.1 cm nodule is  follicular variant of papillary thyroid carcinoma.  No lymph nodes were examined.  Pathological classification pT1a, P NX.  No lymphovascular invasion.  Margins negative. - She is status post iodine 131 thyroid remnant ablation. -She is currently on Synthroid 75 mcg daily.  She follows up with Dr. Dorris Fetch.        Orders placed this encounter:  Orders Placed This Encounter  Procedures  . CT Abdomen Pelvis W Contrast  . CT Chest W Contrast  . CBC with Differential/Platelet  . Comprehensive metabolic panel      Derek Jack, MD [place] [phone]

## 2019-06-01 NOTE — Assessment & Plan Note (Signed)
1.  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 -Colonoscopy on 05/24/2018 shows polypoid nonobstructing, non-circumferential, approximately 2 x 2 centimeter small mass found in the distal rectum, biopsy consistent with invasive squamous cell carcinoma, P 16 strongly positive.  - PET CT scan on 06/06/2018 shows 3 cm segment uptake in the anal canal region with no pelvic adenopathy.  There is uptake in the left lobe of the thyroid.  - XRT with Xeloda and mitomycin from 07/05/2018 through 08/17/2018.  -She denies any bleeding per rectum or melena. -She had a flex sigmoidoscopy on 03/06/2019 which showed normal sigmoid colon, descending colon and the rectosigmoid junction. -We will schedule her for a CT CAP in October.  We will see her back after the scan.  We reviewed her blood work which is within normal limits. -She will have surveillance visits with DRE every 3 to 6 months for 5 years along with inguinal node palpation.  Sigmoidoscopy every 6 to 12 months for 3 years is recommended.  2.  Right breast lump: - CT scan showed incidental right breast lump.  Patient reports that she is aware of this for the last 25 years.  A needle biopsy at that time was benign.  Serial mammograms did not show any growth.  3.  Papillary thyroid cancer: -She had a total thyroidectomy on 01/04/2019 which showed at least 2 foci, both in the left lobe, measuring 0.6 cm 0.1 cm.  There is 0.6 cm lesion is the classic form of papillary thyroid carcinoma.  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  No lymph nodes were examined.  Pathological classification pT1a, P NX.  No lymphovascular invasion.  Margins negative. - She is status post iodine 131 thyroid remnant ablation. -She is currently on Synthroid 75 mcg daily.  She follows up with Dr. Dorris Fetch.

## 2019-06-01 NOTE — Patient Instructions (Addendum)
[place] at [facility] Discharge Instructions  You were seen today by Dr. Delton Coombes. He went over your recent results. He will schedule you for repeat scans in October. He will see you back after your scans for labs and follow up.   Thank you for choosing [place] at [facility] to provide your oncology and hematology care.  To afford each patient quality time with our provider, please arrive at least 15 minutes before your scheduled appointment time.   If you have a lab appointment with the [facility] please come in thru the  Main Entrance and check in at the main information desk  You need to re-schedule your appointment should you arrive 10 or more minutes late.  We strive to give you quality time with our providers, and arriving late affects you and other patients whose appointments are after yours.  Also, if you no show three or more times for appointments you may be dismissed from the clinic at the providers discretion.     Again, thank you for choosing [facility].  Our hope is that these requests will decrease the amount of time that you wait before being seen by our physicians.       _____________________________________________________________  Should you have questions after your visit to [facility], please contact our office at [phone] between the hours of 8:00 a.m. and 4:30 p.m.  Voicemails left after 4:00 p.m. will not be returned until the following business day.  For prescription refill requests, have your pharmacy contact our office and allow 72 hours.    Cancer Center Support Programs:   > Cancer Support Group  2nd Tuesday of the month 1pm-2pm, Journey Room

## 2019-06-02 ENCOUNTER — Other Ambulatory Visit (HOSPITAL_COMMUNITY): Payer: BLUE CROSS/BLUE SHIELD

## 2019-06-02 ENCOUNTER — Ambulatory Visit (HOSPITAL_COMMUNITY): Payer: BLUE CROSS/BLUE SHIELD | Admitting: Hematology

## 2019-08-21 ENCOUNTER — Other Ambulatory Visit: Payer: Self-pay | Admitting: "Endocrinology

## 2019-09-18 LAB — TSH: TSH: 0.03 — AB (ref 0.41–5.90)

## 2019-09-25 ENCOUNTER — Other Ambulatory Visit: Payer: Self-pay

## 2019-09-25 ENCOUNTER — Encounter: Payer: Self-pay | Admitting: "Endocrinology

## 2019-09-25 ENCOUNTER — Ambulatory Visit (INDEPENDENT_AMBULATORY_CARE_PROVIDER_SITE_OTHER): Payer: BLUE CROSS/BLUE SHIELD | Admitting: "Endocrinology

## 2019-09-25 DIAGNOSIS — E890 Postprocedural hypothyroidism: Secondary | ICD-10-CM | POA: Diagnosis not present

## 2019-09-25 DIAGNOSIS — C73 Malignant neoplasm of thyroid gland: Secondary | ICD-10-CM

## 2019-09-25 MED ORDER — LEVOTHYROXINE SODIUM 50 MCG PO TABS: 50.0000 ug | ORAL_TABLET | Freq: Every day | ORAL | 1 refills | Status: DC

## 2019-09-25 NOTE — Progress Notes (Signed)
09/25/2019, 11:34 AM                                 Endocrinology Telehealth Visit Follow up Note -During COVID -19 Pandemic  I connected with [name] on 09/25/2019   by telephone and verified that I am speaking with the correct person using two identifiers. Otylia Orloff [place], May 14, 1956. she has verbally consented to this visit. All issues noted in this document were discussed and addressed. The format was not optimal for physical exam.   Subjective:    Patient ID: [name], female    DOB: [birth year], PCP Kennieth Rad, MD   Past Medical History:  Diagnosis Date  . Cancer ([place])    Anal Cancer  . History of kidney stones   . Hypertension   . Neuropathy   . Rectal mass 05/18/2018   Past Surgical History:  Procedure Laterality Date  . COLONOSCOPY N/A 05/24/2018   Procedure: COLONOSCOPY;  Surgeon: Rogene Houston, MD;  [facility];  Service: Endoscopy;  Laterality: N/A;  7:30  . FLEXIBLE SIGMOIDOSCOPY N/A 03/02/2019   Procedure: FLEXIBLE SIGMOIDOSCOPY;  Surgeon: Rogene Houston, MD;  Location[facility];  Service: Endoscopy;  Laterality: N/A;  245  . reversal tubal ligation    . SIGMOIDOSCOPY  01/2019  . THYROIDECTOMY N/A 01/04/2019   Procedure: TOTAL THYROIDECTOMY;  Surgeon: Aviva Signs, MD;  Location: [facility];  Service: General;  Laterality: N/A;  . TONSILLECTOMY    . TUBAL LIGATION     Social History   Socioeconomic History  . Marital status: Married    Spouse name: Not on file  . Number of children: Not on file  . Years of education: Not on file  . Highest education level: Not on file  Occupational History  . Not on file  Social Needs  . Financial resource strain: Not on file  . Food insecurity    Worry: Not on file    Inability: Not on file  . Transportation needs    Medical: Not on file    Non-medical: Not on file  Tobacco Use  . Smoking status: Never Smoker  . Smokeless  tobacco: Never Used  Substance and Sexual Activity  . Alcohol use: Never    Frequency: Never  . Drug use: Never  . Sexual activity: Yes    Birth control/protection: Surgical  Lifestyle  . Physical activity    Days per week: Not on file    Minutes per session: Not on file  . Stress: Not on file  Relationships  . Social Herbalist on phone: Not on file    Gets together: Not on file    Attends religious service: Not on file    Active member of club or organization: Not on file    Attends meetings of clubs or organizations: Not on file    Relationship status: Not on file  Other Topics Concern  . Not on file  Social History Narrative  . Not on file   Outpatient Encounter Medications as of 09/25/2019  Medication Sig  . acetaminophen (TYLENOL) 500 MG tablet Take  1 tablet (500 mg total) by mouth every 6 (six) hours as needed for moderate pain or headache.  Marland Kitchen amLODipine (NORVASC) 5 MG tablet Take 2.5 mg by mouth daily.   . benazepril (LOTENSIN) 10 MG tablet Take 10 mg by mouth daily.  . diphenhydrAMINE (BENADRYL) 25 MG tablet Take 25 mg by mouth as needed.  . docusate sodium (COLACE) 100 MG capsule Take 100 mg by mouth at bedtime.  . EUTHYROX 75 MCG tablet TAKE 1 TABLET BY MOUTH ONCE DAILY BEFORE BREAKFAST  . famotidine (PEPCID) 10 MG tablet Take 10 mg by mouth 2 (two) times daily as needed for heartburn or indigestion.  . hydrochlorothiazide (HYDRODIURIL) 12.5 MG tablet Take 12.5 mg by mouth daily.  . hydrocortisone (ANUSOL-HC) 2.5 % rectal cream Place 1 application rectally 2 (two) times daily as needed for hemorrhoids or anal itching.  Marland Kitchen Hydrocortisone (PREPARATION H EX) Place 1 application rectally 2 (two) times daily as needed (hemmorroids).  Marland Kitchen levothyroxine (SYNTHROID) 50 MCG tablet Take 1 tablet (50 mcg total) by mouth daily before breakfast.  . meclizine (ANTIVERT) 25 MG tablet Take 25 mg by mouth as needed for dizziness.   No facility-administered encounter  medications on file as of 09/25/2019.    ALLERGIES: No Known Allergies  VACCINATION STATUS:  There is no immunization history on file for this patient.  HPI LUBERTA REIGNER is 63 y.o. 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  Subsequent biopsy of 0.7 cm nodule in the left lobe of the thyroid in November 2019 revealed papillary thyroid cancer.  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  -Her Thyrogen stimulated whole-body scan after thyroid remnant ablation was negative for distant metastasis.  -Her labs in March 2020 showed detectable thyroglobulin of 23 with undetectable thyroglobulin antibodies. -He denies any new complaints today.  -She is currently on levothyroxine 75 mcg p.o. daily before breakfast.  She reports compliance to her medication.  -She denies any prior exposure to neck radiation, has 1 sister with thyroid malignancy.  -She remains on calcium supplement 500 mg daily, complains of constipation.  Her most recent labs show calcium of 9.3 improving from 8.3 from immediately after her surgery. -She denies palpitations, heat intolerance, tremors.    Objective:    There were no vitals taken for this visit.  Wt Readings from Last 3 Encounters:  06/01/19 122 lb 1.6 oz (55.4 kg)  02/14/19 125 lb (56.7 kg)  02/14/19 125 lb 14.4 oz (57.1 kg)    Physical Exam   CMP     Component Value Date/Time   NA 142 06/01/2019 1438   K 3.8 06/01/2019 1438   CL 101 06/01/2019 1438   CO2 28 06/01/2019 1438   GLUCOSE 95  06/01/2019 1438   BUN 12 06/01/2019 1438   CREATININE 0.98 06/01/2019 1438   CREATININE 0.95 05/18/2018 1416   CALCIUM 9.3 06/01/2019 1438   PROT 6.9 06/01/2019 1438   ALBUMIN 4.3 06/01/2019 1438   AST 21 06/01/2019 1438   ALT 15 06/01/2019 1438   ALKPHOS 95 06/01/2019 1438  BILITOT 0.5 06/01/2019 1438   GFRNONAA >60 06/01/2019 1438   GFRAA >60 06/01/2019 1438    Lab Results  Component Value Date   TSH 0.03 (A) 09/18/2019   TSH 0.052 (L) 06/01/2019   TSH 0.411 03/06/2019   TSH 238.909 (H) 02/24/2019   TSH 0.062 (L) 02/01/2019   TSH 1.923 12/30/2018   FREET4 1.46 03/06/2019   FREET4 1.47 02/24/2019      Assessment & Plan:   1. Postsurgical hypothyroidism 2. 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  She is approached for her lower thyroid hormone exposure using 75 mcg and 50 mcg alternate day dose.  - We discussed about the correct intake of her thyroid hormone, on empty stomach at fasting, with water, separated by at least 30 minutes from breakfast and other medications,  and separated by more than 4 hours from calcium, iron, multivitamins, acid reflux medications (PPIs). -Patient is made aware of the fact that thyroid hormone replacement is needed for life, dose to be adjusted by periodic monitoring of thyroid function tests.   -She is status post I-131 thyroid remnant ablation with negative post therapy whole-body scan for distant metastasis.  -She has detectable thyroglobulin level 23, with undetectable thyroglobulin antibodies.  She will need subsequent thyroid/neck  ultrasound for her next visit in 49-month.     - I advised her  to maintain close follow up with Kennieth Rad, MD for primary care needs.   Time for this visit: 15 minutes. Barbette Reichmann Dinh  participated in the discussions, expressed understanding, and voiced agreement with the above plans.  All questions were  answered to her satisfaction. she is encouraged to contact clinic should she have any questions or concerns prior to her return visit.  Follow up plan: Return in about 4 months (around 01/25/2020) for Follow up with Pre-visit Labs, Thyroid / Neck Ultrasound.   Glade Lloyd, MD [facility] [address] [place], [place] [address] Phone: [phone]  Fax: [phone]     09/25/2019, 11:34 AM  This note was partially dictated with voice recognition software. Similar sounding words can be transcribed inadequately or may not  be corrected upon review.

## 2019-10-12 ENCOUNTER — Inpatient Hospital Stay (HOSPITAL_COMMUNITY): Payer: BC Managed Care – PPO | Attending: Hematology

## 2019-10-12 ENCOUNTER — Other Ambulatory Visit: Payer: Self-pay

## 2019-10-12 ENCOUNTER — Ambulatory Visit (HOSPITAL_COMMUNITY)
Admission: RE | Admit: 2019-10-12 | Discharge: 2019-10-12 | Disposition: A | Discharge status: Home / Self Care | Payer: BC Managed Care – PPO | Source: Ambulatory Visit | Attending: Hematology | Admitting: Hematology

## 2019-10-12 DIAGNOSIS — C73 Malignant neoplasm of thyroid gland: Secondary | ICD-10-CM | POA: Insufficient documentation

## 2019-10-12 DIAGNOSIS — R200 Anesthesia of skin: Secondary | ICD-10-CM | POA: Insufficient documentation

## 2019-10-12 DIAGNOSIS — C211 Malignant neoplasm of anal canal: Secondary | ICD-10-CM

## 2019-10-12 DIAGNOSIS — K5900 Constipation, unspecified: Secondary | ICD-10-CM | POA: Insufficient documentation

## 2019-10-12 LAB — COMPREHENSIVE METABOLIC PANEL
ALT: 16 U/L (ref 0–44)
AST: 22 U/L (ref 15–41)
Albumin: 4.3 g/dL (ref 3.5–5.0)
Alkaline Phosphatase: 90 U/L (ref 38–126)
Anion gap: 10 (ref 5–15)
BUN: 11 mg/dL (ref 8–23)
CO2: 32 mmol/L (ref 22–32)
Calcium: 9.8 mg/dL (ref 8.9–10.3)
Chloride: 99 mmol/L (ref 98–111)
Creatinine, Ser: 1 mg/dL (ref 0.44–1.00)
GFR calc Af Amer: >60 mL/min (ref >60)
GFR calc non Af Amer: 60 mL/min — ABNORMAL LOW (ref >60)
Glucose, Bld: 99 mg/dL (ref 70–99)
Potassium: 4.4 mmol/L (ref 3.5–5.1)
Sodium: 141 mmol/L (ref 135–145)
Total Bilirubin: 0.8 mg/dL (ref 0.3–1.2)
Total Protein: 7.4 g/dL (ref 6.5–8.1)

## 2019-10-12 LAB — CBC WITH DIFFERENTIAL/PLATELET
Abs Immature Granulocytes: 0.01 10*3/uL (ref 0.00–0.07)
Basophils Absolute: 0.1 10*3/uL (ref 0.0–0.1)
Basophils Relative: 1 %
Eosinophils Absolute: 0.1 10*3/uL (ref 0.0–0.5)
Eosinophils Relative: 1 %
HCT: 41.6 % (ref 36.0–46.0)
Hemoglobin: 13.6 g/dL (ref 12.0–15.0)
Immature Granulocytes: 0 %
Lymphocytes Relative: 25 %
Lymphs Abs: 1.5 10*3/uL (ref 0.7–4.0)
MCH: 30.4 pg (ref 26.0–34.0)
MCHC: 32.7 g/dL (ref 30.0–36.0)
MCV: 92.9 fL (ref 80.0–100.0)
Monocytes Absolute: 0.5 10*3/uL (ref 0.1–1.0)
Monocytes Relative: 9 %
Neutro Abs: 3.7 10*3/uL (ref 1.7–7.7)
Neutrophils Relative %: 64 %
Platelets: 279 10*3/uL (ref 150–400)
RBC: 4.48 MIL/uL (ref 3.87–5.11)
RDW: 12.9 % (ref 11.5–15.5)
WBC: 5.9 10*3/uL (ref 4.0–10.5)
nRBC: 0 % (ref 0.0–0.2)

## 2019-10-12 MED ORDER — IOHEXOL 300 MG/ML  SOLN
100.0000 mL | Freq: Once | INTRAMUSCULAR | Status: AC | PRN
  Administered 2019-10-12: 100 mL via INTRAVENOUS

## 2019-10-16 ENCOUNTER — Ambulatory Visit (HOSPITAL_COMMUNITY): Payer: BC Managed Care – PPO | Admitting: Hematology

## 2019-10-17 ENCOUNTER — Other Ambulatory Visit: Payer: Self-pay

## 2019-10-18 ENCOUNTER — Encounter (HOSPITAL_COMMUNITY): Payer: Self-pay | Admitting: Hematology

## 2019-10-18 ENCOUNTER — Inpatient Hospital Stay (HOSPITAL_BASED_OUTPATIENT_CLINIC_OR_DEPARTMENT_OTHER): Payer: BC Managed Care – PPO | Admitting: Hematology

## 2019-10-18 VITALS — BP 149/92 | HR 78 | Temp 97.1°F | Resp 18 | Wt 121.4 lb

## 2019-10-18 DIAGNOSIS — C211 Malignant neoplasm of anal canal: Secondary | ICD-10-CM | POA: Diagnosis not present

## 2019-10-18 DIAGNOSIS — C73 Malignant neoplasm of thyroid gland: Secondary | ICD-10-CM

## 2019-10-18 NOTE — Patient Instructions (Signed)
[facility] Discharge Instructions  You were seen today by Dr. Katragadda. He went over your recent lab results. He will see you back in 6 months for labs and follow up.   Thank you for choosing [facility] to provide your oncology and hematology care.  To afford each patient quality time with our provider, please arrive at least 15 minutes before your scheduled appointment time.   If you have a lab appointment with the Cancer Center please come in thru the  Main Entrance and check in at the main information desk  You need to re-schedule your appointment should you arrive 10 or more minutes late.  We strive to give you quality time with our providers, and arriving late affects you and other patients whose appointments are after yours.  Also, if you no show three or more times for appointments you may be dismissed from the clinic at the providers discretion.     Again, thank you for choosing [facility].  Our hope is that these requests will decrease the amount of time that you wait before being seen by our physicians.       _____________________________________________________________  Should you have questions after your visit to [facility], please contact our office at [phone] between the hours of 8:00 a.m. and 4:30 p.m.  Voicemails left after 4:00 p.m. will not be returned until the following business day.  For prescription refill requests, have your pharmacy contact our office and allow 72 hours.    Cancer Center Support Programs:   > Cancer Support Group  2nd Tuesday of the month 1pm-2pm, Journey Room    

## 2019-10-18 NOTE — Progress Notes (Signed)
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   BRIEF ONCOLOGIC HISTORY:  Oncology History  Primary cancer of anal canal ([place])  06/02/2018 Initial Diagnosis   Primary cancer of anal canal ([place])   06/09/2018 -  Chemotherapy   The patient had ondansetron (ZOFRAN) 8 mg in sodium chloride 0.9 % 50 mL IVPB, , Intravenous,  Once, 1 of 1 cycle Administration:  (07/05/2018),  (08/01/2018) mitoMYcin (MUTAMYCIN) chemo injection 15.5 mg, 10 mg/m2 = 15.5 mg, Intravenous,  Once, 1 of 1 cycle Administration: 15.5 mg (07/05/2018), 15.5 mg (08/01/2018)  for chemotherapy treatment.       CANCER STAGING: Cancer Staging No matching staging information was found for the patient.   INTERVAL HISTORY:  [name] 63 y.o. female seen for follow-up of anal cancer.  Appetite and energy levels are 100%.  Numbness in the legs continues to improve.  She is working full-time job as a Marine scientist.  She reports occasional constipation which is well managed with stool softener.  Denies any nausea, vomiting or diarrhea.  Denies any perianal pain.    REVIEW OF SYSTEMS:  Review of Systems  Gastrointestinal: Positive for constipation.  Neurological: Positive for numbness.  All other systems reviewed and are negative.    PAST MEDICAL/SURGICAL HISTORY:  Past Medical History:  Diagnosis Date  . Cancer ([place])    Anal Cancer  . History of kidney stones   . Hypertension   . Neuropathy   . Rectal mass 05/18/2018   Past Surgical History:  Procedure Laterality Date  . COLONOSCOPY N/A 05/24/2018   Procedure: COLONOSCOPY;  Surgeon: Rogene Houston, MD;  [facility];  Service: Endoscopy;  Laterality: N/A;  7:30  . FLEXIBLE SIGMOIDOSCOPY N/A 03/02/2019   Procedure:  FLEXIBLE SIGMOIDOSCOPY;  Surgeon: Rogene Houston, MD;  [facility];  Service: Endoscopy;  Laterality: N/A;  245  . reversal tubal ligation    . SIGMOIDOSCOPY  01/2019  . THYROIDECTOMY N/A 01/04/2019   Procedure: TOTAL THYROIDECTOMY;  Surgeon: Aviva Signs, MD;  Location: [facility];  Service: General;  Laterality: N/A;  . TONSILLECTOMY    . TUBAL LIGATION       SOCIAL HISTORY:  Social History   Socioeconomic History  . Marital status: Married    Spouse name: Not on file  . Number of children: Not on file  . Years of education: Not on file  . Highest education level: Not on file  Occupational History  . Not on file  Social Needs  . Financial resource strain: Not on file  . Food insecurity    Worry: Not on file    Inability: Not on file  . Transportation needs    Medical: Not on file    Non-medical: Not on file  Tobacco Use  . Smoking status: Never Smoker  . Smokeless tobacco: Never Used  Substance and Sexual Activity  . Alcohol use: Never    Frequency: Never  . Drug use: Never  . Sexual activity: Yes    Birth control/protection: Surgical  Lifestyle  . Physical activity    Days per week: Not on file    Minutes per session: Not on file  . Stress: Not on file  Relationships  . Social connections  Talks on phone: Not on file    Gets together: Not on file    Attends religious service: Not on file    Active member of club or organization: Not on file    Attends meetings of clubs or organizations: Not on file    Relationship status: Not on file  . Intimate partner violence    Fear of current or ex partner: Not on file    Emotionally abused: Not on file    Physically abused: Not on file    Forced sexual activity: Not on file  Other Topics Concern  . Not on file  Social History Narrative  . Not on file    FAMILY HISTORY:  Family History  Problem Relation Age of Onset  . Gastric cancer Paternal Grandfather   . Hypertension Mother   . Kidney cancer  Father   . Stroke Father   . Heart attack Father   . Thyroid cancer Sister   . Hypertension Brother   . Breast cancer Maternal Grandmother   . Colon cancer Neg Hx     CURRENT MEDICATIONS:  Outpatient Encounter Medications as of 10/18/2019  Medication Sig  . amLODipine (NORVASC) 5 MG tablet Take 2.5 mg by mouth daily.   . benazepril (LOTENSIN) 10 MG tablet Take 10 mg by mouth daily.  . EUTHYROX 75 MCG tablet TAKE 1 TABLET BY MOUTH ONCE DAILY BEFORE BREAKFAST  . famotidine (PEPCID) 10 MG tablet Take 10 mg by mouth 2 (two) times daily as needed for heartburn or indigestion.  . hydrochlorothiazide (HYDRODIURIL) 12.5 MG tablet Take 12.5 mg by mouth daily.  Marland Kitchen levothyroxine (SYNTHROID) 50 MCG tablet Take 1 tablet (50 mcg total) by mouth daily before breakfast.  . acetaminophen (TYLENOL) 500 MG tablet Take 1 tablet (500 mg total) by mouth every 6 (six) hours as needed for moderate pain or headache. (Patient not taking: Reported on 10/18/2019)  . diphenhydrAMINE (BENADRYL) 25 MG tablet Take 25 mg by mouth as needed.  . docusate sodium (COLACE) 100 MG capsule Take 100 mg by mouth at bedtime.  . hydrocortisone (ANUSOL-HC) 2.5 % rectal cream Place 1 application rectally 2 (two) times daily as needed for hemorrhoids or anal itching.  Marland Kitchen Hydrocortisone (PREPARATION H EX) Place 1 application rectally 2 (two) times daily as needed (hemmorroids).  . meclizine (ANTIVERT) 25 MG tablet Take 25 mg by mouth as needed for dizziness.   No facility-administered encounter medications on file as of 10/18/2019.     ALLERGIES:  No Known Allergies   PHYSICAL EXAM:  ECOG Performance status: 0  Vitals:   10/18/19 1052  BP: (!) 149/92  Pulse: 78  Resp: 18  Temp: (!) 97.1 F (36.2 C)  SpO2: 100%   Filed Weights   10/18/19 1052  Weight: 121 lb 6.4 oz (55.1 kg)    Physical Exam Vitals signs reviewed.  Constitutional:      Appearance: Normal appearance.  Cardiovascular:     Rate and Rhythm: Normal  rate and regular rhythm.     Heart sounds: Normal heart sounds.  Pulmonary:     Effort: Pulmonary effort is normal.     Breath sounds: Normal breath sounds.  Abdominal:     General: There is no distension.     Palpations: Abdomen is soft. There is no mass.  Musculoskeletal:        General: No swelling.  Skin:    General: Skin is warm.  Neurological:     General: No focal deficit present.  Mental Status: She is alert and oriented to person, place, and time.  Psychiatric:        Mood and Affect: Mood normal.        Behavior: Behavior normal.      LABORATORY DATA:  I have reviewed the labs as listed.  CBC    Component Value Date/Time   WBC 5.9 10/12/2019 0946   RBC 4.48 10/12/2019 0946   HGB 13.6 10/12/2019 0946   HCT 41.6 10/12/2019 0946   PLT 279 10/12/2019 0946   MCV 92.9 10/12/2019 0946   MCH 30.4 10/12/2019 0946   MCHC 32.7 10/12/2019 0946   RDW 12.9 10/12/2019 0946   LYMPHSABS 1.5 10/12/2019 0946   MONOABS 0.5 10/12/2019 0946   EOSABS 0.1 10/12/2019 0946   BASOSABS 0.1 10/12/2019 0946   CMP Latest Ref Rng & Units 10/12/2019 06/01/2019 03/06/2019  Glucose 70 - 99 mg/dL 99 95 102(H)  BUN 8 - 23 mg/dL 11 12 13   Creatinine 0.44 - 1.00 mg/dL 1.00 0.98 0.95  Sodium 135 - 145 mmol/L 141 142 140  Potassium 3.5 - 5.1 mmol/L 4.4 3.8 3.7  Chloride 98 - 111 mmol/L 99 101 103  CO2 22 - 32 mmol/L 32 28 29  Calcium 8.9 - 10.3 mg/dL 9.8 9.3 9.1  Total Protein 6.5 - 8.1 g/dL 7.4 6.9 6.5  Total Bilirubin 0.3 - 1.2 mg/dL 0.8 0.5 0.7  Alkaline Phos 38 - 126 U/L 90 95 77  AST 15 - 41 U/L 22 21 20   ALT 0 - 44 U/L 16 15 16        DIAGNOSTIC IMAGING:  I have independently reviewed the scans and discussed with the patient.   I have reviewed Venita Lick LPN's note and agree with the documentation.  I personally performed a face-to-face visit, made revisions and my assessment and plan is as follows.    ASSESSMENT & PLAN:   Cancer of anal canal ([place])     Primary  cancer of anal canal ([place]) 1.  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 -Colonoscopy on 05/24/2018 shows polypoid nonobstructing, non-circumferential, approximately 2 x 2 centimeter small mass found in the distal rectum, biopsy consistent with invasive squamous cell carcinoma, P 16 strongly positive.  - PET CT scan on 06/06/2018 shows 3 cm segment uptake in the anal canal region with no pelvic adenopathy.  There is uptake in the left lobe of the thyroid.  - XRT with Xeloda and mitomycin from 07/05/2018 through 08/17/2018.  -She denies any bleeding per rectum or melena. -She had a flex sigmoidoscopy on 03/06/2019 which showed normal sigmoid colon, descending colon and the rectosigmoid junction. -We have reviewed her labs which are within normal limits.  We reviewed CT scan of the abdomen and pelvis dated 10/12/2019 which did not show any evidence of recurrent malignancy.  Stable hepatic hypodensities are likely cysts. -I will see her back in 6 months with labs.  Next sigmoidoscopy is in March 2021. -She will have surveillance visits with DRE every 3 to 6 months for 5 years along with inguinal node palpation.  Sigmoidoscopy every 6 to 12 months for 3 years is recommended.  2.  Right breast lump: - CT scan showed incidental right breast lump.  Patient reports that she is aware of this for the last 25 years.  A needle biopsy at that time was benign.  Serial mammograms did not show any growth.  3.  Papillary thyroid cancer: -She had a total thyroidectomy on 01/04/2019 which showed at least 2 foci, both in the left lobe, measuring 0.6 cm 0.1 cm.  There is 0.6 cm lesion is the classic form of papillary thyroid carcinoma.  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  No lymph nodes were examined.  Pathological classification pT1a, P NX.  No lymphovascular invasion.  Margins negative. - She is  status post iodine 131 thyroid remnant ablation. -She is currently on Synthroid.  She follows up with Dr. Dorris Fetch.        Orders placed this encounter:  Orders Placed This Encounter  Procedures  . CBC with Differential/Platelet  . Comprehensive metabolic panel  . TSH  . T4      Derek Jack, MD [place] [phone]

## 2019-10-28 NOTE — Assessment & Plan Note (Addendum)
1.  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 -Colonoscopy on 05/24/2018 shows polypoid nonobstructing, non-circumferential, approximately 2 x 2 centimeter small mass found in the distal rectum, biopsy consistent with invasive squamous cell carcinoma, P 16 strongly positive.  - PET CT scan on 06/06/2018 shows 3 cm segment uptake in the anal canal region with no pelvic adenopathy.  There is uptake in the left lobe of the thyroid.  - XRT with Xeloda and mitomycin from 07/05/2018 through 08/17/2018.  -She denies any bleeding per rectum or melena. -She had a flex sigmoidoscopy on 03/06/2019 which showed normal sigmoid colon, descending colon and the rectosigmoid junction. -We have reviewed her labs which are within normal limits.  We reviewed CT scan of the abdomen and pelvis dated 10/12/2019 which did not show any evidence of recurrent malignancy.  Stable hepatic hypodensities are likely cysts. -I will see her back in 6 months with labs.  Next sigmoidoscopy is in March 2021. -She will have surveillance visits with DRE every 3 to 6 months for 5 years along with inguinal node palpation.  Sigmoidoscopy every 6 to 12 months for 3 years is recommended.  2.  Right breast lump: - CT scan showed incidental right breast lump.  Patient reports that she is aware of this for the last 25 years.  A needle biopsy at that time was benign.  Serial mammograms did not show any growth.  3.  Papillary thyroid cancer: -She had a total thyroidectomy on 01/04/2019 which showed at least 2 foci, both in the left lobe, measuring 0.6 cm 0.1 cm.  There is 0.6 cm lesion is the classic form of papillary thyroid carcinoma.  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  No lymph nodes were examined.  Pathological classification pT1a, P NX.  No lymphovascular invasion.  Margins negative. - She is status post iodine 131 thyroid  remnant ablation. -She is currently on Synthroid.  She follows up with Dr. Dorris Fetch.

## 2019-12-27 ENCOUNTER — Other Ambulatory Visit: Payer: Self-pay

## 2019-12-27 ENCOUNTER — Ambulatory Visit (HOSPITAL_COMMUNITY)
Admission: RE | Admit: 2019-12-27 | Discharge: 2019-12-27 | Disposition: A | Discharge status: Home / Self Care | Payer: BC Managed Care – PPO | Source: Ambulatory Visit | Attending: "Endocrinology | Admitting: "Endocrinology

## 2019-12-27 DIAGNOSIS — C73 Malignant neoplasm of thyroid gland: Secondary | ICD-10-CM | POA: Diagnosis present

## 2020-01-15 ENCOUNTER — Other Ambulatory Visit: Payer: Self-pay | Admitting: "Endocrinology

## 2020-01-25 ENCOUNTER — Ambulatory Visit: Payer: BLUE CROSS/BLUE SHIELD | Admitting: "Endocrinology

## 2020-02-09 ENCOUNTER — Other Ambulatory Visit: Payer: Self-pay

## 2020-02-09 ENCOUNTER — Encounter: Payer: Self-pay | Admitting: "Endocrinology

## 2020-02-09 ENCOUNTER — Ambulatory Visit (INDEPENDENT_AMBULATORY_CARE_PROVIDER_SITE_OTHER): Payer: Self-pay | Admitting: "Endocrinology

## 2020-02-09 DIAGNOSIS — E890 Postprocedural hypothyroidism: Secondary | ICD-10-CM

## 2020-02-09 DIAGNOSIS — C73 Malignant neoplasm of thyroid gland: Secondary | ICD-10-CM

## 2020-02-09 NOTE — Progress Notes (Signed)
02/09/2020, 8:43 AM                                 Endocrinology Telehealth Visit Follow up Note -During COVID -19 Pandemic  I connected with Margaret Castaneda on 02/09/2020   by telephone and verified that I am speaking with the correct person using two identifiers. Margaret Castaneda, 1956/09/27. she has verbally consented to this visit. All issues noted in this document were discussed and addressed. The format was not optimal for physical exam.   Subjective:    Patient ID: Margaret Castaneda, female    DOB: 1956/09/10, PCP Kennieth Rad, MD   Past Medical History:  Diagnosis Date  . Cancer ([place])    Anal Cancer  . History of kidney stones   . Hypertension   . Neuropathy   . Rectal mass 05/18/2018   Past Surgical History:  Procedure Laterality Date  . COLONOSCOPY N/A 05/24/2018   Procedure: COLONOSCOPY;  Surgeon: Rogene Houston, MD;  [facility];  Service: Endoscopy;  Laterality: N/A;  7:30  . FLEXIBLE SIGMOIDOSCOPY N/A 03/02/2019   Procedure: FLEXIBLE SIGMOIDOSCOPY;  Surgeon: Rogene Houston, MD;  Location: [facility];  Service: Endoscopy;  Laterality: N/A;  245  . reversal tubal ligation    . SIGMOIDOSCOPY  01/2019  . THYROIDECTOMY N/A 01/04/2019   Procedure: TOTAL THYROIDECTOMY;  Surgeon: Aviva Signs, MD;  Location: [facility];  Service: General;  Laterality: N/A;  . TONSILLECTOMY    . TUBAL LIGATION     Social History   Socioeconomic History  . Marital status: Married    Spouse name: Not on file  . Number of children: Not on file  . Years of education: Not on file  . Highest education level: Not on file  Occupational History  . Not on file  Tobacco Use  . Smoking status: Never Smoker  . Smokeless tobacco: Never Used  Substance and Sexual Activity  . Alcohol use: Never  . Drug use: Never  . Sexual activity: Yes    Birth control/protection: Surgical  Other Topics Concern  . Not on file  Social  History Narrative  . Not on file   Social Determinants of Health   Financial Resource Strain:   . Difficulty of Paying Living Expenses: Not on file  Food Insecurity:   . Worried About Charity fundraiser in the Last Year: Not on file  . Ran Out of Food in the Last Year: Not on file  Transportation Needs:   . Lack of Transportation (Medical): Not on file  . Lack of Transportation (Non-Medical): Not on file  Physical Activity:   . Days of Exercise per Week: Not on file  . Minutes of Exercise per Session: Not on file  Stress:   . Feeling of Stress : Not on file  Social Connections:   . Frequency of Communication with Friends and Family: Not on file  . Frequency of Social Gatherings with Friends and Family: Not on file  . Attends Religious Services: Not on file  . Active Member of Clubs or Organizations: Not on file  . Attends Club or  Organization Meetings: Not on file  . Marital Status: Not on file   Outpatient Encounter Medications as of 02/09/2020  Medication Sig  . acetaminophen (TYLENOL) 500 MG tablet Take 1 tablet (500 mg total) by mouth every 6 (six) hours as needed for moderate pain or headache. (Patient not taking: Reported on 10/18/2019)  . amLODipine (NORVASC) 5 MG tablet Take 2.5 mg by mouth daily.   . benazepril (LOTENSIN) 10 MG tablet Take 10 mg by mouth daily.  . diphenhydrAMINE (BENADRYL) 25 MG tablet Take 25 mg by mouth as needed.  . docusate sodium (COLACE) 100 MG capsule Take 100 mg by mouth at bedtime.  . EUTHYROX 50 MCG tablet TAKE 1 TABLET BY MOUTH DAILY BEFORE BREAKFAST  . EUTHYROX 75 MCG tablet TAKE 1 TABLET BY MOUTH ONCE DAILY BEFORE BREAKFAST  . famotidine (PEPCID) 10 MG tablet Take 10 mg by mouth 2 (two) times daily as needed for heartburn or indigestion.  . hydrochlorothiazide (HYDRODIURIL) 12.5 MG tablet Take 12.5 mg by mouth daily.  . hydrocortisone (ANUSOL-HC) 2.5 % rectal cream Place 1 application rectally 2 (two) times daily as needed for hemorrhoids  or anal itching.  Marland Kitchen Hydrocortisone (PREPARATION H EX) Place 1 application rectally 2 (two) times daily as needed (hemmorroids).  . meclizine (ANTIVERT) 25 MG tablet Take 25 mg by mouth as needed for dizziness.   No facility-administered encounter medications on file as of 02/09/2020.   ALLERGIES: No Known Allergies  VACCINATION STATUS:  There is no immunization history on file for this patient.  HPI Margaret Castaneda is 64 y.o. 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  -She has no new complaints today. 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  Subsequent biopsy of 0.7 cm nodule in the left lobe of the thyroid in November 2019 revealed papillary thyroid cancer.  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  -Her Thyrogen stimulated whole-body scan after thyroid remnant ablation was negative for distant metastasis.    Previsit thyroid/neck ultrasound was also negative for residual/recurrent thyroid tissue no cervical lymphadenopathy on December 27, 2019.  -Her labs in March 2020 showed detectable thyroglobulin of 23 with undetectable thyroglobulin antibodies. -Her previsit thyroid function tests are favorable with TSH 0.9, free T4 1.17. -She is currently on levothyroxine 75 mcg and 50 mcg on alternate days.    She reports compliance to her medication.  -She denies any prior exposure to neck radiation, has 1 sister with thyroid malignancy.  -She remains on calcium supplement 500 mg daily, complains of constipation.  Her most recent labs show calcium of 9.8 improving from 8.3  from immediately after her surgery. -She denies palpitations, heat intolerance, tremors.    Objective:    There were no vitals taken for this visit.  Wt Readings from Last 3 Encounters:  10/18/19 121 lb 6.4 oz (55.1 kg)  06/01/19 122 lb 1.6 oz (55.4 kg)  02/14/19 125 lb (56.7 kg)    Physical Exam   CMP     Component Value Date/Time   NA 141 10/12/2019 0946   K 4.4 10/12/2019 0946   CL 99 10/12/2019 0946  CO2 32 10/12/2019 0946   GLUCOSE 99 10/12/2019 0946   BUN 11 10/12/2019 0946   CREATININE 1.00 10/12/2019 0946   CREATININE 0.95 05/18/2018 1416   CALCIUM 9.8 10/12/2019 0946   PROT 7.4 10/12/2019 0946   ALBUMIN 4.3 10/12/2019 0946   AST 22 10/12/2019 0946   ALT 16 10/12/2019 0946   ALKPHOS 90 10/12/2019 0946   BILITOT 0.8 10/12/2019 0946   GFRNONAA 60 (L) 10/12/2019 0946   GFRAA >60 10/12/2019 0946    Lab Results  Component Value Date   TSH 0.03 (A) 09/18/2019   TSH 0.052 (L) 06/01/2019   TSH 0.411 03/06/2019   TSH 238.909 (H) 02/24/2019   TSH 0.062 (L) 02/01/2019   TSH 1.923 12/30/2018   FREET4 1.46 03/06/2019   FREET4 1.47 02/24/2019     Thyroid/neck ultrasound December 27, 2019 No regional cervical lymphadenopathy.  IMPRESSION: Post total thyroidectomy without evidence of residual or locally recurrent disease.   Assessment & Plan:   1. Postsurgical hypothyroidism 2. 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  She is advised to continue levothyroxine 75 mcg and 50 mcg alternate day dose.  - We discussed about the correct intake of her thyroid hormone, on empty stomach at fasting, with water, separated by at least 30 minutes from breakfast and other medications,  and separated by more than 4 hours from calcium, iron, multivitamins, acid reflux medications (PPIs). -Patient is made aware of the fact that thyroid hormone replacement is needed for life, dose  to be adjusted by periodic monitoring of thyroid function tests.  -She is status post I-131 thyroid remnant ablation with negative post therapy whole-body scan for distant metastasis.  -She has detectable thyroglobulin level 23, with undetectable thyroglobulin antibodies.   Previsit thyroid/neck ultrasound was also negative for residual/recurrent thyroid tissue no cervical lymphadenopathy on December 27, 2019.  She will be examined in 6 months and decide on her next imaging study for next year.   - I advised her  to maintain close follow up with Kennieth Rad, MD for primary care needs.     - Time spent on this patient care encounter:  25 minutes of which 50% was spent in  counseling and the rest reviewing  her current and  previous labs / studies and medications  doses and developing a plan for long term care. Barbette Reichmann Martinson  participated in the discussions, expressed understanding, and voiced agreement with the above plans.  All questions were answered to her satisfaction. she is encouraged to contact clinic should she have any questions or concerns prior to her return visit.   Follow up plan: Return in about 6 months (around 08/08/2020) for Follow up with Pre-visit Labs.   Glade Lloyd, MD [facility] [address] [place], [place] [address] Phone: [phone]  Fax: [phone]     02/09/2020, 8:43 AM  This note was partially dictated with voice recognition software. Similar sounding words can be transcribed inadequately or may not  be corrected upon review.

## 2020-02-11 IMAGING — CT CT CHEST W/ CM
3 of 5 series · 13 of 36 positions shown, 15 images · IV contrast (Isovue)
Comparison: None.

CLINICAL DATA: Rectal cancer staging.

EXAM:
CT CHEST, ABDOMEN, AND PELVIS WITH CONTRAST
TECHNIQUE: Multidetector CT imaging of the chest, abdomen and pelvis was
performed following the standard protocol during bolus
administration of intravenous contrast.
CONTRAST:  100mL GXM3P2-BWW IOPAMIDOL (GXM3P2-BWW) INJECTION 61%

[Series 2: cap with · axial · 0.61mm/px · z∈[-345,+105]mm · 7 of 122 slices shown, 9 images]
[im 16/122  mediastinal]
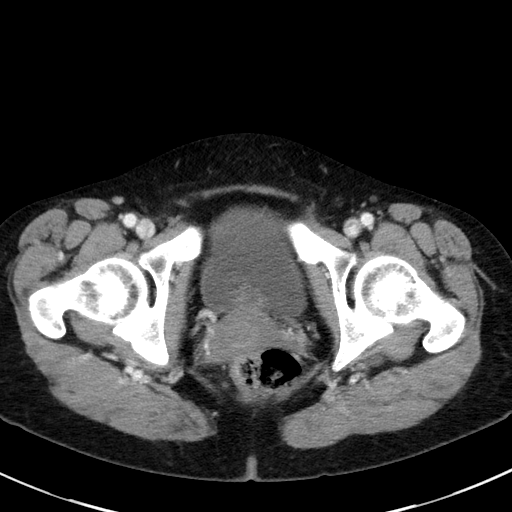
[im 16/122  lung]
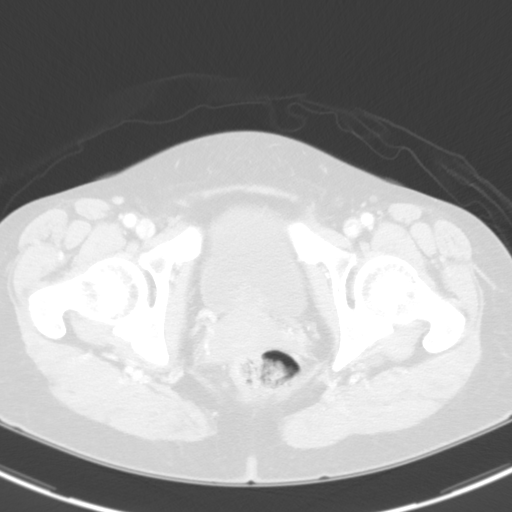
[im 31/122  lung]
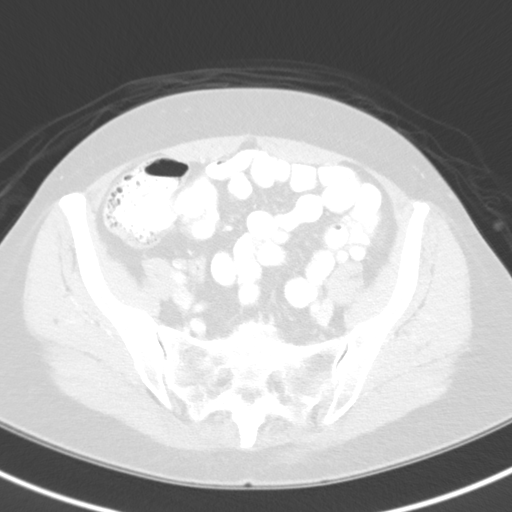
[im 46/122  lung]
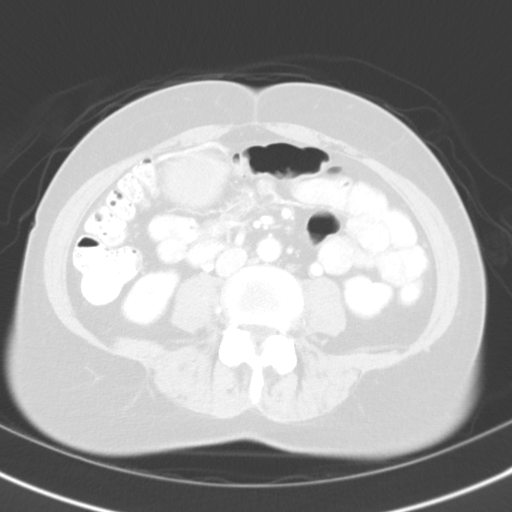
[im 61/122  lung]
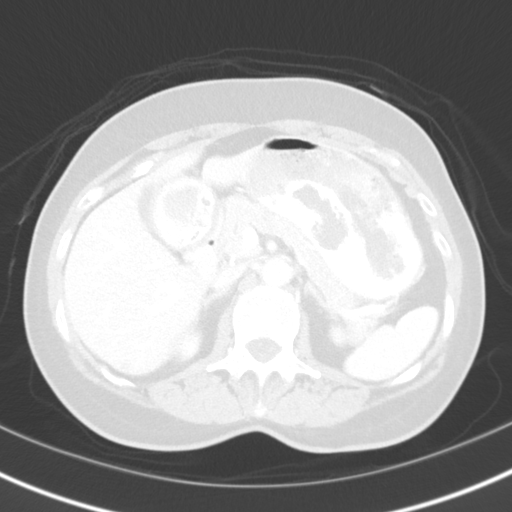
[im 76/122  mediastinal]
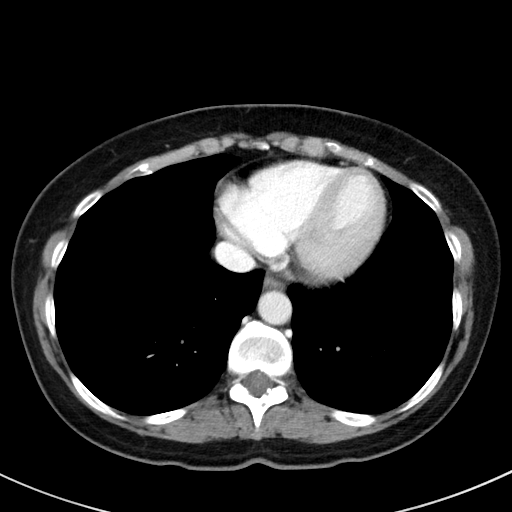
[im 76/122  lung]
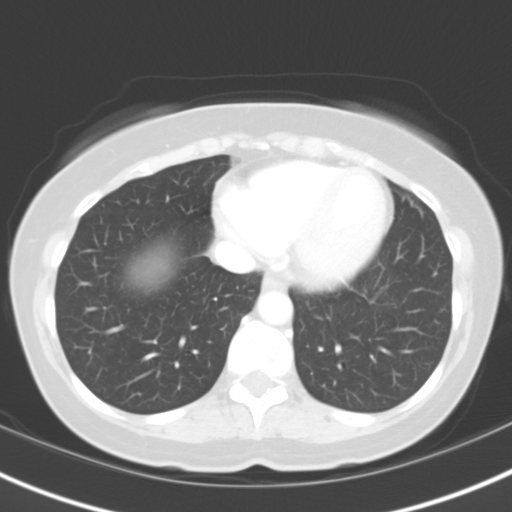
[im 91/122  lung]
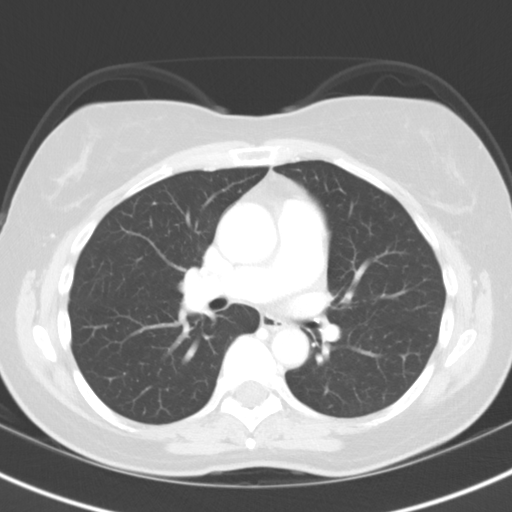
[im 106/122  lung]
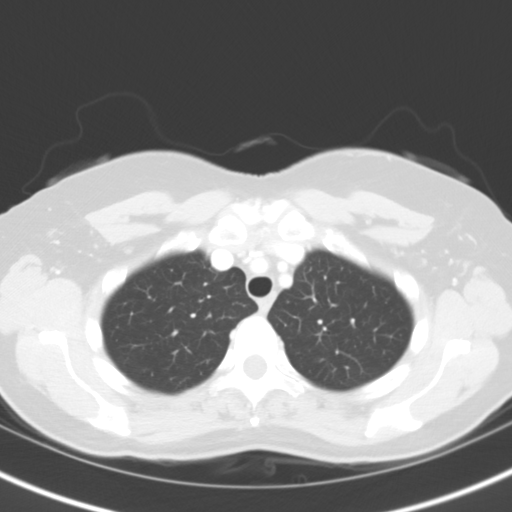

[Series 3: delay · axial · delayed · 0.61mm/px · z∈[-340,-190]mm · 3 of 77 slices shown]
[im 16/77  lung]
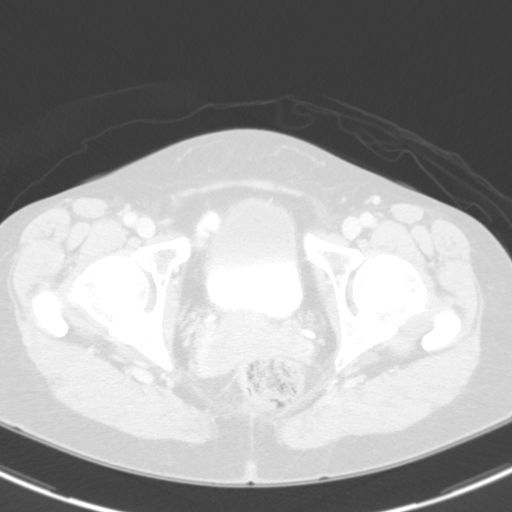
[im 31/77  lung]
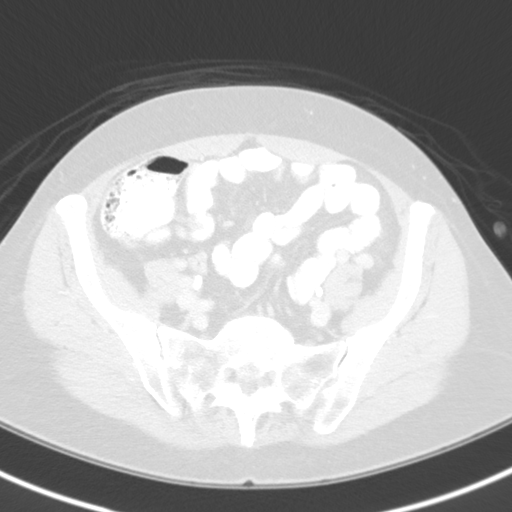
[im 46/77  lung]
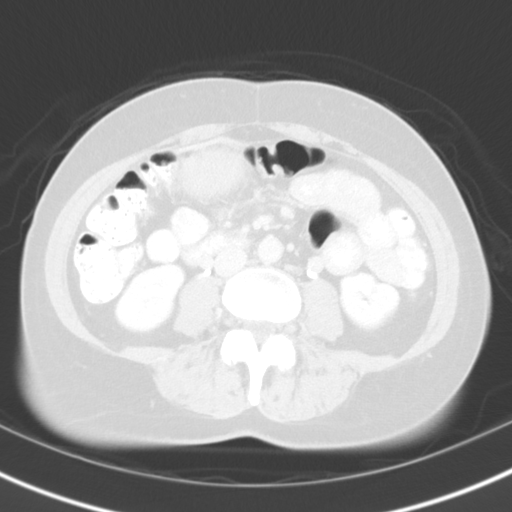

[Series 5: coronals · coronal · 0.69mm/px · 3 of 137 slices shown]
[im 28/137  lung]
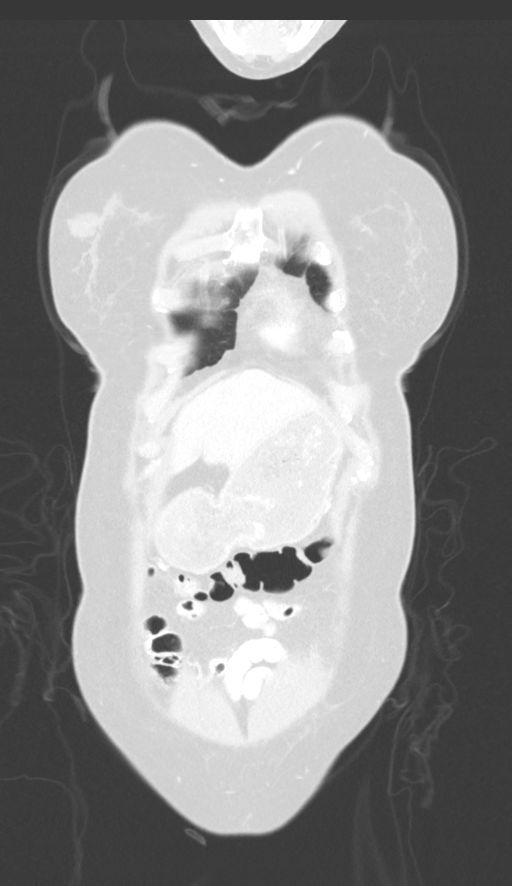
[im 55/137  lung]
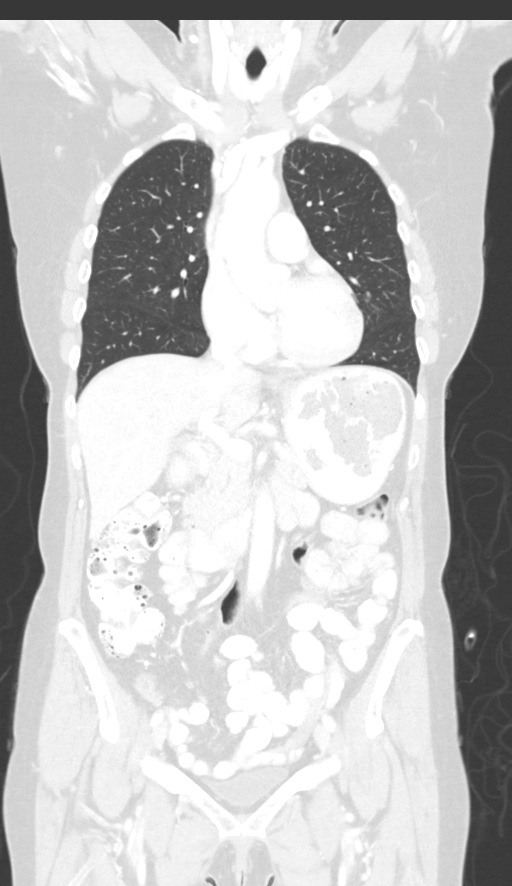
[im 82/137  lung]
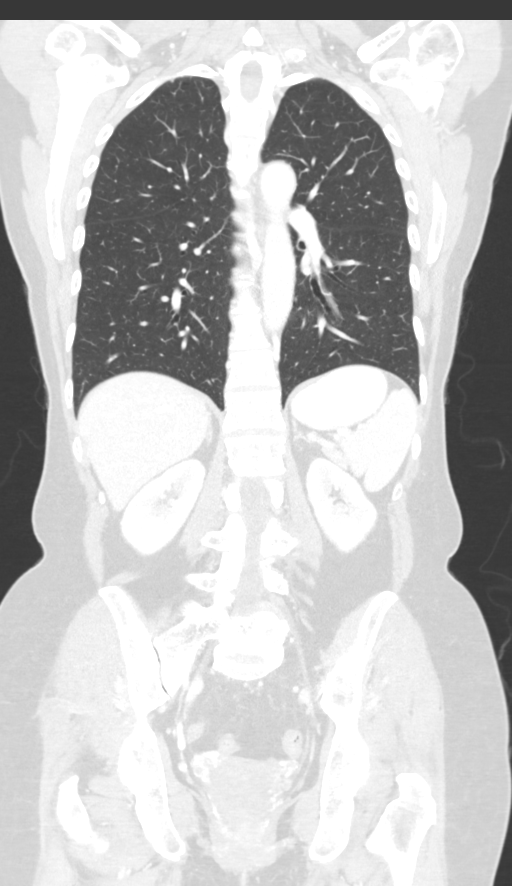

[13 of 36 positions shown; findings below may reference images not displayed]

FINDINGS: CT CHEST FINDINGS

Cardiovascular: The heart size is normal.  No pericardial effusion.

Mediastinum/Nodes: No mediastinal lymphadenopathy. There is no hilar
lymphadenopathy. The esophagus has normal imaging features. There is
no axillary lymphadenopathy.

Lungs/Pleura: The central tracheobronchial airways are patent. No
suspicious pulmonary nodule or mass. No focal airspace
consolidation. No pulmonary edema or pleural effusion.

Musculoskeletal: 1.9 x 2.1 cm soft tissue lesion identified in the
upper outer quadrant of the right breast. Bone windows reveal no
worrisome lytic or sclerotic osseous lesions.

CT ABDOMEN PELVIS FINDINGS

Hepatobiliary: Scattered tiny well-defined hypoattenuating lesions
in the liver parenchyma range in size from several mm up to a
dominant 10 mm lesion in the posterior right liver (image 61/series
2). Larger lesions suggest cysts and tiny lesions are too small to
characterize but likely benign. There is no evidence for gallstones,
gallbladder wall thickening, or pericholecystic fluid. No
intrahepatic or extrahepatic biliary dilation.

Pancreas: No focal mass lesion. No dilatation of the main duct. No
intraparenchymal cyst. No peripancreatic edema.

Spleen: No splenomegaly. No focal mass lesion.

Adrenals/Urinary Tract: No adrenal nodule or mass. Kidneys are
normal in appearance. No evidence for hydroureter. The urinary
bladder appears normal for the degree of distention.

Stomach/Bowel: Stomach is distended with food and contrast material.
Duodenum is normally positioned as is the ligament of Treitz. No
small bowel wall thickening. No small bowel dilatation. The terminal
ileum is normal. The appendix is normal. Descending colon is
positioned more medially than typically seen. Subtle non
circumferential wall thickening is seen anteriorly into the left of
the distal rectum.

Vascular/Lymphatic: No abdominal aortic aneurysm. No abdominal
aortic atherosclerotic calcification. Portal vein, superior
mesenteric vein, and splenic vein are patent although the splenic
vein appears narrowed just proximal to the portal splenic
confluence. There is no gastrohepatic or hepatoduodenal ligament
lymphadenopathy. No intraperitoneal or retroperitoneal
lymphadenopathy. No lymphadenopathy along the common or internal
iliac chains by size criteria. No pelvic sidewall lymphadenopathy.
Possible very tiny right perirectal lymph node seen adjacent to the
rectum (image 103/2).

Reproductive: The uterus has normal CT imaging appearance. There is
no adnexal mass.

Other: No intraperitoneal free fluid.

Musculoskeletal: Bone windows reveal no worrisome lytic or sclerotic
osseous lesions.
IMPRESSION: 1. No definite findings of metastatic disease in the chest, abdomen,
or pelvis. Possible very tiny lymph node identified just to the
right of the rectum. Subtle wall thickening in the distal rectum may
reflect the lesion seen at colonoscopy..
2. 1.9 x 2.1 cm soft tissue lesion in the upper outer quadrant of
the right breast. Correlation with mammographic screening history
recommended.

## 2020-02-13 ENCOUNTER — Encounter (INDEPENDENT_AMBULATORY_CARE_PROVIDER_SITE_OTHER): Payer: Self-pay | Admitting: *Deleted

## 2020-02-13 ENCOUNTER — Encounter (INDEPENDENT_AMBULATORY_CARE_PROVIDER_SITE_OTHER): Payer: Self-pay | Admitting: Internal Medicine

## 2020-02-13 ENCOUNTER — Other Ambulatory Visit (INDEPENDENT_AMBULATORY_CARE_PROVIDER_SITE_OTHER): Payer: Self-pay | Admitting: *Deleted

## 2020-02-13 ENCOUNTER — Ambulatory Visit (INDEPENDENT_AMBULATORY_CARE_PROVIDER_SITE_OTHER): Payer: BC Managed Care – PPO | Admitting: Internal Medicine

## 2020-02-13 ENCOUNTER — Other Ambulatory Visit: Payer: Self-pay

## 2020-02-13 VITALS — Wt 119.0 lb

## 2020-02-13 DIAGNOSIS — K921 Melena: Secondary | ICD-10-CM | POA: Diagnosis not present

## 2020-02-13 DIAGNOSIS — C211 Malignant neoplasm of anal canal: Secondary | ICD-10-CM | POA: Diagnosis not present

## 2020-02-13 NOTE — Progress Notes (Signed)
Virtual Visit via Telephone Note  Patient had yearly face to visit scheduled today but visit was changed to virtual/telephone visit because patient does not have a power and she is staying at [facility]. I connected with [name] on 02/13/20 at 10:30 AM EST by telephone and verified that I am speaking with the correct person using two identifiers.  Location: [facility]: [facility] Provider: office   I discussed the limitations, risks, security and privacy concerns of performing an evaluation and management service by telephone and the availability of in person appointments. I also discussed with the patient that there may be a patient responsible charge related to this service. The patient expressed understanding and agreed to proceed.   History of Present Illness:  Patient is 64 year old Caucasian female who was discovered to have rectal mass by her gynecologist Dr. Caren Griffins Heist back in May 2019.  Colonoscopy revealed distal rectal mass extending into anal canal.  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  She was treated with Xeloda mitomycin and radiation therapy from July 09/16/2018 through 08/17/2018.  She had flexible sigmoidoscopy in last year and she was noted to be in remission. Dr. Delton Coombes had recommended yearly surveillance sigmoidoscopy for 3 years and thereafter less often. Patient has no complaints.  She says she used to see small amount of blood when she was constipated and had to strain.  She tried magnesium but it caused diarrhea.  She has been watching her diet.  She states that since she has been eating 1 prune every day she has a normal bowel movement.  She denies abdominal pain anorexia or weight loss.  She says she is still working full-time.  When she is not working she is looking after 4 of her grandchildren ages 74 months to 51 years. She takes Benadryl occasionally to help her sleep.  Current Outpatient Medications  Medication Sig  Dispense Refill  . acetaminophen (TYLENOL) 500 MG tablet Take 1 tablet (500 mg total) by mouth every 6 (six) hours as needed for moderate pain or headache. 30 tablet 0  . amLODipine (NORVASC) 5 MG tablet Take 2.5 mg by mouth daily.   4  . benazepril (LOTENSIN) 10 MG tablet Take 10 mg by mouth daily.    . Cholecalciferol (VITAMIN D3 PO) Take 1,000 Units by mouth daily.    . diphenhydrAMINE (BENADRYL) 25 MG tablet Take 25 mg by mouth as needed.    . EUTHYROX 50 MCG tablet TAKE 1 TABLET BY MOUTH DAILY BEFORE BREAKFAST 30 tablet 2  . EUTHYROX 75 MCG tablet TAKE 1 TABLET BY MOUTH ONCE DAILY BEFORE BREAKFAST 30 tablet 2  . famotidine (PEPCID) 10 MG tablet Take 10 mg by mouth 2 (two) times daily as needed for heartburn or indigestion.    . hydrochlorothiazide (HYDRODIURIL) 12.5 MG tablet Take 12.5 mg by mouth daily.    . Hydrocortisone (PREPARATION H EX) Place 1 application rectally 2 (two) times daily as needed (hemmorroids).    . meclizine (ANTIVERT) 25 MG tablet Take 25 mg by mouth as needed for dizziness.     No current facility-administered medications for this visit.      Observations/Objective:  Patient reported her weight to be 119 pounds. She weighed 125 pounds on 02/14/2019.  Assessment and Plan:  History of squamous cell carcinoma of the anal canal diagnosed in May 2019 and treated with chemoradiation therapy.  Last flexible sigmoidoscopy was in March 2020 and no abnormality was noted. She is having  no symptoms concerning for relapse.  She has been able to prevent constipation by dietary measures and eating 1 prune every day. She will undergo surveillance flexible sigmoidoscopy next month.   Follow Up Instructions:  Surveillance flexible sigmoidoscopy with conscious sedation to be scheduled next month. Office visit on as-needed basis. Patient's medication list was updated. I discussed the assessment and treatment plan with the patient. The patient was provided an opportunity to  ask questions and all were answered. The patient agreed with the plan and demonstrated an understanding of the instructions.   The patient was advised to call back or seek an in-person evaluation if the symptoms worsen or if the condition fails to improve as anticipated.  I provided 9 minutes of non-face-to-face time during this encounter.   Hildred Laser, MD

## 2020-03-25 ENCOUNTER — Other Ambulatory Visit (HOSPITAL_COMMUNITY)
Admission: RE | Admit: 2020-03-25 | Discharge: 2020-03-25 | Disposition: A | Discharge status: Home / Self Care | Payer: BC Managed Care – PPO | Source: Ambulatory Visit | Attending: Internal Medicine | Admitting: Internal Medicine

## 2020-03-25 ENCOUNTER — Other Ambulatory Visit: Payer: Self-pay

## 2020-03-25 DIAGNOSIS — Z01812 Encounter for preprocedural laboratory examination: Secondary | ICD-10-CM | POA: Insufficient documentation

## 2020-03-25 DIAGNOSIS — Z20822 Contact with and (suspected) exposure to covid-19: Secondary | ICD-10-CM | POA: Diagnosis not present

## 2020-03-25 LAB — SARS CORONAVIRUS 2 (TAT 6-24 HRS): SARS Coronavirus 2: NEGATIVE

## 2020-03-27 ENCOUNTER — Ambulatory Visit (HOSPITAL_COMMUNITY)
Admission: RE | Admit: 2020-03-27 | Discharge: 2020-03-27 | Disposition: A | Discharge status: Home / Self Care | Payer: BC Managed Care – PPO | Attending: Internal Medicine | Admitting: Internal Medicine

## 2020-03-27 ENCOUNTER — Other Ambulatory Visit: Payer: Self-pay

## 2020-03-27 ENCOUNTER — Encounter (HOSPITAL_COMMUNITY): Payer: Self-pay | Admitting: Internal Medicine

## 2020-03-27 ENCOUNTER — Encounter (HOSPITAL_COMMUNITY)
Admission: RE | Disposition: A | Discharge status: Home / Self Care | Payer: Self-pay | Source: Home / Self Care | Attending: Internal Medicine

## 2020-03-27 DIAGNOSIS — Z85048 Personal history of other malignant neoplasm of rectum, rectosigmoid junction, and anus: Secondary | ICD-10-CM | POA: Insufficient documentation

## 2020-03-27 DIAGNOSIS — Z79899 Other long term (current) drug therapy: Secondary | ICD-10-CM | POA: Insufficient documentation

## 2020-03-27 DIAGNOSIS — K644 Residual hemorrhoidal skin tags: Secondary | ICD-10-CM

## 2020-03-27 DIAGNOSIS — K5520 Angiodysplasia of colon without hemorrhage: Secondary | ICD-10-CM

## 2020-03-27 DIAGNOSIS — I10 Essential (primary) hypertension: Secondary | ICD-10-CM | POA: Insufficient documentation

## 2020-03-27 DIAGNOSIS — Z1211 Encounter for screening for malignant neoplasm of colon: Secondary | ICD-10-CM | POA: Diagnosis present

## 2020-03-27 DIAGNOSIS — Z87442 Personal history of urinary calculi: Secondary | ICD-10-CM | POA: Diagnosis not present

## 2020-03-27 DIAGNOSIS — C211 Malignant neoplasm of anal canal: Secondary | ICD-10-CM

## 2020-03-27 DIAGNOSIS — Z08 Encounter for follow-up examination after completed treatment for malignant neoplasm: Secondary | ICD-10-CM

## 2020-03-27 DIAGNOSIS — K6289 Other specified diseases of anus and rectum: Secondary | ICD-10-CM

## 2020-03-27 HISTORY — PX: FLEXIBLE SIGMOIDOSCOPY: SHX5431

## 2020-03-27 SURGERY — SIGMOIDOSCOPY, FLEXIBLE
Anesthesia: Moderate Sedation

## 2020-03-27 MED ORDER — MIDAZOLAM HCL 5 MG/5ML IJ SOLN
INTRAMUSCULAR | Status: DC | PRN
  Administered 2020-03-27: 2 mg via INTRAVENOUS
  Administered 2020-03-27: 1 mg via INTRAVENOUS

## 2020-03-27 MED ORDER — MEPERIDINE HCL 25 MG/ML IJ SOLN
INTRAMUSCULAR | Status: DC | PRN
  Administered 2020-03-27 (×2): 25 mg via INTRAVENOUS

## 2020-03-27 MED ORDER — MEPERIDINE HCL 50 MG/ML IJ SOLN
INTRAMUSCULAR | Status: AC
  Filled 2020-03-27: qty 1

## 2020-03-27 MED ORDER — MIDAZOLAM HCL 5 MG/5ML IJ SOLN
INTRAMUSCULAR | Status: AC
  Filled 2020-03-27: qty 5

## 2020-03-27 MED ORDER — SODIUM CHLORIDE 0.9 % IV SOLN
INTRAVENOUS | Status: DC
  Administered 2020-03-27: 1000 mL via INTRAVENOUS

## 2020-03-27 NOTE — H&P (Signed)
Margaret Castaneda is an 64 y.o. female.   Chief Complaint: Patient is here for flexible sigmoidoscopy. HPI: Patient 64 year old Caucasian female who was diagnosed squamous cell carcinoma within canal in May 2019.  She was treated with chemoradiation and has remained in remission.  Last sigmoidoscopy was 1 year ago.  She denies abdominal pain or change in bowel habits.  She states she does not pass any blood as long as her stool is formed and soft. Family history is negative for CRC.  Past Medical History:  Diagnosis Date  . Cancer ([place])    Anal Cancer  . History of kidney stones   . Hypertension   . Neuropathy   . Rectal mass 05/18/2018    Past Surgical History:  Procedure Laterality Date  . COLONOSCOPY N/A 05/24/2018   Procedure: COLONOSCOPY;  Surgeon: Rogene Houston, MD;  [facility];  Service: Endoscopy;  Laterality: N/A;  7:30  . FLEXIBLE SIGMOIDOSCOPY N/A 03/02/2019   Procedure: FLEXIBLE SIGMOIDOSCOPY;  Surgeon: Rogene Houston, MD;  [facility];  Service: Endoscopy;  Laterality: N/A;  245  . reversal tubal ligation    . SIGMOIDOSCOPY  01/2019  . THYROIDECTOMY N/A 01/04/2019   Procedure: TOTAL THYROIDECTOMY;  Surgeon: Aviva Signs, MD;  Location: [facility];  Service: General;  Laterality: N/A;  . TONSILLECTOMY    . TUBAL LIGATION      Family History  Problem Relation Age of Onset  . Gastric cancer Paternal Grandfather   . Hypertension Mother   . Kidney cancer Father   . Stroke Father   . Heart attack Father   . Thyroid cancer Sister   . Hypertension Brother   . Breast cancer Maternal Grandmother   . Colon cancer Neg Hx    Social History:  reports that she has never smoked. She has never used smokeless tobacco. She reports that she does not drink alcohol or use drugs.  Allergies: No Known Allergies  Medications Prior to Admission  Medication Sig Dispense Refill  . acetaminophen (TYLENOL) 500 MG tablet Take 1 tablet (500 mg total) by mouth every 6  (six) hours as needed for moderate pain or headache. 30 tablet 0  . amLODipine (NORVASC) 5 MG tablet Take 2.5 mg by mouth daily.   4  . benazepril (LOTENSIN) 10 MG tablet Take 10 mg by mouth daily.    . diphenhydrAMINE (BENADRYL) 25 MG tablet Take 25 mg by mouth every 6 (six) hours as needed for allergies.     Arna Medici 50 MCG tablet TAKE 1 TABLET BY MOUTH DAILY BEFORE BREAKFAST (Patient taking differently: Take 50 mcg by mouth every Monday, Wednesday, and Friday. ) 30 tablet 2  . EUTHYROX 75 MCG tablet TAKE 1 TABLET BY MOUTH ONCE DAILY BEFORE BREAKFAST (Patient taking differently: Take 75 mcg by mouth every Tuesday, Thursday, Saturday, and Sunday at 6 PM. In the morning.) 30 tablet 2  . famotidine (PEPCID) 10 MG tablet Take 10 mg by mouth daily with lunch.     . hydrochlorothiazide (HYDRODIURIL) 12.5 MG tablet Take 12.5 mg by mouth daily.    Marland Kitchen ibuprofen (ADVIL) 200 MG tablet Take 400 mg by mouth every 8 (eight) hours as needed (pain).    Marland Kitchen docusate sodium (COLACE) 100 MG capsule Take 100 mg by mouth 2 (two) times daily as needed for mild constipation.    . meclizine (ANTIVERT) 25 MG tablet Take 25 mg by mouth 3 (three) times daily as needed for dizziness.  No results found for this or any previous visit (from the past 48 hour(s)). No results found.  Review of Systems  Blood pressure (!) 157/77, pulse 74, temperature 98.7 F (37.1 C), temperature source Oral, resp. rate 14, height 5\' 1"  (1.549 m), weight 54.4 kg, SpO2 100 %. Physical Exam  Constitutional: She appears well-developed and well-nourished.  HENT:  Mouth/Throat: Oropharynx is clear and moist.  Eyes: Conjunctivae are normal. No scleral icterus.  Neck: No thyromegaly present.  Cardiovascular: Normal rate, regular rhythm and normal heart sounds.  No murmur heard. Respiratory: Effort normal.  GI:  Abdomen is symmetrical with Pfannenstiel scar.  On palpation is soft and nontender with organomegaly or masses.   Musculoskeletal:        General: No edema.  Lymphadenopathy:    She has no cervical adenopathy.  Neurological: She is alert.  Skin: Skin is warm and dry.     Assessment/Plan History of squamous cell carcinoma of the anal canal. Surveillance flexible sigmoidoscopy.  Hildred Laser, MD 03/27/2020, 10:59 AM

## 2020-03-27 NOTE — Op Note (Signed)
[facility] Patient Name: Margaret Castaneda Procedure Date: 03/27/2020 10:24 AM MRN: [id number] Date of Birth: May 23, 1956 Attending MD: Hildred Laser , MD CSN: [id number] Age: 64 Admit Type: Outpatient Procedure:                Flexible Sigmoidoscopy Indications:              High risk colon cancer surveillance: Personal                            history of anal cancer Providers:                Hildred Laser, MD, Hinton Rao, RN, Raphael Gibney, Technician Referring MD:             Kennieth Rad, MD and Derek Jack, MD Medicines:                Meperidine 50 mg IV, Midazolam 3 mg IV Complications:            No immediate complications. Estimated Blood Loss:     Estimated blood loss: none. Estimated blood loss                            was minimal( due to telangiectasia) Procedure:                Pre-Anesthesia Assessment:                           - Prior to the procedure, a History and Physical                            was performed, and patient medications and                            allergies were reviewed. The patient's tolerance of                            previous anesthesia was also reviewed. The risks                            and benefits of the procedure and the sedation                            options and risks were discussed with the patient.                            All questions were answered, and informed consent                            was obtained. Prior Anticoagulants: The patient has                            taken no previous anticoagulant or antiplatelet  agents except for NSAID medication. ASA Grade                            Assessment: II - A patient with mild systemic                            disease. After reviewing the risks and benefits,                            the patient was deemed in satisfactory condition to                            undergo the procedure.                  After obtaining informed consent, the scope was                            passed under direct vision. The [id number]-[id number]                            [id number] scope was introduced through the anus and                            advanced to the the descending colon. The flexible                            sigmoidoscopy was accomplished without difficulty.                            The patient tolerated the procedure well. The                            quality of the bowel preparation was excellent. Scope In: 11:06:37 AM Scope Out: 11:10:52 AM Total Procedure Duration: 0 hours 4 minutes 15 seconds  Findings:      Skin tags were found on perianal exam.      The digital rectal exam was normal.      The proximal and mid rectum, recto-sigmoid colon, sigmoid colon and       distal descending colon appeared normal.      A few small angiodysplastic lesions without bleeding were found in the       distal rectum.      Anal papilla(e) were hypertrophied. Impression:               - Perianal skin tags found on perianal exam.                           - The proximal rectum, recto-sigmoid colon, sigmoid                            colon and distal descending colon are normal.                           - A few non-bleeding distal rectal angiodysplastic  lesions.                           - Anal papilla(e) were hypertrophied.                           - No specimens collected. Moderate Sedation:      Moderate (conscious) sedation was administered by the endoscopy nurse       and supervised by the endoscopist. The following parameters were       monitored: oxygen saturation, heart rate, blood pressure, CO2       capnography and response to care. Total physician intraservice time was       7 minutes. Recommendation:           - Discharge patient to home (with spouse).                           - Continue present medications.                           - Repeat  flexible sigmoidoscopy in 1 to 3 year for                            surveillance( as recommended by Dr. Delton Coombes. Procedure Code(s):        --- Professional ---                           [phone], Sigmoidoscopy, flexible; diagnostic,                            including collection of specimen(s) by brushing or                            washing, when performed (separate procedure) Diagnosis Code(s):        --- Professional ---                           [id number], Personal history of other malignant                            neoplasm of rectum, rectosigmoid junction, and anus                           K55.20, Angiodysplasia of colon without hemorrhage                           K62.89, Other specified diseases of anus and rectum                           K64.4, Residual hemorrhoidal skin tags CPT copyright 2019 American Medical Association. All rights reserved. The codes documented in this report are preliminary and upon coder review may  be revised to meet current compliance requirements. Hildred Laser, MD Hildred Laser, MD 03/27/2020 11:23:07 AM This report has been signed electronically. Number of Addenda: 0

## 2020-03-27 NOTE — Discharge Instructions (Signed)
Resume usual medications and diet as before. No driving for 24 hours. Next exam either in 1 or 3 years as recommended by Dr. Delton Coombes.       Flexible Sigmoidoscopy, Care After This sheet gives you information about how to care for yourself after your procedure. Your health care provider may also give you more specific instructions. If you have problems or questions, contact your health care provider. What can I expect after the procedure? After the procedure, it is common to have:  Abdominal cramping or pain.  Bloating.  A small amount of rectal bleeding if you had a biopsy. Follow these instructions at home:  Take over-the-counter and prescription medicines only as told by your health care provider.  Do not drive for 24 hours if you received a medicine to help you relax (sedative).  Keep all follow-up visits as told by your health care provider. This is important. Contact a health care provider if:  You have abdominal pain or cramping that gets worse or is not helped with medicine.  You continue to have small amounts of rectal bleeding after 24 hours.  You have nausea or vomiting.  You feel weak or dizzy.  You have a fever. Get help right away if:  You pass large blood clots or see a large amount of blood in the toilet after having a bowel movement.  You have nausea or vomiting for more than 24 hours after the procedure. This information is not intended to replace advice given to you by your health care provider. Make sure you discuss any questions you have with your health care provider. Document Revised: 08/06/2016 Document Reviewed: 03/14/2016 Elsevier Patient Education  [place].

## 2020-04-17 ENCOUNTER — Ambulatory Visit (HOSPITAL_COMMUNITY): Payer: BC Managed Care – PPO | Admitting: Hematology

## 2020-04-17 ENCOUNTER — Inpatient Hospital Stay (HOSPITAL_COMMUNITY): Payer: BC Managed Care – PPO

## 2020-04-17 ENCOUNTER — Other Ambulatory Visit (HOSPITAL_COMMUNITY): Payer: BC Managed Care – PPO

## 2020-05-15 ENCOUNTER — Inpatient Hospital Stay (HOSPITAL_COMMUNITY): Payer: BC Managed Care – PPO | Attending: Hematology

## 2020-05-15 ENCOUNTER — Other Ambulatory Visit: Payer: Self-pay

## 2020-05-15 ENCOUNTER — Inpatient Hospital Stay (HOSPITAL_COMMUNITY): Payer: BC Managed Care – PPO | Admitting: Hematology

## 2020-05-15 VITALS — BP 131/53 | HR 82 | Temp 97.3°F | Resp 18 | Wt 120.8 lb

## 2020-05-15 DIAGNOSIS — Z79899 Other long term (current) drug therapy: Secondary | ICD-10-CM | POA: Insufficient documentation

## 2020-05-15 DIAGNOSIS — C73 Malignant neoplasm of thyroid gland: Secondary | ICD-10-CM | POA: Insufficient documentation

## 2020-05-15 DIAGNOSIS — H8102 Meniere's disease, left ear: Secondary | ICD-10-CM | POA: Insufficient documentation

## 2020-05-15 DIAGNOSIS — C211 Malignant neoplasm of anal canal: Secondary | ICD-10-CM | POA: Diagnosis present

## 2020-05-15 LAB — CBC WITH DIFFERENTIAL/PLATELET
Abs Immature Granulocytes: 0.01 10*3/uL (ref 0.00–0.07)
Basophils Absolute: 0.1 10*3/uL (ref 0.0–0.1)
Basophils Relative: 1 %
Eosinophils Absolute: 0.1 10*3/uL (ref 0.0–0.5)
Eosinophils Relative: 2 %
HCT: 36.9 % (ref 36.0–46.0)
Hemoglobin: 12 g/dL (ref 12.0–15.0)
Immature Granulocytes: 0 %
Lymphocytes Relative: 27 %
Lymphs Abs: 1.5 10*3/uL (ref 0.7–4.0)
MCH: 30.2 pg (ref 26.0–34.0)
MCHC: 32.5 g/dL (ref 30.0–36.0)
MCV: 92.9 fL (ref 80.0–100.0)
Monocytes Absolute: 0.5 10*3/uL (ref 0.1–1.0)
Monocytes Relative: 9 %
Neutro Abs: 3.4 10*3/uL (ref 1.7–7.7)
Neutrophils Relative %: 61 %
Platelets: 237 10*3/uL (ref 150–400)
RBC: 3.97 MIL/uL (ref 3.87–5.11)
RDW: 13 % (ref 11.5–15.5)
WBC: 5.6 10*3/uL (ref 4.0–10.5)
nRBC: 0 % (ref 0.0–0.2)

## 2020-05-15 LAB — COMPREHENSIVE METABOLIC PANEL
ALT: 14 U/L (ref 0–44)
AST: 20 U/L (ref 15–41)
Albumin: 4 g/dL (ref 3.5–5.0)
Alkaline Phosphatase: 74 U/L (ref 38–126)
Anion gap: 9 (ref 5–15)
BUN: 15 mg/dL (ref 8–23)
CO2: 31 mmol/L (ref 22–32)
Calcium: 9 mg/dL (ref 8.9–10.3)
Chloride: 101 mmol/L (ref 98–111)
Creatinine, Ser: 0.96 mg/dL (ref 0.44–1.00)
GFR calc Af Amer: >60 mL/min (ref >60)
GFR calc non Af Amer: >60 mL/min (ref >60)
Glucose, Bld: 92 mg/dL (ref 70–99)
Potassium: 3.3 mmol/L — ABNORMAL LOW (ref 3.5–5.1)
Sodium: 141 mmol/L (ref 135–145)
Total Bilirubin: 0.6 mg/dL (ref 0.3–1.2)
Total Protein: 6.6 g/dL (ref 6.5–8.1)

## 2020-05-15 LAB — TSH: TSH: 0.834 u[IU]/mL (ref 0.350–4.500)

## 2020-05-15 NOTE — Patient Instructions (Signed)
[place] at [facility] Discharge Instructions  You were seen today by Dr. Delton Coombes. He went over your recent results. Your labs today look good. He would recommend another sigmoidoscopy on 02/2021. He will see you back in 6 months for labs and follow up.   Thank you for choosing [place] at [facility] to provide your oncology and hematology care.  To afford each patient quality time with our provider, please arrive at least 15 minutes before your scheduled appointment time.   If you have a lab appointment with the [facility] please come in thru the  Main Entrance and check in at the main information desk  You need to re-schedule your appointment should you arrive 10 or more minutes late.  We strive to give you quality time with our providers, and arriving late affects you and other patients whose appointments are after yours.  Also, if you no show three or more times for appointments you may be dismissed from the clinic at the providers discretion.     Again, thank you for choosing [facility].  Our hope is that these requests will decrease the amount of time that you wait before being seen by our physicians.       _____________________________________________________________  Should you have questions after your visit to [facility], please contact our office at [phone] between the hours of 8:00 a.m. and 4:30 p.m.  Voicemails left after 4:00 p.m. will not be returned until the following business day.  For prescription refill requests, have your pharmacy contact our office and allow 72 hours.    Cancer Center Support Programs:   > Cancer Support Group  2nd Tuesday of the month 1pm-2pm, Journey Room

## 2020-05-15 NOTE — Progress Notes (Signed)
[place], [place] [id number]   CLINIC:  Medical Oncology/Hematology  PCP:  Kennieth Rad, MD [facility] [address] 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   CURRENT THERAPY: Active surveillance.  BRIEF ONCOLOGIC HISTORY:  Oncology History  Primary cancer of anal canal ([place])  06/02/2018 Initial Diagnosis   Primary cancer of anal canal ([place])   06/09/2018 -  Chemotherapy   The patient had ondansetron (ZOFRAN) 8 mg in sodium chloride 0.9 % 50 mL IVPB, , Intravenous,  Once, 1 of 1 cycle Administration:  (07/05/2018),  (08/01/2018) mitoMYcin (MUTAMYCIN) chemo injection 15.5 mg, 10 mg/m2 = 15.5 mg, Intravenous,  Once, 1 of 1 cycle Administration: 15.5 mg (07/05/2018), 15.5 mg (08/01/2018)  for chemotherapy treatment.      CANCER STAGING: Cancer Staging No matching staging information was found for the patient.  INTERVAL HISTORY:  Ms. Wingate 64 y.o. female returns for routine follow-up of her squamous cell carcinoma of the anal canal.. Zanasia was last seen on 10/18/2019.  Denies any bleeding per rectum or melena.  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  She completed her flexible sigmoidoscopy on 03/27/2020 with Dr. Laural Golden  Overall, she tells me she has been feeling pretty well. She continues with her synthroid as prescribed.    REVIEW OF SYSTEMS:  Review of Systems  Constitutional: Negative for appetite change, chills, diaphoresis, fatigue and fever.  HENT:   Positive for hearing loss. Negative for mouth sores, sore throat and trouble swallowing.   Eyes: Negative for eye problems.  Respiratory: Negative for cough, shortness of breath and wheezing.   Cardiovascular: Negative for chest pain, leg swelling and palpitations.  Gastrointestinal: Negative for abdominal pain, constipation, diarrhea, nausea and vomiting.  Genitourinary:  Negative for bladder incontinence, dysuria and frequency.   Musculoskeletal: Negative for arthralgias, back pain and myalgias.  Skin: Negative for rash.  Neurological: Negative for dizziness, extremity weakness, headaches and numbness.  Hematological: Does not bruise/bleed easily.  Psychiatric/Behavioral: Negative for depression and sleep disturbance. The patient is not nervous/anxious.     PAST MEDICAL/SURGICAL HISTORY:  Past Medical History:  Diagnosis Date  . Cancer ([place])    Anal Cancer  . History of kidney stones   . Hypertension   . Neuropathy   . Rectal mass 05/18/2018   Past Surgical History:  Procedure Laterality Date  . COLONOSCOPY N/A 05/24/2018   Procedure: COLONOSCOPY;  Surgeon: Rogene Houston, MD;  [facility];  Service: Endoscopy;  Laterality: N/A;  7:30  . FLEXIBLE SIGMOIDOSCOPY N/A 03/02/2019   Procedure: FLEXIBLE SIGMOIDOSCOPY;  Surgeon: Rogene Houston, MD;  Location: [facility];  Service: Endoscopy;  Laterality: N/A;  245  . FLEXIBLE SIGMOIDOSCOPY N/A 03/27/2020   Procedure: FLEXIBLE SIGMOIDOSCOPY;  Surgeon: Rogene Houston, MD;  Location: [facility];  Service: Endoscopy;  Laterality: N/A;  1145  . reversal tubal ligation    . SIGMOIDOSCOPY  01/2019  . THYROIDECTOMY N/A 01/04/2019   Procedure: TOTAL THYROIDECTOMY;  Surgeon: Aviva Signs, MD;  Location: [facility] ORS;  Service: General;  Laterality: N/A;  . TONSILLECTOMY    . TUBAL LIGATION      SOCIAL HISTORY:  Social History   Socioeconomic History  . Marital status: Married    Spouse name: Not on file  . Number of children: Not on file  . Years of education:  Not on file  . Highest education level: Not on file  Occupational History  . Not on file  Tobacco Use  . Smoking status: Never Smoker  . Smokeless tobacco: Never Used  Substance and Sexual Activity  . Alcohol use: Never  . Drug use: Never  . Sexual activity: Yes    Birth control/protection: Surgical  Other Topics Concern  . Not  on file  Social History Narrative  . Not on file   Social Determinants of Health   Financial Resource Strain:   . Difficulty of Paying Living Expenses:   Food Insecurity:   . Worried About Charity fundraiser in the Last Year:   . Arboriculturist in the Last Year:   Transportation Needs:   . Film/video editor (Medical):   Marland Kitchen Lack of Transportation (Non-Medical):   Physical Activity:   . Days of Exercise per Week:   . Minutes of Exercise per Session:   Stress:   . Feeling of Stress :   Social Connections:   . Frequency of Communication with Friends and Family:   . Frequency of Social Gatherings with Friends and Family:   . Attends Religious Services:   . Active Member of Clubs or Organizations:   . Attends Archivist Meetings:   Marland Kitchen Marital Status:   Intimate Partner Violence:   . Fear of Current or Ex-Partner:   . Emotionally Abused:   Marland Kitchen Physically Abused:   . Sexually Abused:     FAMILY HISTORY:  Family History  Problem Relation Age of Onset  . Gastric cancer Paternal Grandfather   . Hypertension Mother   . Kidney cancer Father   . Stroke Father   . Heart attack Father   . Thyroid cancer Sister   . Hypertension Brother   . Breast cancer Maternal Grandmother   . Colon cancer Neg Hx     CURRENT MEDICATIONS:  Current Outpatient Medications  Medication Sig Dispense Refill  . amLODipine (NORVASC) 5 MG tablet Take 2.5 mg by mouth daily.   4  . benazepril (LOTENSIN) 10 MG tablet Take 10 mg by mouth daily.    Marland Kitchen docusate sodium (COLACE) 100 MG capsule Take 100 mg by mouth 2 (two) times daily as needed for mild constipation.    Arna Medici 50 MCG tablet TAKE 1 TABLET BY MOUTH DAILY BEFORE BREAKFAST (Patient taking differently: Take 50 mcg by mouth every Monday, Wednesday, and Friday. ) 30 tablet 2  . EUTHYROX 75 MCG tablet TAKE 1 TABLET BY MOUTH ONCE DAILY BEFORE BREAKFAST (Patient taking differently: Take 75 mcg by mouth every Tuesday, Thursday, Saturday, and  Sunday at 6 PM. In the morning.) 30 tablet 2  . famotidine (PEPCID) 10 MG tablet Take 10 mg by mouth daily with lunch.     Marland Kitchen acetaminophen (TYLENOL) 500 MG tablet Take 1 tablet (500 mg total) by mouth every 6 (six) hours as needed for moderate pain or headache. (Patient not taking: Reported on 05/15/2020) 30 tablet 0  . diphenhydrAMINE (BENADRYL) 25 MG tablet Take 25 mg by mouth every 6 (six) hours as needed for allergies.     . hydrochlorothiazide (HYDRODIURIL) 12.5 MG tablet Take 12.5 mg by mouth daily.    Marland Kitchen ibuprofen (ADVIL) 200 MG tablet Take 400 mg by mouth every 8 (eight) hours as needed (pain).    . meclizine (ANTIVERT) 25 MG tablet Take 25 mg by mouth 3 (three) times daily as needed for dizziness.     No  current facility-administered medications for this visit.    ALLERGIES:  No Known Allergies  PHYSICAL EXAM:  Performance status (ECOG): 0 - Asymptomatic  Vitals:   05/15/20 1442  BP: (!) 131/53  Pulse: 82  Resp: 18  Temp: (!) 97.3 F (36.3 C)  SpO2: 100%   Wt Readings from Last 3 Encounters:  05/15/20 120 lb 12.8 oz (54.8 kg)  03/27/20 120 lb (54.4 kg)  02/13/20 119 lb (54 kg)   Physical Exam Constitutional:      General: She is not in acute distress.    Appearance: Normal appearance. She is normal weight. She is not ill-appearing.  HENT:     Mouth/Throat:     Mouth: Mucous membranes are moist.     Pharynx: No oropharyngeal exudate or posterior oropharyngeal erythema.  Eyes:     Extraocular Movements: Extraocular movements intact.     Pupils: Pupils are equal, round, and reactive to light.  Cardiovascular:     Rate and Rhythm: Normal rate and regular rhythm.     Pulses: Normal pulses.     Heart sounds: Normal heart sounds. No murmur. No friction rub. No gallop.   Pulmonary:     Effort: Pulmonary effort is normal.     Breath sounds: Normal breath sounds. No wheezing, rhonchi or rales.  Abdominal:     Palpations: There is no mass.     Tenderness: There is no  abdominal tenderness. There is no guarding.  Musculoskeletal:        General: No swelling or tenderness.     Right lower leg: No edema.     Left lower leg: No edema.  Lymphadenopathy:     Lower Body: No right inguinal adenopathy. No left inguinal adenopathy.  Skin:    Findings: No bruising or erythema.  Neurological:     Mental Status: She is alert and oriented to person, place, and time.     Sensory: No sensory deficit.  Psychiatric:        Mood and Affect: Mood normal.        Behavior: Behavior normal.        Thought Content: Thought content normal.        Judgment: Judgment normal.     LABORATORY DATA:  I have reviewed the labs as listed.  CBC Latest Ref Rng & Units 05/15/2020 10/12/2019 06/01/2019  WBC 4.0 - 10.5 K/uL 5.6 5.9 6.1  Hemoglobin 12.0 - 15.0 g/dL 12.0 13.6 12.5  Hematocrit 36.0 - 46.0 % 36.9 41.6 38.0  Platelets 150 - 400 K/uL 237 279 253   CMP Latest Ref Rng & Units 05/15/2020 10/12/2019 06/01/2019  Glucose 70 - 99 mg/dL 92 99 95  BUN 8 - 23 mg/dL 15 11 12   Creatinine 0.44 - 1.00 mg/dL 0.96 1.00 0.98  Sodium 135 - 145 mmol/L 141 141 142  Potassium 3.5 - 5.1 mmol/L 3.3(L) 4.4 3.8  Chloride 98 - 111 mmol/L 101 99 101  CO2 22 - 32 mmol/L 31 32 28  Calcium 8.9 - 10.3 mg/dL 9.0 9.8 9.3  Total Protein 6.5 - 8.1 g/dL 6.6 7.4 6.9  Total Bilirubin 0.3 - 1.2 mg/dL 0.6 0.8 0.5  Alkaline Phos 38 - 126 U/L 74 90 95  AST 15 - 41 U/L 20 22 21   ALT 0 - 44 U/L 14 16 15     DIAGNOSTIC IMAGING:  I have independently reviewed the scans and discussed with the patient.  ASSESSMENT & PLAN:  Primary cancer of anal canal ([place]) 1.  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 -Flex sigmoidoscopy on 03/06/2019 showed normal sigmoid colon, descending colon and rectosigmoid junction. -CTAP on 10/12/2019 did not show any evidence of recurrent malignancy.  Stable hepatic hypodensities likely cysts. -I reviewed  flexible sigmoidoscopy on 03/27/2020 showing proximal rectum, rectosigmoid colon and sigmoid colon distal descending colon within normal limits.  A few nonbleeding distal rectal angiodysplastic lesions.  Anal papillae were hypertrophic.  Physical exam today did not reveal any palpable inguinal lymph nodes. -I have reviewed her CBC and comprehensive metabolic panel which are within normal limits.  Potassium is mildly low at 3.3. -We will see her back in 6 months for follow-up.  She will have surveillance visits with DRE every 3 to 6 months for 5 years with inguinal lymph node palpation.  Sigmoidoscopy every 6 to 12 months for 3 years.  2.  Right breast lump: -CT scan showed incidental right breast lump.  Patient reports she is aware of this lump for 25 years.  A needle biopsy at that time was benign.  Serial mammograms did not show any growth.  3.  Papillary thyroid cancer: -Total thyroidectomy on 01/04/2019 which showed at least 2 foci, both in the left lobe measuring 0.6 and 0.1 cm.  0.6 cm lesion is the classic form of papillary thyroid carcinoma.  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  No lymph nodes were examined.  PT1AP NX.  No lymphovascular invasion.  Margins negative. -She is status post iodine-131 thyroid remnant ablation. -She is currently on Synthroid and follows up with Dr. Dorris Fetch.    Orders placed this encounter:  Orders Placed This Encounter  Procedures  . CBC with Differential/Platelet  . Comprehensive metabolic panel      Derek Jack, MD, 05/15/20 5:03 PM  [place] [phone]   I, Jacqualyn Posey, am acting as a scribe for Dr. Sanda Linger.  I, Derek Jack MD, have reviewed the above documentation for accuracy and completeness, and I agree with the above.

## 2020-05-15 NOTE — Assessment & Plan Note (Signed)
1.  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 -Flex sigmoidoscopy on 03/06/2019 showed normal sigmoid colon, descending colon and rectosigmoid junction. -CTAP on 10/12/2019 did not show any evidence of recurrent malignancy.  Stable hepatic hypodensities likely cysts. -I reviewed flexible sigmoidoscopy on 03/27/2020 showing proximal rectum, rectosigmoid colon and sigmoid colon distal descending colon within normal limits.  A few nonbleeding distal rectal angiodysplastic lesions.  Anal papillae were hypertrophic.  Physical exam today did not reveal any palpable inguinal lymph nodes. -I have reviewed her CBC and comprehensive metabolic panel which are within normal limits.  Potassium is mildly low at 3.3. -We will see her back in 6 months for follow-up.  She will have surveillance visits with [facility] every 3 to 6 months for 5 years with inguinal lymph node palpation.  Sigmoidoscopy every 6 to 12 months for 3 years.  2.  Right breast lump: -CT scan showed incidental right breast lump.  Patient reports she is aware of this lump for 25 years.  A needle biopsy at that time was benign.  Serial mammograms did not show any growth.  3.  Papillary thyroid cancer: -Total thyroidectomy on 01/04/2019 which showed at least 2 foci, both in the left lobe measuring 0.6 and 0.1 cm.  0.6 cm lesion is the classic form of papillary thyroid carcinoma.  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  No lymph nodes were examined.  PT1AP NX.  No lymphovascular invasion.  Margins negative. -She is status post iodine-131 thyroid remnant ablation. -She is currently on Synthroid and follows up with Dr. Dorris Fetch.

## 2020-05-16 LAB — T4: T4, Total: 8.8 ug/dL (ref 4.5–12.0)

## 2020-06-22 ENCOUNTER — Other Ambulatory Visit: Payer: Self-pay | Admitting: "Endocrinology

## 2020-08-07 LAB — TSH: TSH: 1.62 (ref 0.41–5.90)

## 2020-08-09 ENCOUNTER — Ambulatory Visit: Payer: BLUE CROSS/BLUE SHIELD | Admitting: "Endocrinology

## 2020-08-12 ENCOUNTER — Other Ambulatory Visit: Payer: Self-pay

## 2020-08-12 ENCOUNTER — Telehealth (INDEPENDENT_AMBULATORY_CARE_PROVIDER_SITE_OTHER): Payer: BC Managed Care – PPO | Admitting: "Endocrinology

## 2020-08-12 ENCOUNTER — Encounter: Payer: Self-pay | Admitting: "Endocrinology

## 2020-08-12 DIAGNOSIS — E890 Postprocedural hypothyroidism: Secondary | ICD-10-CM

## 2020-08-12 DIAGNOSIS — C73 Malignant neoplasm of thyroid gland: Secondary | ICD-10-CM | POA: Diagnosis not present

## 2020-08-12 NOTE — Progress Notes (Signed)
08/12/2020, 6:02 PM                                     Endocrinology Telehealth Visit Follow up Note -During COVID -19 Pandemic  I connected with Margaret Castaneda on 08/12/2020   by telephone and verified that I am speaking with the correct person using two identifiers. Margaret Castaneda, 1956/12/15. she has verbally consented to this visit. All issues noted in this document were discussed and addressed. The format was not optimal for physical exam.    Subjective:    Patient ID: Margaret Castaneda, female    DOB: 1956/06/22, PCP Margaret Rad, MD   Past Medical History:  Diagnosis Date  . Cancer ([place])    Anal Cancer  . History of kidney stones   . Hypertension   . Neuropathy   . Rectal mass 05/18/2018   Past Surgical History:  Procedure Laterality Date  . COLONOSCOPY N/A 05/24/2018   Procedure: COLONOSCOPY;  Surgeon: Margaret Houston, MD;  [facility];  Service: Endoscopy;  Laterality: N/A;  7:30  . FLEXIBLE SIGMOIDOSCOPY N/A 03/02/2019   Procedure: FLEXIBLE SIGMOIDOSCOPY;  Surgeon: Margaret Houston, MD;  [facility];  Service: Endoscopy;  Laterality: N/A;  245  . FLEXIBLE SIGMOIDOSCOPY N/A 03/27/2020   Procedure: FLEXIBLE SIGMOIDOSCOPY;  Surgeon: Margaret Houston, MD;  [facility];  Service: Endoscopy;  Laterality: N/A;  1145  . reversal tubal ligation    . SIGMOIDOSCOPY  01/2019  . THYROIDECTOMY N/A 01/04/2019   Procedure: TOTAL THYROIDECTOMY;  Surgeon: Margaret Signs, MD;  Location: [facility];  Service: General;  Laterality: N/A;  . TONSILLECTOMY    . TUBAL LIGATION     Social History   Socioeconomic History  . Marital status: Married    Spouse name: Not on file  . Number of children: Not on file  . Years of education: Not on file  . Highest education level: Not on file  Occupational History  . Not on file  Tobacco Use  . Smoking status: Never Smoker  . Smokeless tobacco: Never Used   Vaping Use  . Vaping Use: Never used  Substance and Sexual Activity  . Alcohol use: Never  . Drug use: Never  . Sexual activity: Yes    Birth control/protection: Surgical  Other Topics Concern  . Not on file  Social History Narrative  . Not on file   Social Determinants of Health   Financial Resource Strain:   . Difficulty of Paying Living Expenses:   Food Insecurity:   . Worried About Charity fundraiser in the Last Year:   . Arboriculturist in the Last Year:   Transportation Needs:   . Film/video editor (Medical):   Marland Kitchen Lack of Transportation (Non-Medical):   Physical Activity:   . Days of Exercise per Week:   . Minutes of Exercise per Session:   Stress:   . Feeling of Stress :   Social Connections:   . Frequency of Communication with Friends and Family:   . Frequency of Social Gatherings with Friends and Family:   .  Attends Religious Services:   . Active Member of Clubs or Organizations:   . Attends Archivist Meetings:   Marland Kitchen Marital Status:    Outpatient Encounter Medications as of 08/12/2020  Medication Sig  . acetaminophen (TYLENOL) 500 MG tablet Take 1 tablet (500 mg total) by mouth every 6 (six) hours as needed for moderate pain or headache. (Patient not taking: Reported on 05/15/2020)  . amLODipine (NORVASC) 5 MG tablet Take 2.5 mg by mouth daily.   . benazepril (LOTENSIN) 10 MG tablet Take 10 mg by mouth daily.  . diphenhydrAMINE (BENADRYL) 25 MG tablet Take 25 mg by mouth every 6 (six) hours as needed for allergies.   Marland Kitchen docusate sodium (COLACE) 100 MG capsule Take 100 mg by mouth 2 (two) times daily as needed for mild constipation.  Margaret Castaneda 50 MCG tablet TAKE 1 TABLET BY MOUTH DAILY BEFORE BREAKFAST (Patient taking differently: Take 50 mcg by mouth every Monday, Wednesday, and Friday. )  . famotidine (PEPCID) 10 MG tablet Take 10 mg by mouth daily with lunch.   . hydrochlorothiazide (HYDRODIURIL) 12.5 MG tablet Take 12.5 mg by mouth daily.  Marland Kitchen  ibuprofen (ADVIL) 200 MG tablet Take 400 mg by mouth every 8 (eight) hours as needed (pain).  Marland Kitchen levothyroxine (EUTHYROX) 75 MCG tablet Take 1 tablet (75 mcg total) by mouth every Tuesday, Thursday, Saturday, and Sunday. In the morning.  . meclizine (ANTIVERT) 25 MG tablet Take 25 mg by mouth 3 (three) times daily as needed for dizziness.   No facility-administered encounter medications on file as of 08/12/2020.   ALLERGIES: No Known Allergies  VACCINATION STATUS:  There is no immunization history on file for this patient.  HPI Margaret Castaneda is 64 y.o. 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  -She has no new complaints today. She is currently on alternating does of levothyroxine 67mcg and 75 mcg po qam. 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  Subsequent biopsy of 0.7 cm nodule in the left lobe of the thyroid in November 2019 revealed papillary thyroid cancer.  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  -Her Thyrogen stimulated whole-body scan after thyroid remnant ablation was negative for distant metastasis.  Prior to her last visit,  thyroid/neck ultrasound was also negative for residual/recurrent thyroid tissue no cervical lymphadenopathy on December 27, 2019.  -Her labs in March 2020 showed detectable thyroglobulin of 23 with undetectable thyroglobulin antibodies. -Her previsit thyroid function tests are favorable with TSH 0.9, free T4 1.17. -She is currently on levothyroxine 75 mcg and 50 mcg on alternate days.    She reports compliance to her medication.  -She  denies any prior exposure to neck radiation, has 1 sister with thyroid malignancy.  -She remains off of calcium.  Her most recent labs show calcium of 9 improving from 8.3 from immediately after her surgery. -She denies palpitations, heat intolerance, tremors.    Objective:    There were no vitals taken for this visit.  Wt Readings from Last 3 Encounters:  05/15/20 120 lb 12.8 oz (54.8 kg)  03/27/20 120 lb (54.4 kg)  02/13/20  119 lb (54 kg)    Physical Exam   CMP     Component Value Date/Time   NA 141 05/15/2020 1414   K 3.3 (L) 05/15/2020 1414   CL 101 05/15/2020 1414   CO2 31 05/15/2020 1414   GLUCOSE 92 05/15/2020 1414   BUN 15 05/15/2020 1414   CREATININE 0.96 05/15/2020 1414   CREATININE 0.95 05/18/2018 1416   CALCIUM 9.0 05/15/2020 1414   PROT 6.6 05/15/2020 1414   ALBUMIN 4.0 05/15/2020 1414   AST 20 05/15/2020 1414   ALT 14 05/15/2020 1414   ALKPHOS 74 05/15/2020 1414   BILITOT 0.6 05/15/2020 1414   GFRNONAA >60 05/15/2020 1414   GFRAA >60 05/15/2020 1414    Lab Results  Component Value Date   TSH 1.62 08/07/2020   TSH 0.834 05/15/2020   TSH 0.03 (A) 09/18/2019   TSH 0.052 (L) 06/01/2019   TSH 0.411 03/06/2019   TSH 238.909 (H) 02/24/2019   TSH 0.062 (L) 02/01/2019   TSH 1.923 12/30/2018   FREET4 1.46 03/06/2019   FREET4 1.47 02/24/2019     Thyroid/neck ultrasound December 27, 2019 No regional cervical lymphadenopathy.  IMPRESSION: Post total thyroidectomy without evidence of residual or locally recurrent disease.   Assessment & Plan:   1. Postsurgical hypothyroidism 2. 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  She is advised to continue levothyroxine 75 mcg and 50 mcg alternate day dose.  - We discussed about the correct intake of her thyroid hormone, on empty stomach at fasting, with water, separated by at least 30 minutes from breakfast and  other medications,  and separated by more than 4 hours from calcium, iron, multivitamins, acid reflux medications (PPIs). -Patient is made aware of the fact that thyroid hormone replacement is needed for life, dose to be adjusted by periodic monitoring of thyroid function tests.   -She is status post I-131 thyroid remnant ablation with negative post therapy whole-body scan for distant metastasis.  -She has detectable thyroglobulin level 23, with undetectable thyroglobulin antibodies.   Previsit thyroid/neck ultrasound was also negative for residual/recurrent thyroid tissue no cervical lymphadenopathy on December 27, 2019.  - she will be considered for thyrogen stimulated WBS before her next visit.  She will be examined in 6 months and decide on her next imaging study for next year.   - I advised her  to maintain close follow up with Margaret Rad, MD for primary care needs.     - Time spent on this patient care encounter:  20 minutes of which 50% was spent in  counseling and the rest reviewing  her current and  previous labs / studies and medications  doses and developing a plan for long term care. Barbette Reichmann Gordin  participated in the discussions, expressed understanding, and voiced agreement with the above plans.  All questions were answered to her satisfaction. she is encouraged to contact clinic should she have any questions or concerns prior to her return visit.   Follow up plan: Return in about 6 months (around 02/12/2021) for F/U with Pre-visit Labs, F/U with Whole Body Scan w/Thyrogen.   Glade Lloyd, MD [facility] [address] [place], [place] [address] Phone: [phone]  Fax: [phone]     08/12/2020, 6:02 PM  This note was partially dictated with voice recognition software. Similar sounding words can be transcribed inadequately or may not  be corrected upon review.

## 2020-09-10 ENCOUNTER — Other Ambulatory Visit: Payer: Self-pay | Admitting: "Endocrinology

## 2020-10-09 ENCOUNTER — Telehealth: Payer: Self-pay

## 2020-10-09 NOTE — Telephone Encounter (Signed)
Yes, they can be moved.

## 2020-10-09 NOTE — Telephone Encounter (Signed)
Discussed with pt, understanding voiced. Pt stated she would call [facility] and reschedule for a convenient date and time in December.

## 2020-10-09 NOTE — Telephone Encounter (Signed)
Patient said that her nuc med whole body scans are scheduled for January.  She is asking if she can have these moved to to December because her deductible will start over in January

## 2020-10-29 ENCOUNTER — Other Ambulatory Visit: Payer: Self-pay | Admitting: "Endocrinology

## 2020-11-13 ENCOUNTER — Inpatient Hospital Stay (HOSPITAL_COMMUNITY): Payer: BC Managed Care – PPO | Attending: Hematology | Admitting: Hematology

## 2020-11-13 ENCOUNTER — Other Ambulatory Visit: Payer: Self-pay

## 2020-11-13 ENCOUNTER — Inpatient Hospital Stay (HOSPITAL_COMMUNITY): Payer: BC Managed Care – PPO

## 2020-11-13 VITALS — BP 124/69 | HR 74 | Temp 96.8°F | Resp 18 | Wt 119.7 lb

## 2020-11-13 DIAGNOSIS — C211 Malignant neoplasm of anal canal: Secondary | ICD-10-CM

## 2020-11-13 DIAGNOSIS — C73 Malignant neoplasm of thyroid gland: Secondary | ICD-10-CM | POA: Insufficient documentation

## 2020-11-13 LAB — COMPREHENSIVE METABOLIC PANEL
ALT: 15 U/L (ref 0–44)
AST: 22 U/L (ref 15–41)
Albumin: 4.3 g/dL (ref 3.5–5.0)
Alkaline Phosphatase: 77 U/L (ref 38–126)
Anion gap: 9 (ref 5–15)
BUN: 15 mg/dL (ref 8–23)
CO2: 32 mmol/L (ref 22–32)
Calcium: 9.5 mg/dL (ref 8.9–10.3)
Chloride: 99 mmol/L (ref 98–111)
Creatinine, Ser: 1.05 mg/dL — ABNORMAL HIGH (ref 0.44–1.00)
GFR, Estimated: 59 mL/min — ABNORMAL LOW (ref >60)
Glucose, Bld: 82 mg/dL (ref 70–99)
Potassium: 3.5 mmol/L (ref 3.5–5.1)
Sodium: 140 mmol/L (ref 135–145)
Total Bilirubin: 0.6 mg/dL (ref 0.3–1.2)
Total Protein: 7.1 g/dL (ref 6.5–8.1)

## 2020-11-13 LAB — CBC WITH DIFFERENTIAL/PLATELET
Abs Immature Granulocytes: 0.01 10*3/uL (ref 0.00–0.07)
Basophils Absolute: 0.1 10*3/uL (ref 0.0–0.1)
Basophils Relative: 1 %
Eosinophils Absolute: 0.1 10*3/uL (ref 0.0–0.5)
Eosinophils Relative: 2 %
HCT: 39.4 % (ref 36.0–46.0)
Hemoglobin: 12.7 g/dL (ref 12.0–15.0)
Immature Granulocytes: 0 %
Lymphocytes Relative: 26 %
Lymphs Abs: 1.7 10*3/uL (ref 0.7–4.0)
MCH: 30.2 pg (ref 26.0–34.0)
MCHC: 32.2 g/dL (ref 30.0–36.0)
MCV: 93.6 fL (ref 80.0–100.0)
Monocytes Absolute: 0.6 10*3/uL (ref 0.1–1.0)
Monocytes Relative: 9 %
Neutro Abs: 3.9 10*3/uL (ref 1.7–7.7)
Neutrophils Relative %: 62 %
Platelets: 287 10*3/uL (ref 150–400)
RBC: 4.21 MIL/uL (ref 3.87–5.11)
RDW: 13.2 % (ref 11.5–15.5)
WBC: 6.4 10*3/uL (ref 4.0–10.5)
nRBC: 0 % (ref 0.0–0.2)

## 2020-11-13 NOTE — Progress Notes (Signed)
[place], [place] [address]   CLINIC:  Medical Oncology/Hematology  PCP:  Kennieth Rad, MD [facility] [address] 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  PRIOR THERAPY: Concurrent chemoradiation with Xeloda and mitomycin from 07/05/2018 to 08/17/2018  NGS Results: Not done  CURRENT THERAPY: Observation  BRIEF ONCOLOGIC HISTORY:  Oncology History  Primary cancer of anal canal ([place])  06/02/2018 Initial Diagnosis   Primary cancer of anal canal ([place])   07/05/2018 - 08/01/2018 Chemotherapy   The patient had ondansetron (ZOFRAN) 8 mg in sodium chloride 0.9 % 50 mL IVPB, , Intravenous,  Once, 1 of 1 cycle Administration:  (07/05/2018),  (08/01/2018) mitoMYcin (MUTAMYCIN) chemo injection 15.5 mg, 10 mg/m2 = 15.5 mg, Intravenous,  Once, 1 of 1 cycle Administration: 15.5 mg (07/05/2018), 15.5 mg (08/01/2018)  for chemotherapy treatment.      CANCER STAGING: Cancer Staging No matching staging information was found for the patient.  INTERVAL HISTORY:  [name], a 64 y.o. female, returns for routine follow-up of her squamous cell carcinoma of the anal canal. Naidelin was last seen on 05/15/2020.   Today she is accompanied by her husband and she reports feeling well. She saw Dr. Laural Golden in March and will see him in March of 2022. She denies having melena or hematochezia or anal pain. She denies having any more incontinence.  She continues working three days a week and tolerating it well.   REVIEW OF SYSTEMS:  Review of Systems  Constitutional: Positive for fatigue (90%). Negative for appetite change.  Gastrointestinal: Negative for blood in stool and rectal pain.  Genitourinary: Positive for difficulty urinating (d/t radiation).   All other systems reviewed and are negative.   PAST MEDICAL/SURGICAL HISTORY:  Past Medical History:  Diagnosis Date  .  Cancer ([place])    Anal Cancer  . History of kidney stones   . Hypertension   . Neuropathy   . Rectal mass 05/18/2018   Past Surgical History:  Procedure Laterality Date  . COLONOSCOPY N/A 05/24/2018   Procedure: COLONOSCOPY;  Surgeon: Rogene Houston, MD;  [facility];  Service: Endoscopy;  Laterality: N/A;  7:30  . FLEXIBLE SIGMOIDOSCOPY N/A 03/02/2019   Procedure: FLEXIBLE SIGMOIDOSCOPY;  Surgeon: Rogene Houston, MD;  [facility];  Service: Endoscopy;  Laterality: N/A;  245  . FLEXIBLE SIGMOIDOSCOPY N/A 03/27/2020   Procedure: FLEXIBLE SIGMOIDOSCOPY;  Surgeon: Rogene Houston, MD;  [facility];  Service: Endoscopy;  Laterality: N/A;  1145  . reversal tubal ligation    . SIGMOIDOSCOPY  01/2019  . THYROIDECTOMY N/A 01/04/2019   Procedure: TOTAL THYROIDECTOMY;  Surgeon: Aviva Signs, MD;  Location: [facility];  Service: General;  Laterality: N/A;  . TONSILLECTOMY    . TUBAL LIGATION      SOCIAL HISTORY:  Social History   Socioeconomic History  . Marital status: Married    Spouse name: Not on file  . Number of children: Not on file  . Years of education: Not on file  . Highest education level: Not on file  Occupational History  . Not on file  Tobacco Use  . Smoking status: Never Smoker  . Smokeless tobacco: Never Used  Vaping Use  . Vaping Use: Never used  Substance and Sexual Activity  . Alcohol use: Never  . Drug use: Never  . Sexual activity:  Yes    Birth control/protection: Surgical  Other Topics Concern  . Not on file  Social History Narrative  . Not on file   Social Determinants of Health   Financial Resource Strain:   . Difficulty of Paying Living Expenses: Not on file  Food Insecurity:   . Worried About Charity fundraiser in the Last Year: Not on file  . Ran Out of Food in the Last Year: Not on file  Transportation Needs:   . Lack of Transportation (Medical): Not on file  . Lack of Transportation (Non-Medical): Not on file    Physical Activity:   . Days of Exercise per Week: Not on file  . Minutes of Exercise per Session: Not on file  Stress:   . Feeling of Stress : Not on file  Social Connections:   . Frequency of Communication with Friends and Family: Not on file  . Frequency of Social Gatherings with Friends and Family: Not on file  . Attends Religious Services: Not on file  . Active Member of Clubs or Organizations: Not on file  . Attends Archivist Meetings: Not on file  . Marital Status: Not on file  Intimate Partner Violence:   . Fear of Current or Ex-Partner: Not on file  . Emotionally Abused: Not on file  . Physically Abused: Not on file  . Sexually Abused: Not on file    FAMILY HISTORY:  Family History  Problem Relation Age of Onset  . Gastric cancer Paternal Grandfather   . Hypertension Mother   . Kidney cancer Father   . Stroke Father   . Heart attack Father   . Thyroid cancer Sister   . Hypertension Brother   . Breast cancer Maternal Grandmother   . Colon cancer Neg Hx     CURRENT MEDICATIONS:  Current Outpatient Medications  Medication Sig Dispense Refill  . acetaminophen (TYLENOL) 500 MG tablet Take 1 tablet (500 mg total) by mouth every 6 (six) hours as needed for moderate pain or headache. 30 tablet 0  . amLODipine (NORVASC) 5 MG tablet Take 2.5 mg by mouth daily.   4  . benazepril (LOTENSIN) 10 MG tablet Take 10 mg by mouth daily.    . diphenhydrAMINE (BENADRYL) 25 MG tablet Take 25 mg by mouth every 6 (six) hours as needed for allergies.     Marland Kitchen docusate sodium (COLACE) 100 MG capsule Take 100 mg by mouth 2 (two) times daily as needed for mild constipation.    Arna Medici 75 MCG tablet TAKE 1 TABLET BY MOUTH IN THE MORNING EVERY TUESDAY, THURSDAY, SATURDAY, AND SUNDAY 30 tablet 0  . famotidine (PEPCID) 10 MG tablet Take 10 mg by mouth daily with lunch.     . hydrochlorothiazide (HYDRODIURIL) 12.5 MG tablet Take 12.5 mg by mouth daily.    Marland Kitchen ibuprofen (ADVIL) 200 MG  tablet Take 400 mg by mouth every 8 (eight) hours as needed (pain).    Marland Kitchen levothyroxine (EUTHYROX) 50 MCG tablet Take 1 tablet (50 mcg total) by mouth daily before breakfast. Take one tab every Monday,Wednesday and Friday 30 tablet 3  . meclizine (ANTIVERT) 25 MG tablet Take 25 mg by mouth 3 (three) times daily as needed for dizziness.     No current facility-administered medications for this visit.    ALLERGIES:  No Known Allergies  PHYSICAL EXAM:  Performance status (ECOG): 0 - Asymptomatic  Vitals:   11/13/20 1545  BP: 124/69  Pulse: 74  Resp: 18  Temp: (!) 96.8 F (36 C)  SpO2: 100%   Wt Readings from Last 3 Encounters:  11/13/20 119 lb 11.2 oz (54.3 kg)  05/15/20 120 lb 12.8 oz (54.8 kg)  03/27/20 120 lb (54.4 kg)   Physical Exam Vitals reviewed.  Constitutional:      Appearance: Normal appearance.  Cardiovascular:     Rate and Rhythm: Normal rate and regular rhythm.     Pulses: Normal pulses.     Heart sounds: Normal heart sounds.  Pulmonary:     Effort: Pulmonary effort is normal.     Breath sounds: Normal breath sounds.  Abdominal:     Hernia: No hernia is present.  Lymphadenopathy:     Cervical: No cervical adenopathy.     Upper Body:     Right upper body: No supraclavicular adenopathy.     Left upper body: No supraclavicular adenopathy.     Lower Body: No right inguinal adenopathy. No left inguinal adenopathy.  Neurological:     General: No focal deficit present.     Mental Status: She is alert and oriented to person, place, and time.  Psychiatric:        Mood and Affect: Mood normal.        Behavior: Behavior normal.      LABORATORY DATA:  I have reviewed the labs as listed.  CBC Latest Ref Rng & Units 11/13/2020 05/15/2020 10/12/2019  WBC 4.0 - 10.5 K/uL 6.4 5.6 5.9  Hemoglobin 12.0 - 15.0 g/dL 12.7 12.0 13.6  Hematocrit 36 - 46 % 39.4 36.9 41.6  Platelets 150 - 400 K/uL 287 237 279   CMP Latest Ref Rng & Units 11/13/2020 05/15/2020 10/12/2019    Glucose 70 - 99 mg/dL 82 92 99  BUN 8 - 23 mg/dL 15 15 11   Creatinine 0.44 - 1.00 mg/dL 1.05(H) 0.96 1.00  Sodium 135 - 145 mmol/L 140 141 141  Potassium 3.5 - 5.1 mmol/L 3.5 3.3(L) 4.4  Chloride 98 - 111 mmol/L 99 101 99  CO2 22 - 32 mmol/L 32 31 32  Calcium 8.9 - 10.3 mg/dL 9.5 9.0 9.8  Total Protein 6.5 - 8.1 g/dL 7.1 6.6 7.4  Total Bilirubin 0.3 - 1.2 mg/dL 0.6 0.6 0.8  Alkaline Phos 38 - 126 U/L 77 74 90  AST 15 - 41 U/L 22 20 22   ALT 0 - 44 U/L 15 14 16     DIAGNOSTIC IMAGING:  I have independently reviewed the scans and discussed with the patient. No results found.   ASSESSMENT:  1.  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 -Flex sigmoidoscopy on 03/06/2019 showed normal sigmoid colon, descending colon and rectosigmoid junction. -CTAP on 10/12/2019 did not show any evidence of recurrent malignancy.  Stable hepatic hypodensities likely cysts. -Flexible sigmoidoscopy on 03/27/2020 shows proximal rectum, rectosigmoid colon and sigmoid colon distal descending colon within normal limits.  A few nonbleeding distal rectal angiodysplastic lesions.  Anal papillae were hypertrophic.  Physical exam today did not reveal any palpable inguinal lymph nodes. -We will see her back in 6 months for follow-up.  She will have surveillance visits with DRE every 3 to 6 months for 5 years with inguinal lymph node palpation.  Sigmoidoscopy every 6 to 12 months for 3 years.  2.  Right breast lump: -CT scan showed incidental right breast lump.  Patient reports she is aware of this lump for 25 years.  A needle biopsy at that  time was benign.  Serial mammograms did not show any growth.  3.  Papillary thyroid cancer: -Total thyroidectomy on 01/04/2019 which showed at least 2 foci, both in the left lobe measuring 0.6 and 0.1 cm.  0.6 cm lesion is the classic form of papillary thyroid carcinoma.  Incidentally found 0.1 cm nodule is  follicular variant of papillary thyroid carcinoma.  No lymph nodes were examined.  PT1AP NX.  No lymphovascular invasion.  Margins negative. -She is status post iodine-131 thyroid remnant ablation. -She is currently on Synthroid and follows up with Dr. Dorris Fetch.   PLAN:  1.  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  No palpable inguinal lymphadenopathy. -Reviewed labs which were within normal limits. -Follow-up with Dr.Rehman in March for anoscopy. -RTC 6 months for follow-up.    Orders placed this encounter:  No orders of the defined types were placed in this encounter.    Derek Jack, MD [place] [phone]   I, Milinda Antis, am acting as a scribe for Dr. Sanda Linger.  I, Derek Jack MD, have reviewed the above documentation for accuracy and completeness, and I agree with the above.

## 2020-11-13 NOTE — Patient Instructions (Addendum)
[facility] Discharge Instructions  You were seen today by Dr. Katragadda. He went over your recent results. Dr. Katragadda will see you back in 6 months for labs and follow up.   Thank you for choosing  [facility] to provide your oncology and hematology care.  To afford each patient quality time with our provider, please arrive at least 15 minutes before your scheduled appointment time.   If you have a lab appointment with the Cancer Center please come in thru the Main Entrance and check in at the main information desk  You need to re-schedule your appointment should you arrive 10 or more minutes late.  We strive to give you quality time with our providers, and arriving late affects you and other patients whose appointments are after yours.  Also, if you no show three or more times for appointments you may be dismissed from the clinic at the providers discretion.     Again, thank you for choosing [facility].  Our hope is that these requests will decrease the amount of time that you wait before being seen by our physicians.       _____________________________________________________________  Should you have questions after your visit to  Cancer Center, please contact our office at [phone] between the hours of 8:00 a.m. and 4:30 p.m.  Voicemails left after 4:00 p.m. will not be returned until the following business day.  For prescription refill requests, have your pharmacy contact our office and allow 72 hours.    Cancer Center Support Programs:   > Cancer Support Group  2nd Tuesday of the month 1pm-2pm, Journey Room    

## 2020-12-11 ENCOUNTER — Encounter (HOSPITAL_COMMUNITY): Payer: Self-pay

## 2020-12-11 ENCOUNTER — Other Ambulatory Visit: Payer: Self-pay

## 2020-12-11 ENCOUNTER — Encounter (HOSPITAL_COMMUNITY)
Admission: RE | Admit: 2020-12-11 | Discharge: 2020-12-11 | Disposition: A | Discharge status: Home / Self Care | Payer: BC Managed Care – PPO | Source: Ambulatory Visit | Attending: "Endocrinology | Admitting: "Endocrinology

## 2020-12-11 DIAGNOSIS — C73 Malignant neoplasm of thyroid gland: Secondary | ICD-10-CM | POA: Diagnosis present

## 2020-12-11 MED ORDER — THYROTROPIN ALFA 0.9 MG IM SOLR
INTRAMUSCULAR | Status: AC
  Administered 2020-12-11: 0.9 mg via INTRAMUSCULAR
  Filled 2020-12-11: qty 0.9

## 2020-12-11 MED ORDER — STERILE WATER FOR INJECTION IJ SOLN
INTRAMUSCULAR | Status: AC
  Administered 2020-12-11: 1.2 mL
  Filled 2020-12-11: qty 10

## 2020-12-11 MED ORDER — THYROTROPIN ALFA 0.9 MG IM SOLR: 0.9000 mg | INTRAMUSCULAR | Status: AC

## 2020-12-11 NOTE — Discharge Instructions (Signed)
Thyrotropin Alfa injection What is this medicine? THYROTROPIN ALFA (thahy ruh TROH pin AL fa) is a man-made protein. It is used to diagnose any remaining thyroid cancer after treatment. It is also used to help treat thyroid cancer. This medicine may be used for other purposes; ask your health care provider or pharmacist if you have questions. COMMON BRAND NAME(S): Thyrogen What should I tell my health care provider before I take this medicine? They need to know if you have any of these conditions:  cancer that has spread to other parts of the body  have thyroid tissue remaining  heart disease  kidney disease  smoke tobacco  an unusual or allergic reaction to thyrotropin, thyroid products, other hormones, medicines, foods, or preservatives  pregnant or trying to get pregnant  breast-feeding How should I use this medicine? This medicine is for injection into a muscle. It is given by a health care professional in a hospital or clinic setting. Talk to your pediatrician regarding the use of this medicine in children. Special care may be needed. Overdosage: If you think you have taken too much of this medicine contact a poison control center or emergency room at once. NOTE: This medicine is only for you. Do not share this medicine with others. What if I miss a dose? It is important not to miss your dose. Call your doctor or health care professional if you are unable to keep an appointment. What may interact with this medicine? Interactions are not expected. This list may not describe all possible interactions. Give your health care provider a list of all the medicines, herbs, non-prescription drugs, or dietary supplements you use. Also tell them if you smoke, drink alcohol, or use illegal drugs. Some items may interact with your medicine. What should I watch for while using this medicine? Visit your doctor or health care professional regularly. Your condition will be monitored carefully  while you are receiving this medicine. You may need blood work done while you are taking this medicine. What side effects may I notice from receiving this medicine? Side effects that you should report to your doctor or health care professional as soon as possible:  allergic reactions like skin rash, itching or hives, swelling of the face, lips, or tongue  breathing problems  chest pain  signs and symptoms of a stroke like changes in vision; confusion; trouble speaking or understanding; severe headaches; sudden numbness or weakness of the face, arm or leg; trouble walking; dizziness; loss of balance or coordination Side effects that usually do not require medical attention (report to your doctor or health care professional if they continue or are bothersome):  dizziness  flu-like symptoms  headache  nausea, vomiting  weak or tired This list may not describe all possible side effects. Call your doctor for medical advice about side effects. You may report side effects to FDA at 1-800-FDA-1088. Where should I keep my medicine? This drug is given in a hospital or clinic and will not be stored at home. NOTE: This sheet is a summary. It may not cover all possible information. If you have questions about this medicine, talk to your doctor, pharmacist, or health care provider.  2020 Elsevier/Gold Standard (2015-05-10 15:04:47)

## 2020-12-12 ENCOUNTER — Encounter (HOSPITAL_COMMUNITY)
Admission: RE | Admit: 2020-12-12 | Discharge: 2020-12-12 | Disposition: A | Discharge status: Home / Self Care | Payer: BC Managed Care – PPO | Source: Ambulatory Visit | Attending: "Endocrinology | Admitting: "Endocrinology

## 2020-12-12 DIAGNOSIS — C73 Malignant neoplasm of thyroid gland: Secondary | ICD-10-CM | POA: Diagnosis not present

## 2020-12-12 MED ORDER — THYROTROPIN ALFA 0.9 MG IM SOLR
0.9000 mg | INTRAMUSCULAR | Status: AC
  Administered 2020-12-12: 0.9 mg via INTRAMUSCULAR

## 2020-12-12 MED ORDER — THYROTROPIN ALFA 0.9 MG IM SOLR
INTRAMUSCULAR | Status: AC
  Filled 2020-12-12: qty 0.9

## 2020-12-12 MED ORDER — STERILE WATER FOR INJECTION IJ SOLN
1.2000 mL | Freq: Once | INTRAMUSCULAR | Status: AC
  Administered 2020-12-12: 1.2 mL via INTRAMUSCULAR

## 2020-12-12 MED ORDER — STERILE WATER FOR INJECTION IJ SOLN
INTRAMUSCULAR | Status: AC
  Filled 2020-12-12: qty 10

## 2020-12-12 NOTE — Sedation Documentation (Signed)
PT tolerated Thyrogen injection well today with no acute distress noted. PT verbalized understanding of discharge instructions and will return tomorrow as scheduled to nuclear medicine. PT ambulatory back to waiting area at time of discharge and denied any complaints.

## 2020-12-12 NOTE — Discharge Instructions (Signed)
Thyrotropin Alfa injection What is this medicine? THYROTROPIN ALFA (thahy ruh TROH pin AL fa) is a man-made protein. It is used to diagnose any remaining thyroid cancer after treatment. It is also used to help treat thyroid cancer. This medicine may be used for other purposes; ask your health care provider or pharmacist if you have questions. COMMON BRAND NAME(S): Thyrogen What should I tell my health care provider before I take this medicine? They need to know if you have any of these conditions:  cancer that has spread to other parts of the body  have thyroid tissue remaining  heart disease  kidney disease  smoke tobacco  an unusual or allergic reaction to thyrotropin, thyroid products, other hormones, medicines, foods, or preservatives  pregnant or trying to get pregnant  breast-feeding How should I use this medicine? This medicine is for injection into a muscle. It is given by a health care professional in a hospital or clinic setting. Talk to your pediatrician regarding the use of this medicine in children. Special care may be needed. Overdosage: If you think you have taken too much of this medicine contact a poison control center or emergency room at once. NOTE: This medicine is only for you. Do not share this medicine with others. What if I miss a dose? It is important not to miss your dose. Call your doctor or health care professional if you are unable to keep an appointment. What may interact with this medicine? Interactions are not expected. This list may not describe all possible interactions. Give your health care provider a list of all the medicines, herbs, non-prescription drugs, or dietary supplements you use. Also tell them if you smoke, drink alcohol, or use illegal drugs. Some items may interact with your medicine. What should I watch for while using this medicine? Visit your doctor or health care professional regularly. Your condition will be monitored carefully  while you are receiving this medicine. You may need blood work done while you are taking this medicine. What side effects may I notice from receiving this medicine? Side effects that you should report to your doctor or health care professional as soon as possible:  allergic reactions like skin rash, itching or hives, swelling of the face, lips, or tongue  breathing problems  chest pain  signs and symptoms of a stroke like changes in vision; confusion; trouble speaking or understanding; severe headaches; sudden numbness or weakness of the face, arm or leg; trouble walking; dizziness; loss of balance or coordination Side effects that usually do not require medical attention (report to your doctor or health care professional if they continue or are bothersome):  dizziness  flu-like symptoms  headache  nausea, vomiting  weak or tired This list may not describe all possible side effects. Call your doctor for medical advice about side effects. You may report side effects to FDA at 1-800-FDA-1088. Where should I keep my medicine? This drug is given in a hospital or clinic and will not be stored at home. NOTE: This sheet is a summary. It may not cover all possible information. If you have questions about this medicine, talk to your doctor, pharmacist, or health care provider.  2020 Elsevier/Gold Standard (2015-05-10 15:04:47)

## 2020-12-13 ENCOUNTER — Encounter (HOSPITAL_COMMUNITY)
Admission: RE | Admit: 2020-12-13 | Discharge: 2020-12-13 | Disposition: A | Discharge status: Home / Self Care | Payer: BC Managed Care – PPO | Source: Ambulatory Visit | Attending: "Endocrinology | Admitting: "Endocrinology

## 2020-12-13 ENCOUNTER — Encounter (HOSPITAL_COMMUNITY): Payer: Self-pay

## 2020-12-13 ENCOUNTER — Other Ambulatory Visit: Payer: Self-pay

## 2020-12-13 ENCOUNTER — Other Ambulatory Visit (HOSPITAL_COMMUNITY)
Admission: RE | Admit: 2020-12-13 | Discharge: 2020-12-13 | Disposition: A | Discharge status: Home / Self Care | Payer: BC Managed Care – PPO | Source: Ambulatory Visit | Attending: "Endocrinology | Admitting: "Endocrinology

## 2020-12-13 DIAGNOSIS — C73 Malignant neoplasm of thyroid gland: Secondary | ICD-10-CM | POA: Insufficient documentation

## 2020-12-13 DIAGNOSIS — E890 Postprocedural hypothyroidism: Secondary | ICD-10-CM | POA: Insufficient documentation

## 2020-12-13 LAB — T4, FREE: Free T4: 1.1 ng/dL (ref 0.61–1.12)

## 2020-12-13 LAB — TSH: TSH: 330.657 u[IU]/mL — ABNORMAL HIGH (ref 0.350–4.500)

## 2020-12-13 MED ORDER — SODIUM IODIDE I 131 CAPSULE
4.0000 | Freq: Once | INTRAVENOUS | Status: AC | PRN
  Administered 2020-12-13: 3.9 via ORAL

## 2020-12-16 ENCOUNTER — Encounter (HOSPITAL_COMMUNITY)
Admission: RE | Admit: 2020-12-16 | Discharge: 2020-12-16 | Disposition: A | Discharge status: Home / Self Care | Payer: BC Managed Care – PPO | Source: Ambulatory Visit | Attending: "Endocrinology | Admitting: "Endocrinology

## 2020-12-16 ENCOUNTER — Other Ambulatory Visit: Payer: Self-pay

## 2020-12-16 DIAGNOSIS — C73 Malignant neoplasm of thyroid gland: Secondary | ICD-10-CM | POA: Diagnosis not present

## 2020-12-16 LAB — THYROGLOBULIN ANTIBODY: Thyroglobulin Antibody: <1 IU/mL (ref 0.0–0.9)

## 2020-12-21 LAB — THYROGLOBULIN LEVEL: Thyroglobulin: <2 ng/mL

## 2021-01-08 ENCOUNTER — Other Ambulatory Visit: Payer: Self-pay | Admitting: "Endocrinology

## 2021-01-08 ENCOUNTER — Other Ambulatory Visit (HOSPITAL_COMMUNITY): Payer: BC Managed Care – PPO

## 2021-01-09 ENCOUNTER — Other Ambulatory Visit (HOSPITAL_COMMUNITY): Payer: BC Managed Care – PPO

## 2021-01-10 ENCOUNTER — Other Ambulatory Visit (HOSPITAL_COMMUNITY): Payer: BC Managed Care – PPO

## 2021-01-13 ENCOUNTER — Other Ambulatory Visit (HOSPITAL_COMMUNITY): Payer: BC Managed Care – PPO

## 2021-02-12 ENCOUNTER — Ambulatory Visit: Payer: BC Managed Care – PPO | Admitting: "Endocrinology

## 2021-02-17 ENCOUNTER — Encounter: Payer: Self-pay | Admitting: "Endocrinology

## 2021-02-17 LAB — TSH: TSH: 3.57 (ref 0.41–5.90)

## 2021-02-19 ENCOUNTER — Ambulatory Visit: Payer: BC Managed Care – PPO | Admitting: "Endocrinology

## 2021-02-19 ENCOUNTER — Other Ambulatory Visit: Payer: Self-pay | Admitting: "Endocrinology

## 2021-02-19 ENCOUNTER — Other Ambulatory Visit: Payer: Self-pay

## 2021-02-19 ENCOUNTER — Encounter: Payer: Self-pay | Admitting: "Endocrinology

## 2021-02-19 VITALS — BP 116/64 | HR 68 | Ht 61.0 in | Wt 121.0 lb

## 2021-02-19 DIAGNOSIS — E890 Postprocedural hypothyroidism: Secondary | ICD-10-CM | POA: Diagnosis not present

## 2021-02-19 DIAGNOSIS — C73 Malignant neoplasm of thyroid gland: Secondary | ICD-10-CM | POA: Diagnosis not present

## 2021-02-19 MED ORDER — LEVOTHYROXINE SODIUM 50 MCG PO TABS: 50.0000 ug | ORAL_TABLET | Freq: Every day | ORAL | 1 refills | Status: DC

## 2021-02-19 MED ORDER — LEVOTHYROXINE SODIUM 75 MCG PO TABS
ORAL_TABLET | ORAL | 1 refills | Status: DC
Start: 2021-02-19 — End: 2021-10-31

## 2021-02-19 NOTE — Progress Notes (Signed)
02/19/2021, 3:13 PM         Endocrinology follow-up note    Subjective:    Patient ID: Margaret Castaneda, female    DOB: [birth year], PCP Kennieth Rad, MD   Past Medical History:  Diagnosis Date  . Cancer [facility])    Anal Cancer, Thyroid Cancer  . History of kidney stones   . Hypertension   . Neuropathy   . Rectal mass 05/18/2018   Past Surgical History:  Procedure Laterality Date  . COLONOSCOPY N/A 05/24/2018   Procedure: COLONOSCOPY;  Surgeon: Rogene Houston, MD;  Location[facility];  Service: Endoscopy;  Laterality: N/A;  7:30  . FLEXIBLE SIGMOIDOSCOPY N/A 03/02/2019   Procedure: FLEXIBLE SIGMOIDOSCOPY;  Surgeon: Rogene Houston, MD;  Location: [facility];  Service: Endoscopy;  Laterality: N/A;  245  . FLEXIBLE SIGMOIDOSCOPY N/A 03/27/2020   Procedure: FLEXIBLE SIGMOIDOSCOPY;  Surgeon: Rogene Houston, MD;  Location: [facility];  Service: Endoscopy;  Laterality: N/A;  1145  . reversal tubal ligation    . SIGMOIDOSCOPY  01/2019  . THYROIDECTOMY N/A 01/04/2019   Procedure: TOTAL THYROIDECTOMY;  Surgeon: Aviva Signs, MD;  Location: [facility];  Service: General;  Laterality: N/A;  . TONSILLECTOMY    . TUBAL LIGATION     Social History   Socioeconomic History  . Marital status: Married    Spouse name: Not on file  . Number of children: Not on file  . Years of education: Not on file  . Highest education level: Not on file  Occupational History  . Not on file  Tobacco Use  . Smoking status: Never Smoker  . Smokeless tobacco: Never Used  Vaping Use  . Vaping Use: Never used  Substance and Sexual Activity  . Alcohol use: Never  . Drug use: Never  . Sexual activity: Yes    Birth control/protection: Surgical  Other Topics Concern  . Not on file  Social History Narrative  . Not on file   Social Determinants of Health   Financial Resource Strain: Not on file  Food Insecurity: Not on file  Transportation  Needs: Not on file  Physical Activity: Not on file  Stress: Not on file  Social Connections: Not on file   Outpatient Encounter Medications as of 02/19/2021  Medication Sig  . acetaminophen (TYLENOL) 500 MG tablet Take 1 tablet (500 mg total) by mouth every 6 (six) hours as needed for moderate pain or headache.  Marland Kitchen amLODipine (NORVASC) 5 MG tablet Take 2.5 mg by mouth daily.   . benazepril (LOTENSIN) 10 MG tablet Take 10 mg by mouth daily.  . diphenhydrAMINE (BENADRYL) 25 MG tablet Take 25 mg by mouth every 6 (six) hours as needed for allergies.   Marland Kitchen docusate sodium (COLACE) 100 MG capsule Take 100 mg by mouth 2 (two) times daily as needed for mild constipation.  . famotidine (PEPCID) 10 MG tablet Take 10 mg by mouth daily with lunch.   . hydrochlorothiazide (HYDRODIURIL) 12.5 MG tablet Take 12.5 mg by mouth daily.  Marland Kitchen ibuprofen (ADVIL) 200 MG tablet Take 400 mg by mouth every 8 (eight) hours as needed (pain).  Marland Kitchen levothyroxine (EUTHYROX) 50 MCG tablet Take 1 tablet (50  mcg total) by mouth daily before breakfast. Take one tab every Monday,Wednesday and Friday  . levothyroxine (EUTHYROX) 75 MCG tablet TAKE ONE TABLET BY MOUTH IN THE MORNING EVERY TUESDAY, THURSDAY, SATURDAY AND SUNDAY  . meclizine (ANTIVERT) 25 MG tablet Take 25 mg by mouth 3 (three) times daily as needed for dizziness.  . [DISCONTINUED] EUTHYROX 75 MCG tablet TAKE ONE TABLET BY MOUTH IN THE MORNING EVERY TUESDAY, THURSDAY, SATURDAY AND SUNDAY  . [DISCONTINUED] levothyroxine (EUTHYROX) 50 MCG tablet Take 1 tablet (50 mcg total) by mouth daily before breakfast. Take one tab every Monday,Wednesday and Friday   No facility-administered encounter medications on file as of 02/19/2021.   ALLERGIES: No Known Allergies  VACCINATION STATUS: Immunization History  Administered Date(s) Administered  . Influenza-Unspecified 11/01/2020    HPI Margaret Castaneda is 65 y.o. 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  -She has no new complaints today. She is currently on alternating does of levothyroxine 62mcg and 75 mcg po qam. 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  Subsequent biopsy of 0.7 cm nodule in the left lobe of the thyroid in November 2019 revealed papillary thyroid cancer.  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  -Her Thyrogen stimulated whole-body scan after thyroid remnant ablation was negative for distant metastasis.  Prior to her last visit,  thyroid/neck ultrasound was also negative for residual/recurrent thyroid tissue no cervical lymphadenopathy on December 27, 2019.  Her most recent Thyrogen stimulated whole-body scan on December 16, 2020 showed: Small focus of radio iodine accumulation in the RIGHT cervical region. This is faint and nonspecific; this could represent residual tracer within the RIGHT thyroid bed, subtle nodal disease, or mild physiologic accumulation of tracer in the esophagus from salivary origin.  Recommend thyroid ultrasound and correlation with thyroglobulin levels.  -Her labs in March 2020 showed detectable thyroglobulin of 23 with undetectable thyroglobulin antibodies. -Her previsit thyroid function tests are consistent with appropriate replacement with free T4 1.1, TSH 3.57.  This TSH was dropping from Thyrogen effect from 330.    -She is currently on levothyroxine 75 mcg and 50 mcg on alternate days.    She reports compliance to her medication.  She feels better, has no new complaints today.  -She denies any prior exposure to neck radiation, has 1  sister with thyroid malignancy.  -She remains off of calcium.  Her most recent labs show calcium of 9.5 improving from 8.3  from immediately after her surgery. -She denies palpitations, heat intolerance, tremors.    Objective:    BP 116/64   Pulse 68   Ht 5\' 1"  (1.549 m)   Wt 121 lb (54.9 kg)   BMI 22.86 kg/m   Wt Readings from Last 3 Encounters:  02/19/21 121 lb (54.9 kg)  11/13/20 119 lb 11.2 oz (54.3 kg)  05/15/20 120 lb 12.8 oz (54.8 kg)    Physical Exam   CMP     Component Value Date/Time   NA 140 11/13/2020 1507   K 3.5 11/13/2020 1507   CL 99 11/13/2020 1507   CO2 32 11/13/2020 1507   GLUCOSE 82  11/13/2020 1507   BUN 15 11/13/2020 1507   CREATININE 1.05 (H) 11/13/2020 1507   CREATININE 0.95 05/18/2018 1416   CALCIUM 9.5 11/13/2020 1507   PROT 7.1 11/13/2020 1507   ALBUMIN 4.3 11/13/2020 1507   AST 22 11/13/2020 1507   ALT 15 11/13/2020 1507   ALKPHOS 77 11/13/2020 1507   BILITOT 0.6 11/13/2020 1507   GFRNONAA 59 (L) 11/13/2020 1507   GFRAA >60 05/15/2020 1414    Lab Results  Component Value Date   TSH 3.57 02/17/2021   TSH 330.657 (H) 12/13/2020   TSH 1.62 08/07/2020   TSH 0.834 05/15/2020   TSH 0.03 (A) 09/18/2019   TSH 0.052 (L) 06/01/2019   TSH 0.411 03/06/2019   TSH 238.909 (H) 02/24/2019   TSH 0.062 (L) 02/01/2019   TSH 1.923 12/30/2018   FREET4 1.10 12/13/2020   FREET4 1.46 03/06/2019   FREET4 1.47 02/24/2019     Thyroid/neck ultrasound December 27, 2019 No regional cervical lymphadenopathy.  IMPRESSION: Post total thyroidectomy without evidence of residual or locally recurrent disease.   Most recent thyroid instability whole-body scan on December 16, 2020 IMPRESSION: Small focus of radio iodine accumulation in the RIGHT cervical region.  This is faint and nonspecific; this could represent residual tracer within the RIGHT thyroid bed, subtle nodal disease, or mild physiologic accumulation of tracer in the esophagus from  salivary origin. Recommend thyroid ultrasound and correlation with thyroglobulin levels.   Assessment & Plan:   1. Postsurgical hypothyroidism 2. 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  She is advised to continue levothyroxine 75 mcg and 50 mcg alternate day dose.  - We discussed about the correct intake of her thyroid hormone, on empty stomach at fasting, with water, separated by at least 30 minutes from breakfast and other medications,  and separated by more than 4 hours from calcium, iron, multivitamins, acid reflux medications (PPIs). -Patient is made aware of the fact that thyroid hormone replacement is needed for life, dose to be adjusted by periodic monitoring of thyroid function tests.   -She is status post I-131 thyroid remnant ablation with negative post therapy whole-body scan for distant metastasis.  -She has detectable thyroglobulin level 23, with undetectable thyroglobulin antibodies.   Previsit thyroid/neck ultrasound was also negative for residual/recurrent thyroid tissue no cervical lymphadenopathy on December 27, 2019.  -In light of her recent finding of nonspecific tracer uptake in the neck, she will be considered for neck/thyroid ultrasound before her next visit in 3 months.   Her physical exam today did not reveal any gross findings.  - I advised her  to maintain close follow up with Kennieth Rad, MD for primary care needs.      - Time spent on this patient care encounter:  30 minutes of which 50% was spent in  counseling and the rest reviewing  her current and  previous labs / studies and medications  doses and developing a plan for long term care, and documenting this care. Barbette Reichmann Belue  participated in the discussions, expressed understanding, and voiced agreement with the above plans.  All questions were answered to her satisfaction. she is encouraged to  contact clinic should she have any questions or concerns prior to her return visit.   Follow up plan: Return in about 3 months (around 05/19/2021) for Thyroid / Neck Ultrasound, F/U with Pre-visit Labs.   Glade Lloyd, MD [facility] [address]  [address] [place], [place] [address] Phone: [phone]  Fax: [phone]     02/19/2021, 3:13 PM  This note was partially dictated with voice recognition software. Similar sounding words can be transcribed inadequately or may not  be corrected upon review.

## 2021-02-25 ENCOUNTER — Encounter (INDEPENDENT_AMBULATORY_CARE_PROVIDER_SITE_OTHER): Payer: Self-pay | Admitting: *Deleted

## 2021-04-17 ENCOUNTER — Other Ambulatory Visit (INDEPENDENT_AMBULATORY_CARE_PROVIDER_SITE_OTHER): Payer: Self-pay | Admitting: *Deleted

## 2021-04-17 ENCOUNTER — Encounter (INDEPENDENT_AMBULATORY_CARE_PROVIDER_SITE_OTHER): Payer: Self-pay | Admitting: *Deleted

## 2021-04-18 ENCOUNTER — Telehealth (INDEPENDENT_AMBULATORY_CARE_PROVIDER_SITE_OTHER): Payer: Self-pay | Admitting: *Deleted

## 2021-04-18 ENCOUNTER — Other Ambulatory Visit (INDEPENDENT_AMBULATORY_CARE_PROVIDER_SITE_OTHER): Payer: Self-pay

## 2021-04-18 DIAGNOSIS — C211 Malignant neoplasm of anal canal: Secondary | ICD-10-CM

## 2021-04-18 NOTE — Telephone Encounter (Signed)
Referring MD/PCP: desai  Procedure: flex sig  Reason/Indication:  Hx rectal cancer  Has patient had this procedure before?  Yes, 2020 & 2021 for FS and 2019 for TCS  If so, when, by whom and where?    Is there a family history of colon cancer?  no  Who?  What age when diagnosed?    Is patient diabetic? If yes, Type 1 or Type 2   no      Does patient have prosthetic heart valve or mechanical valve?  no  Do you have a pacemaker/defibrillator?  no  Has patient ever had endocarditis/atrial fibrillation? no  Have you had a stroke/heart attack last 6 mths? no  Does patient use oxygen? no  Has patient had joint replacement within last 12 months?  no  Is patient constipated or do they take laxatives? no  Does patient have a history of alcohol/drug use?  no  Is patient on blood thinner such as Coumadin, Plavix and/or Aspirin? no  Do you take medicine for weight loss?  no  Medications: benazepril 10 mg daily, hctz 12.5 mg daily, amlodipine 2.5 mg daily, levothyroxine 50 mcg mon, wed & fri and 75 mcg tues, thurs, sat & sun   Allergies: nkda  Medication Adjustment per Dr Rehman/Dr Jenetta Downer   Procedure date & time: 05/08/21

## 2021-05-07 ENCOUNTER — Other Ambulatory Visit: Payer: Self-pay

## 2021-05-07 ENCOUNTER — Other Ambulatory Visit (HOSPITAL_COMMUNITY)
Admission: RE | Admit: 2021-05-07 | Discharge: 2021-05-07 | Disposition: A | Discharge status: Home / Self Care | Payer: Medicare Other | Source: Ambulatory Visit | Attending: Internal Medicine | Admitting: Internal Medicine

## 2021-05-07 DIAGNOSIS — Z01812 Encounter for preprocedural laboratory examination: Secondary | ICD-10-CM | POA: Diagnosis present

## 2021-05-07 DIAGNOSIS — Z20822 Contact with and (suspected) exposure to covid-19: Secondary | ICD-10-CM | POA: Diagnosis not present

## 2021-05-07 LAB — SARS CORONAVIRUS 2 (TAT 6-24 HRS): SARS Coronavirus 2: NEGATIVE

## 2021-05-08 ENCOUNTER — Other Ambulatory Visit: Payer: Self-pay

## 2021-05-08 ENCOUNTER — Encounter (HOSPITAL_COMMUNITY): Payer: Self-pay | Admitting: Internal Medicine

## 2021-05-08 ENCOUNTER — Encounter (HOSPITAL_COMMUNITY)
Admission: RE | Disposition: A | Discharge status: Home / Self Care | Payer: Self-pay | Source: Home / Self Care | Attending: Internal Medicine

## 2021-05-08 ENCOUNTER — Ambulatory Visit (HOSPITAL_COMMUNITY)
Admission: RE | Admit: 2021-05-08 | Discharge: 2021-05-08 | Disposition: A | Discharge status: Home / Self Care | Payer: Medicare Other | Attending: Internal Medicine | Admitting: Internal Medicine

## 2021-05-08 DIAGNOSIS — Z85048 Personal history of other malignant neoplasm of rectum, rectosigmoid junction, and anus: Secondary | ICD-10-CM | POA: Insufficient documentation

## 2021-05-08 DIAGNOSIS — K644 Residual hemorrhoidal skin tags: Secondary | ICD-10-CM | POA: Diagnosis not present

## 2021-05-08 DIAGNOSIS — Z85828 Personal history of other malignant neoplasm of skin: Secondary | ICD-10-CM | POA: Insufficient documentation

## 2021-05-08 DIAGNOSIS — Z9221 Personal history of antineoplastic chemotherapy: Secondary | ICD-10-CM | POA: Insufficient documentation

## 2021-05-08 DIAGNOSIS — Z08 Encounter for follow-up examination after completed treatment for malignant neoplasm: Secondary | ICD-10-CM | POA: Diagnosis present

## 2021-05-08 DIAGNOSIS — C211 Malignant neoplasm of anal canal: Secondary | ICD-10-CM

## 2021-05-08 DIAGNOSIS — K6289 Other specified diseases of anus and rectum: Secondary | ICD-10-CM | POA: Diagnosis not present

## 2021-05-08 DIAGNOSIS — Z79899 Other long term (current) drug therapy: Secondary | ICD-10-CM | POA: Insufficient documentation

## 2021-05-08 DIAGNOSIS — Z79890 Hormone replacement therapy (postmenopausal): Secondary | ICD-10-CM | POA: Insufficient documentation

## 2021-05-08 DIAGNOSIS — Z85850 Personal history of malignant neoplasm of thyroid: Secondary | ICD-10-CM | POA: Insufficient documentation

## 2021-05-08 HISTORY — PX: FLEXIBLE SIGMOIDOSCOPY: SHX5431

## 2021-05-08 SURGERY — SIGMOIDOSCOPY, FLEXIBLE
Anesthesia: Moderate Sedation

## 2021-05-08 MED ORDER — SODIUM CHLORIDE 0.9 % IV SOLN: INTRAVENOUS | Status: DC

## 2021-05-08 MED ORDER — MEPERIDINE HCL 25 MG/ML IJ SOLN
INTRAMUSCULAR | Status: DC | PRN
  Administered 2021-05-08 (×2): 25 mg via INTRAVENOUS

## 2021-05-08 MED ORDER — MEPERIDINE HCL 50 MG/ML IJ SOLN
INTRAMUSCULAR | Status: AC
  Filled 2021-05-08: qty 1

## 2021-05-08 MED ORDER — MIDAZOLAM HCL 5 MG/5ML IJ SOLN
INTRAMUSCULAR | Status: AC
  Filled 2021-05-08: qty 10

## 2021-05-08 MED ORDER — STERILE WATER FOR IRRIGATION IR SOLN
Status: DC | PRN
  Administered 2021-05-08: 1.5 mL

## 2021-05-08 MED ORDER — MIDAZOLAM HCL 5 MG/5ML IJ SOLN
INTRAMUSCULAR | Status: DC | PRN
  Administered 2021-05-08: 2 mg via INTRAVENOUS
  Administered 2021-05-08: 1 mg via INTRAVENOUS

## 2021-05-08 NOTE — Discharge Instructions (Signed)
Flexible Sigmoidoscopy Flexible sigmoidoscopy is a procedure to check the health of your lower colon (sigmoid colon). The procedure is done using a short, flexible tube, called a sigmoidoscope, that has a small camera with a light on the end. The sigmoidoscope is inserted into the opening between the buttocks (anus) and passed through the rectum into the sigmoid colon. The camera on the scope sends images of the sigmoid colon and rectum to a TV monitor in the exam room. You may need this procedure if you have or have had:  Changes in your bowel habits.  Blood in your stool.  Pain in your abdomen.  Unexplained weight loss. You may also need this test if your health care provider wants to check for abnormal growths in your rectum or sigmoid colon. Tell a health care provider about:  Any allergies you have.  All medicines you are taking, including vitamins, herbs, eye drops, creams, and over-the-counter medicines.  Any problems you or family members have had with anesthetic medicines.  Any blood disorders you have.  Any surgeries you have had.  Any medical conditions you have.  Whether you are pregnant or may be pregnant. What are the risks? Generally, this is a safe procedure. However, problems may occur, including:  Bleeding.  A tear (perforation) through your rectum or colon. This is rare.  Infection. This is rare. What happens before the procedure? Eating and drinking restrictions Follow instructions from your health care provider about eating or drinking restrictions. These instructions may include the following:  Starting 24 hours before the procedure: ? Follow a clear liquid diet. This diet may include:  Gelatin-based desserts.  Clear liquids, such as water, clear juice, clear broth or bouillon, clear soft drinks or sports drinks, or coffee or tea without milk or cream. ? Avoid drinking liquids that contain red or purple dye 24 hours before the procedure. ? Do not  eat solid foods.  In the 2 hours before the procedure, or within the time period that your health care provider recommends, do not eat or drink anything. Bowel prep You will need to do a bowel prep the night before or the morning of the procedure. Follow instructions exactly as given by your health care provider. This is important. The bowel prep empties the colon of stool so that your health care provider can have a clear view of your bowel during the procedure. The bowel prep may include one or more of the following:  An enema. This is liquid medicine injected into your rectum.  A suppository. This is a solid medicine inserted into your rectum.  Laxative medicines. These will help you have a bowel movement. Laxatives come in different forms and may be prescribed as: ? A pill that you swallow. ? A powder that you add to water and drink. ? A liquid laxative that you drink over a scheduled amount of time. Medicines Ask your health care provider about:  Changing or stopping your regular medicines. This is especially important if you are taking iron supplements, diabetes medicines, or blood thinners.  Taking medicines such as aspirin and ibuprofen. These medicines can thin your blood. Do not take these medicines unless your health care provider tells you to take them.  Taking over-the-counter medicines, vitamins, herbs, and supplements. General instructions  Ask your health care provider what steps will be taken to prevent infection.  If you will be given a medicine to help you relax (sedative): ? Plan to have someone take you home from the  hospital or clinic. ? Plan to have someone with you for 24 hours. What happens during the procedure?  An IV may be inserted into one of your veins.  You may be given a sedative.  You will lie on your side on the exam table. You may be asked to change positions during the procedure.  The sigmoidoscope will have oil or gel applied to it (will be  lubricated) and gently inserted into your anus.  Air will be injected into your colon, and the sigmoidoscope will be moved through your rectum and into your sigmoid colon. You may feel some pressure or cramping.  Images will appear on a monitor in the exam room. The images will be checked for any abnormal findings.  A small tissue sample may be removed to be looked at later under a microscope (biopsy).  If any small growths (polyps) are found, they may be removed and checked for cancer cells.  The scope will be slowly removed. The procedure may vary among health care providers and hospitals.   What happens after the procedure?  Your blood pressure, heart rate, breathing rate, and blood oxygen level will be monitored until you leave the hospital or clinic.  If you were given a sedative during the procedure, it can affect you for several hours. Do not drive or operate machinery until your health care provider says that it is safe.  It is up to you to get the results of your procedure. Ask your health care provider, or the department that is doing the procedure, when your results will be ready. Summary  A flexible sigmoidoscopy is a procedure that looks at the lower part of the colon, known as the sigmoid colon.  A flexible tube with a camera and light on the end is inserted into the opening between the buttocks, or anus, and then passed through the rectum into the sigmoid colon. The camera sends images of the sigmoid colon and rectum to a TV monitor.  Follow instructions from your health care provider about eating and drinking before the procedure.  Follow instructions from your health care provider for doing a bowel prep before the procedure. The bowel prep empties the colon of stool so that your health care provider can have a clear view of your bowel during the procedure. This information is not intended to replace advice given to you by your health care provider. Make sure you discuss  any questions you have with your health care provider. Document Revised: 12/11/2019 Document Reviewed: 12/11/2019 Elsevier Patient Education  2021 [employer]. Resume usual medications and diet as before. No driving for 24 hours. Yearly digital exam and flexible sigmoidoscopy in 3 years.

## 2021-05-08 NOTE — H&P (Signed)
Margaret Castaneda is an 65 y.o. female.   Chief Complaint: Patient is here for flexible sigmoidoscopy. HPI: Patient is 65 year old Caucasian female who is here for surveillance flexible sigmoidoscopy.  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  She was treated with chemotherapy.  She is under care of Dr. Delton Coombes of oncology service.  Patient remains in remission.  Last flexible sigmoidoscopy was in March 2021.  She denies abdominal pain diarrhea constipation or rectal bleeding.  She may have occasional discomfort she believes is due to hemorrhoids. Patient also gives history of squamous cell carcinoma of the skin.  She states she has had at least 40 lesions removed.  Past Medical History:  Diagnosis Date  . Cancer [facility])    Anal Cancer, Thyroid Cancer  . History of kidney stones   . Hypertension   . Neuropathy     05/18/2018    Past Surgical History:  Procedure Laterality Date  . COLONOSCOPY N/A 05/24/2018   Procedure: COLONOSCOPY;  Surgeon: Rogene Houston, MD;  [facility];  Service: Endoscopy;  Laterality: N/A;  7:30  . FLEXIBLE SIGMOIDOSCOPY N/A 03/02/2019   Procedure: FLEXIBLE SIGMOIDOSCOPY;  Surgeon: Rogene Houston, MD;  [facility];  Service: Endoscopy;  Laterality: N/A;  245  . FLEXIBLE SIGMOIDOSCOPY N/A 03/27/2020   Procedure: FLEXIBLE SIGMOIDOSCOPY;  Surgeon: Rogene Houston, MD;  [facility];  Service: Endoscopy;  Laterality: N/A;  1145  . reversal tubal ligation    . SIGMOIDOSCOPY  01/2019  . THYROIDECTOMY N/A 01/04/2019   Procedure: TOTAL THYROIDECTOMY;  Surgeon: Aviva Signs, MD;  Location: [facility];  Service: General;  Laterality: N/A;  . THYROIDECTOMY    . TONSILLECTOMY    . TUBAL LIGATION      Family History  Problem Relation Age of Onset  . Gastric cancer Paternal Grandfather   . Hypertension Mother   . Kidney cancer Father   . Stroke Father   . Heart attack Father   . Thyroid cancer  Sister   . Hypertension Brother   . Breast cancer Maternal Grandmother   . Colon cancer Neg Hx    Social History:  reports that she has never smoked. She has never used smokeless tobacco. She reports that she does not drink alcohol and does not use drugs.  Allergies: No Known Allergies  Medications Prior to Admission  Medication Sig Dispense Refill  . acetaminophen (TYLENOL) 500 MG tablet Take 1 tablet (500 mg total) by mouth every 6 (six) hours as needed for moderate pain or headache. 30 tablet 0  . amLODipine (NORVASC) 5 MG tablet Take 2.5 mg by mouth in the morning.  4  . benazepril (LOTENSIN) 10 MG tablet Take 10 mg by mouth in the morning.    Arna Medici 50 MCG tablet TAKE 1 TABLET BY MOUTH ONCE DAILY BEFORE BREAKFAST ON  MONDAY,WEDNESDAY,  AND  FRIDAY (Patient taking differently: Take 50 mcg by mouth every Monday, Wednesday, and Friday.) 90 tablet 1  . famotidine (PEPCID) 10 MG tablet Take 10 mg by mouth daily with lunch.    . hydrochlorothiazide (MICROZIDE) 12.5 MG capsule Take 12.5 mg by mouth in the morning.    Marland Kitchen ibuprofen (ADVIL) 200 MG tablet Take 400 mg by mouth every 8 (eight) hours as needed (pain).    Marland Kitchen levothyroxine (EUTHYROX) 75 MCG tablet TAKE ONE TABLET BY MOUTH IN THE MORNING EVERY TUESDAY, THURSDAY, SATURDAY AND SUNDAY (Patient taking differently:  Take 75 mcg by mouth 4 (four) times a week. TAKE ONE TABLET BY MOUTH IN THE MORNING EVERY TUESDAY, THURSDAY, SATURDAY AND SUNDAY) 90 tablet 1  . diphenhydrAMINE (BENADRYL) 25 MG tablet Take 25 mg by mouth every 6 (six) hours as needed for allergies.     Marland Kitchen docusate sodium (COLACE) 100 MG capsule Take 100 mg by mouth 2 (two) times daily as needed (constipation).    . meclizine (ANTIVERT) 25 MG tablet Take 25 mg by mouth 3 (three) times daily as needed for dizziness.      Results for orders placed or performed during the hospital encounter of 05/07/21 (from the past 48 hour(s))  SARS CORONAVIRUS 2 (TAT 6-24 HRS) Nasopharyngeal  Nasopharyngeal Swab     Status: None   Collection Time: 05/07/21  6:10 AM   Specimen: Nasopharyngeal Swab  Result Value Ref Range   SARS Coronavirus 2 NEGATIVE NEGATIVE    Comment: (NOTE) SARS-CoV-2 target nucleic acids are NOT DETECTED.  The SARS-CoV-2 RNA is generally detectable in upper and lower respiratory specimens during the acute phase of infection. Negative results do not preclude SARS-CoV-2 infection, do not rule out co-infections with other pathogens, and should not be used as the sole basis for treatment or other patient management decisions. Negative results must be combined with clinical observations, patient history, and epidemiological information. The expected result is Negative.  Fact Sheet for Patients: SugarRoll.be  Fact Sheet for Healthcare Providers: https://www.woods-mathews.com/  This test is not yet approved or cleared by the [place] FDA and  has been authorized for detection and/or diagnosis of SARS-CoV-2 by FDA under an Emergency Use Authorization (EUA). This EUA will remain  in effect (meaning this test can be used) for the duration of the COVID-19 declaration under Se ction 564(b)(1) of the Act, 21 U.S.C. section 360bbb-3(b)(1), unless the authorization is terminated or revoked sooner.  Performed at [facility], [place] [address] [place], [place] [address]    No results found.  Review of Systems  Blood pressure (!) 143/82, pulse 68, temperature 98 F (36.7 C), temperature source Oral, height 5\' 1"  (1.549 m), weight 54.4 kg, SpO2 95 %. Physical Exam HENT:     Mouth/Throat:     Mouth: Mucous membranes are moist.     Pharynx: Oropharynx is clear.  Eyes:     General: No scleral icterus.    Conjunctiva/sclera: Conjunctivae normal.  Cardiovascular:     Rate and Rhythm: Normal rate and regular rhythm.     Heart sounds: Normal heart sounds. No murmur heard.   Pulmonary:     Effort: Pulmonary  effort is normal.     Breath sounds: Normal breath sounds.  Abdominal:     General: Abdomen is flat. There is no distension.     Palpations: Abdomen is soft. There is no mass.     Tenderness: There is no abdominal tenderness.  Musculoskeletal:        General: No swelling.     Cervical back: Neck supple.  Lymphadenopathy:     Cervical: No cervical adenopathy.  Skin:    General: Skin is warm and dry.  Neurological:     Mental Status: She is alert.      Assessment/Plan  History of squamous cell carcinoma of the anal canal. Surveillance flexible sigmoidoscopy.  Hildred Laser, MD 05/08/2021, 7:27 AM

## 2021-05-08 NOTE — Op Note (Signed)
[facility] Patient Name: Margaret Castaneda Procedure Date: 05/08/2021 6:59 AM MRN: [id number] Date of Birth: 1956/09/22 Attending MD: Hildred Laser , MD CSN: [id number] Age: 65 Admit Type: Outpatient Procedure:                Flexible Sigmoidoscopy Indications:              High risk colon cancer surveillance: Personal                            history of anal cancer Providers:                Hildred Laser, MD, Caprice Kluver, Kristine L. Risa Grill, Technician Referring MD:             Kennieth Rad, MD and Derek Jack, MD Medicines:                Meperidine 50 mg IV, Midazolam 3 mg IV Complications:            No immediate complications. Estimated Blood Loss:     Estimated blood loss: none. Procedure:                Pre-Anesthesia Assessment:                           - Prior to the procedure, a History and Physical                            was performed, and patient medications and                            allergies were reviewed. The patient's tolerance of                            previous anesthesia was also reviewed. The risks                            and benefits of the procedure and the sedation                            options and risks were discussed with the patient.                            All questions were answered, and informed consent                            was obtained. Prior Anticoagulants: The patient has                            taken no previous anticoagulant or antiplatelet                            agents except for NSAID medication. ASA Grade  Assessment: II - A patient with mild systemic                            disease. After reviewing the risks and benefits,                            the patient was deemed in satisfactory condition to                            undergo the procedure.                           After obtaining informed consent, the scope was                             passed under direct vision. The [id number]-[id number]                            ([id number]) scope was introduced through the anus and                            advanced to the the sigmoid colon. The flexible                            sigmoidoscopy was accomplished without difficulty.                            The patient tolerated the procedure well. The                            quality of the bowel preparation was adequate. Scope In: 7:43:01 AM Scope Out: 7:47:45 AM Total Procedure Duration: 0 hours 4 minutes 44 seconds  Findings:      Skin tags were found on perianal exam.      The digital rectal exam was normal.      The mid sigmoid colon appeared normal.      The rectum, recto-sigmoid colon, mid sigmoid colon and distal sigmoid       colon appeared normal.      Anal papilla(e) were hypertrophied. Impression:               - Perianal skin tags found on perianal exam.                           - The mid sigmoid colon is normal.                           - The rectum, recto-sigmoid colon, mid sigmoid                            colon and distal sigmoid colon are normal.                           - Anal papilla(e) were hypertrophied.                           -  No specimens collected. Moderate Sedation:      Moderate (conscious) sedation was administered by the endoscopy nurse       and supervised by the endoscopist. The following parameters were       monitored: oxygen saturation, heart rate, blood pressure, CO2       capnography and response to care. Total physician intraservice time was       8 minutes. Recommendation:           - Discharge patient to home (with spouse).                           - Continue present medications.                           - Yearly digital rectal exam.                           - Repeat flexible sigmoidoscopy in 3 years for                            surveillance. Procedure Code(s):        --- Professional ---                           [phone],  Sigmoidoscopy, flexible; diagnostic,                            including collection of specimen(s) by brushing or                            washing, when performed (separate procedure) Diagnosis Code(s):        --- Professional ---                           E83.151, Personal history of other malignant                            neoplasm of rectum, rectosigmoid junction, and anus                           K62.89, Other specified diseases of anus and rectum                           K64.4, Residual hemorrhoidal skin tags CPT copyright 2019 American Medical Association. All rights reserved. The codes documented in this report are preliminary and upon coder review may  be revised to meet current compliance requirements. Hildred Laser, MD Hildred Laser, MD 05/08/2021 8:01:46 AM This report has been signed electronically. Number of Addenda: 0

## 2021-05-14 ENCOUNTER — Ambulatory Visit (HOSPITAL_COMMUNITY)
Admission: RE | Admit: 2021-05-14 | Discharge: 2021-05-14 | Disposition: A | Discharge status: Home / Self Care | Payer: Medicare Other | Source: Ambulatory Visit | Attending: "Endocrinology | Admitting: "Endocrinology

## 2021-05-14 ENCOUNTER — Other Ambulatory Visit (HOSPITAL_COMMUNITY)
Admission: RE | Admit: 2021-05-14 | Discharge: 2021-05-14 | Disposition: A | Discharge status: Home / Self Care | Payer: Medicare Other | Source: Ambulatory Visit | Attending: "Endocrinology | Admitting: "Endocrinology

## 2021-05-14 ENCOUNTER — Other Ambulatory Visit: Payer: Self-pay

## 2021-05-14 ENCOUNTER — Encounter (HOSPITAL_COMMUNITY): Payer: Self-pay | Admitting: Internal Medicine

## 2021-05-14 DIAGNOSIS — C73 Malignant neoplasm of thyroid gland: Secondary | ICD-10-CM | POA: Diagnosis present

## 2021-05-14 DIAGNOSIS — E890 Postprocedural hypothyroidism: Secondary | ICD-10-CM | POA: Insufficient documentation

## 2021-05-14 LAB — T4, FREE: Free T4: 1.04 ng/dL (ref 0.61–1.12)

## 2021-05-14 LAB — TSH: TSH: 2.404 u[IU]/mL (ref 0.350–4.500)

## 2021-05-15 LAB — THYROGLOBULIN ANTIBODY: Thyroglobulin Antibody: <1 IU/mL (ref 0.0–0.9)

## 2021-05-19 LAB — THYROGLOBULIN LEVEL: Thyroglobulin: <2 ng/mL

## 2021-05-21 ENCOUNTER — Encounter: Payer: Self-pay | Admitting: "Endocrinology

## 2021-05-21 ENCOUNTER — Other Ambulatory Visit: Payer: Self-pay

## 2021-05-21 ENCOUNTER — Ambulatory Visit (INDEPENDENT_AMBULATORY_CARE_PROVIDER_SITE_OTHER): Payer: Medicare Other | Admitting: "Endocrinology

## 2021-05-21 VITALS — BP 110/58 | HR 72 | Ht 61.0 in | Wt 121.8 lb

## 2021-05-21 DIAGNOSIS — E890 Postprocedural hypothyroidism: Secondary | ICD-10-CM | POA: Diagnosis not present

## 2021-05-21 DIAGNOSIS — C73 Malignant neoplasm of thyroid gland: Secondary | ICD-10-CM | POA: Diagnosis not present

## 2021-05-21 NOTE — Progress Notes (Signed)
05/21/2021, 4:55 PM         Endocrinology follow-up note    Subjective:    Patient ID: Margaret Castaneda, female    DOB: 07-25-1956, PCP Kennieth Rad, MD   Past Medical History:  Diagnosis Date  . Cancer [facility])    Anal Cancer, Thyroid Cancer  . History of kidney stones   . Hypertension   . Neuropathy   . Rectal mass 05/18/2018   Past Surgical History:  Procedure Laterality Date  . COLONOSCOPY N/A 05/24/2018   Procedure: COLONOSCOPY;  Surgeon: Rogene Houston, MD;  [facility];  Service: Endoscopy;  Laterality: N/A;  7:30  . FLEXIBLE SIGMOIDOSCOPY N/A 03/02/2019   Procedure: FLEXIBLE SIGMOIDOSCOPY;  Surgeon: Rogene Houston, MD;  [facility];  Service: Endoscopy;  Laterality: N/A;  245  . FLEXIBLE SIGMOIDOSCOPY N/A 03/27/2020   Procedure: FLEXIBLE SIGMOIDOSCOPY;  Surgeon: Rogene Houston, MD;  [facility];  Service: Endoscopy;  Laterality: N/A;  1145  . FLEXIBLE SIGMOIDOSCOPY N/A 05/08/2021   Procedure: FLEXIBLE SIGMOIDOSCOPY;  Surgeon: Rogene Houston, MD;  [facility];  Service: Endoscopy;  Laterality: N/A;  730  . reversal tubal ligation    . SIGMOIDOSCOPY  01/2019  . THYROIDECTOMY N/A 01/04/2019   Procedure: TOTAL THYROIDECTOMY;  Surgeon: Aviva Signs, MD;  Location: [facility];  Service: General;  Laterality: N/A;  . THYROIDECTOMY    . TONSILLECTOMY    . TUBAL LIGATION     Social History   Socioeconomic History  . Marital status: Married    Spouse name: Not on file  . Number of children: Not on file  . Years of education: Not on file  . Highest education level: Not on file  Occupational History  . Not on file  Tobacco Use  . Smoking status: Never Smoker  . Smokeless tobacco: Never Used  Vaping Use  . Vaping Use: Never used  Substance and Sexual Activity  . Alcohol use: Never  . Drug use: Never  . Sexual activity: Yes    Birth control/protection: Surgical   Other Topics Concern  . Not on file  Social History Narrative  . Not on file   Social Determinants of Health   Financial Resource Strain: Not on file  Food Insecurity: Not on file  Transportation Needs: Not on file  Physical Activity: Not on file  Stress: Not on file  Social Connections: Not on file   Outpatient Encounter Medications as of 05/21/2021  Medication Sig  . acetaminophen (TYLENOL) 500 MG tablet Take 1 tablet (500 mg total) by mouth every 6 (six) hours as needed for moderate pain or headache.  Marland Kitchen amLODipine (NORVASC) 5 MG tablet Take 2.5 mg by mouth in the morning.  . benazepril (LOTENSIN) 10 MG tablet Take 10 mg by mouth in the morning.  . diphenhydrAMINE (BENADRYL) 25 MG tablet Take 25 mg by mouth every 6 (six) hours as needed for allergies.   Marland Kitchen docusate sodium (COLACE) 100 MG capsule Take 100 mg by mouth 2 (two) times daily as needed (constipation).  Arna Medici 50 MCG tablet TAKE 1 TABLET BY MOUTH ONCE DAILY BEFORE BREAKFAST ON  MONDAY,WEDNESDAY,  AND  FRIDAY (Patient  taking differently: Take 50 mcg by mouth every Monday, Wednesday, and Friday.)  . famotidine (PEPCID) 10 MG tablet Take 10 mg by mouth daily with lunch.  . hydrochlorothiazide (MICROZIDE) 12.5 MG capsule Take 12.5 mg by mouth in the morning.  Marland Kitchen ibuprofen (ADVIL) 200 MG tablet Take 400 mg by mouth every 8 (eight) hours as needed (pain).  Marland Kitchen levothyroxine (EUTHYROX) 75 MCG tablet TAKE ONE TABLET BY MOUTH IN THE MORNING EVERY TUESDAY, THURSDAY, SATURDAY AND SUNDAY (Patient taking differently: Take 75 mcg by mouth 4 (four) times a week. TAKE ONE TABLET BY MOUTH IN THE MORNING EVERY TUESDAY, THURSDAY, SATURDAY AND SUNDAY)  . meclizine (ANTIVERT) 25 MG tablet Take 25 mg by mouth 3 (three) times daily as needed for dizziness.   No facility-administered encounter medications on file as of 05/21/2021.   ALLERGIES: No Known Allergies  VACCINATION STATUS: Immunization History  Administered Date(s) Administered  .  Influenza-Unspecified 11/01/2020    HPI Margaret Castaneda is 65 y.o. 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  -She has no new complaints today. She is currently on alternating does of levothyroxine 25mcg and 75 mcg po qam. 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  Subsequent biopsy of 0.7 cm nodule in the left lobe of the thyroid in November 2019 revealed papillary thyroid cancer.  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  -Her Thyrogen stimulated whole-body scan after thyroid remnant ablation was negative for distant metastasis.  Prior to her last visit,  thyroid/neck ultrasound was also negative for residual/recurrent thyroid tissue no cervical lymphadenopathy on December 27, 2019.  Her most recent Thyrogen stimulated whole-body scan on December 16, 2020 showed: Small focus of radio iodine accumulation in the RIGHT cervical region. This is faint and nonspecific; this could represent residual tracer within the RIGHT thyroid bed, subtle nodal disease, or mild physiologic accumulation of tracer in the esophagus from salivary origin.  Recommended subsequent thyroid ultrasound on May 14, 2021 did not show any thyroid remnant or recurrence.      -Her labs in March 2020 showed detectable thyroglobulin of 23 with undetectable thyroglobulin antibodies.  However her thyroglobulin on May 14, 2021 is undetectable with undetectable antithyroglobulin bodies. -Her previsit thyroid function tests are consistent with appropriate replacement with free T4 1.04, TSH 2.4.      -She is  currently on levothyroxine 75 mcg and 50 mcg on alternate days.    She reports compliance to her medication.  She feels better, has no new complaints today.  -She denies any prior exposure to neck radiation, has 1 sister with thyroid malignancy.  -She remains off of calcium.  Her most recent labs show calcium of 9.5 improving from 8.3  from immediately after her surgery. -She denies palpitations, heat intolerance, tremors.    Objective:    BP (!) 110/58   Pulse 72   Ht 5\' 1"  (1.549 m)   Wt 121 lb 12.8 oz (55.2 kg)   BMI 23.01 kg/m   Wt Readings from Last 3 Encounters:  05/21/21 121 lb 12.8 oz (55.2 kg)  05/08/21 120 lb (54.4 kg)  02/19/21 121  lb (54.9 kg)    Physical Exam   CMP     Component Value Date/Time   NA 140 11/13/2020 1507   K 3.5 11/13/2020 1507   CL 99 11/13/2020 1507   CO2 32 11/13/2020 1507   GLUCOSE 82 11/13/2020 1507   BUN 15 11/13/2020 1507   CREATININE 1.05 (H) 11/13/2020 1507   CREATININE 0.95 05/18/2018 1416   CALCIUM 9.5 11/13/2020 1507   PROT 7.1 11/13/2020 1507   ALBUMIN 4.3 11/13/2020 1507   AST 22 11/13/2020 1507   ALT 15 11/13/2020 1507   ALKPHOS 77 11/13/2020 1507   BILITOT 0.6 11/13/2020 1507   GFRNONAA 59 (L) 11/13/2020 1507   GFRAA >60 05/15/2020 1414    Lab Results  Component Value Date   TSH 2.404 05/14/2021   TSH 3.57 02/17/2021   TSH 330.657 (H) 12/13/2020   TSH 1.62 08/07/2020   TSH 0.834 05/15/2020   TSH 0.03 (A) 09/18/2019   TSH 0.052 (L) 06/01/2019   TSH 0.411 03/06/2019   TSH 238.909 (H) 02/24/2019   TSH 0.062 (L) 02/01/2019   FREET4 1.04 05/14/2021   FREET4 1.10 12/13/2020   FREET4 1.46 03/06/2019   FREET4 1.47 02/24/2019     Thyroid/neck ultrasound December 27, 2019 No regional cervical lymphadenopathy.  IMPRESSION: Post total thyroidectomy without evidence of residual or locally recurrent disease.   Most recent thyroid instability whole-body scan on December 16, 2020 IMPRESSION: Small focus of radio  iodine accumulation in the RIGHT cervical region.  This is faint and nonspecific; this could represent residual tracer within the RIGHT thyroid bed, subtle nodal disease, or mild physiologic accumulation of tracer in the esophagus from salivary origin. Recommend thyroid ultrasound and correlation with thyroglobulin Levels.  Recommended thyroid/neck ultrasound on May 14, 2021: No regional cervical lymphadenopathy.  IMPRESSION: Post total thyroidectomy without evidence of residual or locally recurrent disease.  Assessment & Plan:   1. 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  She is advised to continue levothyroxine 75 mcg and 50 mcg alternate day dose.  - We discussed about the correct intake of her thyroid hormone, on empty stomach at fasting, with water, separated by at least 30 minutes from breakfast and other medications,  and separated by more than 4 hours from calcium, iron, multivitamins, acid reflux medications (PPIs). -Patient is made aware of the fact that thyroid hormone replacement is needed for life, dose to be adjusted by periodic monitoring of thyroid function tests.   -She is status post I-131 thyroid remnant ablation with negative post therapy whole-body scan for distant metastasis.  -She has detectable thyroglobulin level 23, with undetectable thyroglobulin antibodies.   Previsit thyroid/neck ultrasound was also negative for residual/recurrent thyroid tissue no cervical lymphadenopathy on December 27, 2019.  -Her previsit thyroid/neck ultrasound is negative for thyroid remnant no recurrence.     Her physical exam today did not reveal any gross findings. She will be considered for Thyrogen stimulated whole-body scan in a year.  - I advised her  to maintain close follow up with Kennieth Rad, MD for primary care needs.    - I spent  31 minutes in the care of the patient today including review of labs from Thyroid Function, CMP, and other relevant labs ; imaging/biopsy records (current and previous including abstractions from other facilities); face-to-face time discussing  her lab results and symptoms, medications doses, her options of short and long term treatment based  on the latest standards of care / guidelines;   and documenting the encounter.  Margaret Castaneda  participated in the discussions, expressed understanding, and voiced agreement with the above plans.  All questions were answered to her satisfaction. she is encouraged to contact clinic should she have any questions or concerns prior to her return visit.    Follow up plan: Return in about 6 months (around 11/21/2021) for F/U with Pre-visit Labs.   Glade Lloyd, MD [facility] [address] [place], [place] [address] Phone: [phone]  Fax: [phone]     05/21/2021, 4:55 PM  This note was partially dictated with voice recognition software. Similar sounding words can be transcribed inadequately or may not  be corrected upon review.

## 2021-05-27 NOTE — Progress Notes (Signed)
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  PRIOR THERAPY: Concurrent chemoradiation with Xeloda and mitomycin from 07/05/2018 to 08/17/2018  NGS Results: not done  CURRENT THERAPY: surveillance  BRIEF ONCOLOGIC HISTORY:  Oncology History  Primary cancer of anal canal ([place])  06/02/2018 Initial Diagnosis   Primary cancer of anal canal ([place])   07/05/2018 - 08/01/2018 Chemotherapy   The patient had ondansetron (ZOFRAN) 8 mg in sodium chloride 0.9 % 50 mL IVPB, , Intravenous,  Once, 1 of 1 cycle Administration:  (07/05/2018),  (08/01/2018) mitoMYcin (MUTAMYCIN) chemo injection 15.5 mg, 10 mg/m2 = 15.5 mg, Intravenous,  Once, 1 of 1 cycle Administration: 15.5 mg (07/05/2018), 15.5 mg (08/01/2018)  for chemotherapy treatment.      CANCER STAGING: Cancer Staging No matching staging information was found for the patient.  INTERVAL HISTORY:  Margaret Castaneda, a 65 y.o. female, returns for routine follow-up of her squamous cell carcinoma of the anal canal. Margaret Castaneda was last seen on 11/13/2020.   Today she reports feeling good. She plans to retire in January 2023; she is currently working 3 days a week. She reports normal BM and denies diarrhea. She has occasional constipation but reports that it is managable and tolerable.    REVIEW OF SYSTEMS:  Review of Systems  Constitutional: Negative for appetite change and fatigue.  Gastrointestinal: Negative for constipation (occasional; mild) and diarrhea.  Neurological: Positive for numbness (hands and legs).  All other systems reviewed and are negative.   PAST MEDICAL/SURGICAL HISTORY:  Past Medical History:  Diagnosis Date  . Cancer [facility])    Anal Cancer, Thyroid Cancer  . History of kidney stones    . Hypertension   . Neuropathy   . Rectal mass 05/18/2018   Past Surgical History:  Procedure Laterality Date  . COLONOSCOPY N/A 05/24/2018   Procedure: COLONOSCOPY;  Surgeon: Margaret Houston, MD;  [facility];  Service: Endoscopy;  Laterality: N/A;  7:30  . FLEXIBLE SIGMOIDOSCOPY N/A 03/02/2019   Procedure: FLEXIBLE SIGMOIDOSCOPY;  Surgeon: Margaret Houston, MD;  Location: [facility];  Service: Endoscopy;  Laterality: N/A;  245  . FLEXIBLE SIGMOIDOSCOPY N/A 03/27/2020   Procedure: FLEXIBLE SIGMOIDOSCOPY;  Surgeon: Margaret Houston, MD;  [facility];  Service: Endoscopy;  Laterality: N/A;  1145  . FLEXIBLE SIGMOIDOSCOPY N/A 05/08/2021   Procedure: FLEXIBLE SIGMOIDOSCOPY;  Surgeon: Margaret Houston, MD;  [facility];  Service: Endoscopy;  Laterality: N/A;  730  . reversal tubal ligation    . SIGMOIDOSCOPY  01/2019  . THYROIDECTOMY N/A 01/04/2019   Procedure: TOTAL THYROIDECTOMY;  Surgeon: Margaret Signs, MD;  Location: [facility] OR[facility];  Service: General;  Laterality: N/A;  . THYROIDECTOMY    . TONSILLECTOMY    . TUBAL LIGATION      SOCIAL HISTORY:  Social History   Socioeconomic History  . Marital status: Married    Spouse name: Not on file  . Number of children: Not on file  . Years of education: Not on file  . Highest education level: Not on file  Occupational History  . Not on file  Tobacco Use  . Smoking status: Never Smoker  . Smokeless tobacco: Never Used  Vaping Use  . Vaping Use: Never  used  Substance and Sexual Activity  . Alcohol use: Never  . Drug use: Never  . Sexual activity: Yes    Birth control/protection: Surgical  Other Topics Concern  . Not on file  Social History Narrative  . Not on file   Social Determinants of Health   Financial Resource Strain: Not on file  Food Insecurity: Not on file  Transportation Needs: Not on file  Physical Activity: Not on file  Stress: Not on file  Social Connections: Not on file  Intimate  Partner Violence: Not on file    FAMILY HISTORY:  Family History  Problem Relation Age of Onset  . Gastric cancer Paternal Grandfather   . Hypertension Mother   . Kidney cancer Father   . Stroke Father   . Heart attack Father   . Thyroid cancer Sister   . Hypertension Brother   . Breast cancer Maternal Grandmother   . Colon cancer Neg Hx     CURRENT MEDICATIONS:  Current Outpatient Medications  Medication Sig Dispense Refill  . acetaminophen (TYLENOL) 500 MG tablet Take 1 tablet (500 mg total) by mouth every 6 (six) hours as needed for moderate pain or headache. 30 tablet 0  . amLODipine (NORVASC) 5 MG tablet Take 2.5 mg by mouth in the morning.  4  . benazepril (LOTENSIN) 10 MG tablet Take 10 mg by mouth in the morning.    . diphenhydrAMINE (BENADRYL) 25 MG tablet Take 25 mg by mouth every 6 (six) hours as needed for allergies.     Marland Kitchen docusate sodium (COLACE) 100 MG capsule Take 100 mg by mouth 2 (two) times daily as needed (constipation).    Arna Medici 50 MCG tablet TAKE 1 TABLET BY MOUTH ONCE DAILY BEFORE BREAKFAST ON  MONDAY,WEDNESDAY,  AND  FRIDAY (Patient taking differently: Take 50 mcg by mouth every Monday, Wednesday, and Friday.) 90 tablet 1  . famotidine (PEPCID) 10 MG tablet Take 10 mg by mouth daily with lunch.    . hydrochlorothiazide (MICROZIDE) 12.5 MG capsule Take 12.5 mg by mouth in the morning.    Marland Kitchen ibuprofen (ADVIL) 200 MG tablet Take 400 mg by mouth every 8 (eight) hours as needed (pain).    Marland Kitchen levothyroxine (EUTHYROX) 75 MCG tablet TAKE ONE TABLET BY MOUTH IN THE MORNING EVERY TUESDAY, THURSDAY, SATURDAY AND SUNDAY (Patient taking differently: Take 75 mcg by mouth 4 (four) times a week. TAKE ONE TABLET BY MOUTH IN THE MORNING EVERY TUESDAY, THURSDAY, SATURDAY AND SUNDAY) 90 tablet 1  . meclizine (ANTIVERT) 25 MG tablet Take 25 mg by mouth 3 (three) times daily as needed for dizziness.     No current facility-administered medications for this visit.    ALLERGIES:   No Known Allergies  PHYSICAL EXAM:  Performance status (ECOG): 0 - Asymptomatic  There were no vitals filed for this visit. Wt Readings from Last 3 Encounters:  05/21/21 121 lb 12.8 oz (55.2 kg)  05/08/21 120 lb (54.4 kg)  02/19/21 121 lb (54.9 kg)   Physical Exam Vitals reviewed.  Constitutional:      Appearance: Normal appearance.  Cardiovascular:     Rate and Rhythm: Normal rate and regular rhythm.     Pulses: Normal pulses.     Heart sounds: Normal heart sounds.  Pulmonary:     Effort: Pulmonary effort is normal.     Breath sounds: Normal breath sounds.  Abdominal:     Palpations: Abdomen is soft. There is no hepatomegaly, splenomegaly or mass.  Tenderness: There is no abdominal tenderness.  Lymphadenopathy:     Lower Body: No right inguinal adenopathy.  Neurological:     General: No focal deficit present.     Mental Status: She is alert and oriented to person, place, and time.  Psychiatric:        Mood and Affect: Mood normal.        Behavior: Behavior normal.      LABORATORY DATA:  I have reviewed the labs as listed.  CBC Latest Ref Rng & Units 11/13/2020 05/15/2020 10/12/2019  WBC 4.0 - 10.5 K/uL 6.4 5.6 5.9  Hemoglobin 12.0 - 15.0 g/dL 12.7 12.0 13.6  Hematocrit 36.0 - 46.0 % 39.4 36.9 41.6  Platelets 150 - 400 K/uL 287 237 279   CMP Latest Ref Rng & Units 11/13/2020 05/15/2020 10/12/2019  Glucose 70 - 99 mg/dL 82 92 99  BUN 8 - 23 mg/dL 15 15 11   Creatinine 0.44 - 1.00 mg/dL 1.05(H) 0.96 1.00  Sodium 135 - 145 mmol/L 140 141 141  Potassium 3.5 - 5.1 mmol/L 3.5 3.3(L) 4.4  Chloride 98 - 111 mmol/L 99 101 99  CO2 22 - 32 mmol/L 32 31 32  Calcium 8.9 - 10.3 mg/dL 9.5 9.0 9.8  Total Protein 6.5 - 8.1 g/dL 7.1 6.6 7.4  Total Bilirubin 0.3 - 1.2 mg/dL 0.6 0.6 0.8  Alkaline Phos 38 - 126 U/L 77 74 90  AST 15 - 41 U/L 22 20 22   ALT 0 - 44 U/L 15 14 16     DIAGNOSTIC IMAGING:  I have independently reviewed the scans and discussed with the patient. US  THYROID  Result Date: 05/14/2021 CLINICAL DATA:  Other. History of papillary thyroid cancer, post total thyroidectomy with postoperative radioactive iodine ablation EXAM: THYROID ULTRASOUND TECHNIQUE: Ultrasound examination of the thyroid gland and adjacent soft tissues was performed. COMPARISON:  12/27/2019; 06/13/2018 Ultrasound-guided left thyroid nodule fine-needle aspiration-11/16/2018; Nuclear medicine I 131 post therapy whole body scan - 03/06/2019 Thyrogen stimulated I 131 whole body scan-12/16/2020 FINDINGS: Isthmus: Surgically absent. There is no residual nodular soft tissue within the isthmic resection bed. Right lobe: Surgically absent. There is no residual nodular soft tissue within the right lobectomy resection bed. Left lobe: Surgically absent. There is no residual nodular soft tissue within the left lobectomy resection bed. _________________________________________________________ No regional cervical lymphadenopathy. IMPRESSION: Post total thyroidectomy without evidence of residual or locally recurrent disease. Electronically Signed   By: Sandi Mariscal M.D.   On: 05/14/2021 15:49     ASSESSMENT:  1. 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 -Flex sigmoidoscopy on 03/06/2019 showed normal sigmoid colon, descending colon and rectosigmoid junction. -CTAP on 10/12/2019 did not show any evidence of recurrent malignancy. Stable hepatic hypodensities likely cysts. -Flexible sigmoidoscopy on 03/27/2020 shows proximal rectum, rectosigmoid colon and sigmoid colon distal descending colon within normal limits. A few nonbleeding distal rectal angiodysplastic lesions. Anal papillae were hypertrophic. Physical exam today did not reveal any palpable inguinal lymph nodes. - Flexible sigmoidoscopy on 05/08/2021 with perianal skin tags, mid sigmoid colon normal, rectum, rectosigmoid colon, distal sigmoid colon normal. -We will see  her back in 6 months for follow-up. She will have surveillance visits with DRE every 3 to 6 months for 5 years with inguinal lymph node palpation. Sigmoidoscopy every 6 to 12 months for 3 years.  2. Right breast lump: -CT scan showed incidental right breast lump. Patient reports she is aware of this  lump for 25 years. A needle biopsy at that time was benign. Serial mammograms did not show any growth.  3. Papillary thyroid cancer: -Total thyroidectomy on 01/04/2019 which showed at least 2 foci, both in the left lobe measuring 0.6 and 0.1 cm. 0.6 cm lesion is the classic form of papillary thyroid carcinoma. 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 No lymph nodes were examined. PT1AP NX. No lymphovascular invasion. Margins negative. -She is status post iodine-131 thyroid remnant ablation. -She is on Synthroid 50 mcg alternating with 75 mcg and follows up with Dr. Dorris Fetch.   PLAN:  1. Stage II (T2N0) squamous cell carcinoma the anal canal: -We have reviewed sigmoidoscopy results from 05/08/2021 which was grossly unchanged.  She will have repeat sigmoidoscopy in 3 years by Dr. Laural Golden. - Bilateral inguinal lymph node palpation was negative. - Reviewed labs today which showed mild hypokalemia of 3.1.  Slightly elevated creatinine is stable.  Mildly low hemoglobin of 11.7. - She was told to come back in 6 months for follow-up at which time we will do digital rectal exam.   Orders placed this encounter:  No orders of the defined types were placed in this encounter.    Derek Jack, MD [facility] [phone]   I, Thana Ates, am acting as a scribe for Dr. Derek Jack.  I, Derek Jack MD, have reviewed the above documentation for accuracy and completeness, and I agree with the above.

## 2021-05-28 ENCOUNTER — Inpatient Hospital Stay (HOSPITAL_COMMUNITY): Payer: Medicare Other | Attending: Hematology | Admitting: Hematology

## 2021-05-28 ENCOUNTER — Inpatient Hospital Stay (HOSPITAL_COMMUNITY): Payer: Medicare Other

## 2021-05-28 ENCOUNTER — Other Ambulatory Visit: Payer: Self-pay

## 2021-05-28 VITALS — BP 116/69 | HR 72 | Temp 97.1°F | Resp 18 | Wt 121.6 lb

## 2021-05-28 DIAGNOSIS — C73 Malignant neoplasm of thyroid gland: Secondary | ICD-10-CM | POA: Insufficient documentation

## 2021-05-28 DIAGNOSIS — C211 Malignant neoplasm of anal canal: Secondary | ICD-10-CM

## 2021-05-28 LAB — COMPREHENSIVE METABOLIC PANEL
ALT: 19 U/L (ref 0–44)
AST: 27 U/L (ref 15–41)
Albumin: 3.9 g/dL (ref 3.5–5.0)
Alkaline Phosphatase: 62 U/L (ref 38–126)
Anion gap: 6 (ref 5–15)
BUN: 14 mg/dL (ref 8–23)
CO2: 33 mmol/L — ABNORMAL HIGH (ref 22–32)
Calcium: 8.9 mg/dL (ref 8.9–10.3)
Chloride: 101 mmol/L (ref 98–111)
Creatinine, Ser: 1.05 mg/dL — ABNORMAL HIGH (ref 0.44–1.00)
GFR, Estimated: 59 mL/min — ABNORMAL LOW (ref >60)
Glucose, Bld: 93 mg/dL (ref 70–99)
Potassium: 3.1 mmol/L — ABNORMAL LOW (ref 3.5–5.1)
Sodium: 140 mmol/L (ref 135–145)
Total Bilirubin: 0.5 mg/dL (ref 0.3–1.2)
Total Protein: 6.5 g/dL (ref 6.5–8.1)

## 2021-05-28 LAB — CBC WITH DIFFERENTIAL/PLATELET
Abs Immature Granulocytes: 0.02 10*3/uL (ref 0.00–0.07)
Basophils Absolute: 0.1 10*3/uL (ref 0.0–0.1)
Basophils Relative: 1 %
Eosinophils Absolute: 0.1 10*3/uL (ref 0.0–0.5)
Eosinophils Relative: 2 %
HCT: 35.3 % — ABNORMAL LOW (ref 36.0–46.0)
Hemoglobin: 11.7 g/dL — ABNORMAL LOW (ref 12.0–15.0)
Immature Granulocytes: 0 %
Lymphocytes Relative: 28 %
Lymphs Abs: 1.7 10*3/uL (ref 0.7–4.0)
MCH: 30.7 pg (ref 26.0–34.0)
MCHC: 33.1 g/dL (ref 30.0–36.0)
MCV: 92.7 fL (ref 80.0–100.0)
Monocytes Absolute: 0.5 10*3/uL (ref 0.1–1.0)
Monocytes Relative: 9 %
Neutro Abs: 3.5 10*3/uL (ref 1.7–7.7)
Neutrophils Relative %: 60 %
Platelets: 261 10*3/uL (ref 150–400)
RBC: 3.81 MIL/uL — ABNORMAL LOW (ref 3.87–5.11)
RDW: 13.3 % (ref 11.5–15.5)
WBC: 6 10*3/uL (ref 4.0–10.5)
nRBC: 0 % (ref 0.0–0.2)

## 2021-05-28 NOTE — Patient Instructions (Signed)
[facility] at [facility] Discharge Instructions  You were seen today by Dr. Katragadda. He went over your recent results. Dr. Katragadda will see you back in 6 months for labs and follow up.   Thank you for choosing [facility] at [facility] to provide your oncology and hematology care.  To afford each patient quality time with our provider, please arrive at least 15 minutes before your scheduled appointment time.   If you have a lab appointment with the Cancer Center please come in thru the Main Entrance and check in at the main information desk  You need to re-schedule your appointment should you arrive 10 or more minutes late.  We strive to give you quality time with our providers, and arriving late affects you and other patients whose appointments are after yours.  Also, if you no show three or more times for appointments you may be dismissed from the clinic at the providers discretion.     Again, thank you for choosing [facility].  Our hope is that these requests will decrease the amount of time that you wait before being seen by our physicians.       _____________________________________________________________  Should you have questions after your visit to  Cancer Center, please contact our office at [phone] between the hours of 8:00 a.m. and 4:30 p.m.  Voicemails left after 4:00 p.m. will not be returned until the following business day.  For prescription refill requests, have your pharmacy contact our office and allow 72 hours.    Cancer Center Support Programs:   > Cancer Support Group  2nd Tuesday of the month 1pm-2pm, Journey Room    

## 2021-10-31 ENCOUNTER — Other Ambulatory Visit: Payer: Self-pay

## 2021-10-31 MED ORDER — LEVOTHYROXINE SODIUM 75 MCG PO TABS: ORAL_TABLET | ORAL | 1 refills | Status: DC

## 2021-11-17 LAB — TSH: TSH: 2.55 (ref <=5.90)

## 2021-11-24 ENCOUNTER — Other Ambulatory Visit: Payer: Self-pay

## 2021-11-24 ENCOUNTER — Encounter: Payer: Self-pay | Admitting: "Endocrinology

## 2021-11-24 ENCOUNTER — Ambulatory Visit (INDEPENDENT_AMBULATORY_CARE_PROVIDER_SITE_OTHER): Payer: Medicare Other | Admitting: "Endocrinology

## 2021-11-24 VITALS — BP 116/66 | HR 68 | Ht 61.0 in | Wt 120.8 lb

## 2021-11-24 DIAGNOSIS — E890 Postprocedural hypothyroidism: Secondary | ICD-10-CM | POA: Diagnosis not present

## 2021-11-24 DIAGNOSIS — C73 Malignant neoplasm of thyroid gland: Secondary | ICD-10-CM

## 2021-11-24 MED ORDER — LEVOTHYROXINE SODIUM 75 MCG PO TABS: 75.0000 ug | ORAL_TABLET | Freq: Every day | ORAL | 1 refills | Status: DC

## 2021-11-24 NOTE — Progress Notes (Signed)
11/24/2021, 4:04 PM         Endocrinology follow-up note   Subjective:    Patient ID: Margaret Castaneda, female    DOB: [birth year], PCP Kennieth Rad, MD   Past Medical History:  Diagnosis Date   Cancer [facility])    Anal Cancer, Thyroid Cancer   History of kidney stones    Hypertension    Neuropathy    Rectal mass 05/18/2018   Past Surgical History:  Procedure Laterality Date   COLONOSCOPY N/A 05/24/2018   Procedure: COLONOSCOPY;  Surgeon: Rogene Houston, MD;  [facility];  Service: Endoscopy;  Laterality: N/A;  7:30   FLEXIBLE SIGMOIDOSCOPY N/A 03/02/2019   Procedure: FLEXIBLE SIGMOIDOSCOPY;  Surgeon: Rogene Houston, MD;  [facility];  Service: Endoscopy;  Laterality: N/A;  [place] N/A 03/27/2020   Procedure: FLEXIBLE SIGMOIDOSCOPY;  Surgeon: Rogene Houston, MD;  Location: [facility];  Service: Endoscopy;  Laterality: N/A;  [place] N/A 05/08/2021   Procedure: FLEXIBLE SIGMOIDOSCOPY;  Surgeon: Rogene Houston, MD;  [facility];  Service: Endoscopy;  Laterality: N/A;  63   reversal tubal ligation     SIGMOIDOSCOPY  01/2019   THYROIDECTOMY N/A 01/04/2019   Procedure: TOTAL THYROIDECTOMY;  Surgeon: Aviva Signs, MD;  Location: [facility];  Service: General;  Laterality: N/A;   THYROIDECTOMY     TONSILLECTOMY     TUBAL LIGATION     Social History   Socioeconomic History   Marital status: Married    Spouse name: Not on file   Number of children: Not on file   Years of education: Not on file   Highest education level: Not on file  Occupational History   Not on file  Tobacco Use   Smoking status: Never   Smokeless tobacco: Never  Vaping Use   Vaping Use: Never used  Substance and Sexual Activity   Alcohol use: Never   Drug use: Never   Sexual activity: Yes    Birth control/protection: Surgical  Other Topics Concern   Not on file   Social History Narrative   Not on file   Social Determinants of Health   Financial Resource Strain: Not on file  Food Insecurity: Not on file  Transportation Needs: Not on file  Physical Activity: Not on file  Stress: Not on file  Social Connections: Not on file   Outpatient Encounter Medications as of 11/24/2021  Medication Sig   acetaminophen (TYLENOL) 500 MG tablet Take 1 tablet (500 mg total) by mouth every 6 (six) hours as needed for moderate pain or headache.   amLODipine (NORVASC) 5 MG tablet Take 2.5 mg by mouth in the morning.   benazepril (LOTENSIN) 10 MG tablet Take 10 mg by mouth in the morning.   diphenhydrAMINE (BENADRYL) 25 MG tablet Take 25 mg by mouth every 6 (six) hours as needed for allergies.    docusate sodium (COLACE) 100 MG capsule Take 100 mg by mouth 2 (two) times daily as needed (constipation).   famotidine (PEPCID) 10 MG tablet Take 10 mg by mouth daily with lunch.   hydrochlorothiazide (MICROZIDE) 12.5 MG capsule Take 12.5 mg by  mouth in the morning.   ibuprofen (ADVIL) 200 MG tablet Take 400 mg by mouth every 8 (eight) hours as needed (pain).   levothyroxine (EUTHYROX) 75 MCG tablet Take 1 tablet (75 mcg total) by mouth daily before breakfast.   meclizine (ANTIVERT) 25 MG tablet Take 25 mg by mouth 3 (three) times daily as needed for dizziness.   [DISCONTINUED] EUTHYROX 50 MCG tablet TAKE 1 TABLET BY MOUTH ONCE DAILY BEFORE BREAKFAST ON  MONDAY,WEDNESDAY,  AND  FRIDAY (Patient taking differently: Take 50 mcg by mouth every Monday, Wednesday, and Friday.)   [DISCONTINUED] levothyroxine (EUTHYROX) 75 MCG tablet TAKE ONE TABLET BY MOUTH IN THE MORNING EVERY TUESDAY, THURSDAY, SATURDAY AND SUNDAY   No facility-administered encounter medications on file as of 11/24/2021.   ALLERGIES: No Known Allergies  VACCINATION STATUS: Immunization History  Administered Date(s) Administered   Influenza-Unspecified 11/01/2020    HPI Margaret Castaneda is 65 y.o.  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  -She has no new complaints today. She is currently on alternating does of levothyroxine 41mcg and 75 mcg po qam. 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  Subsequent biopsy of 0.7 cm nodule in the left lobe of the thyroid in November 2019 revealed papillary thyroid cancer.  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  -Her Thyrogen stimulated whole-body scan after thyroid remnant ablation was negative for distant metastasis.  Prior to her last visit,  thyroid/neck ultrasound was also negative for residual/recurrent thyroid tissue no cervical lymphadenopathy on December 27, 2019.  Her most recent Thyrogen stimulated whole-body scan on December 16, 2020 showed: Small focus of radio iodine accumulation in the RIGHT cervical region.  This is faint and nonspecific; this could represent residual tracer within the RIGHT thyroid bed, subtle nodal disease, or mild physiologic accumulation of tracer in the esophagus from salivary origin.   Recommended subsequent thyroid ultrasound on May 14, 2021 did not show any thyroid remnant or recurrence.      -Her labs in March 2020 showed detectable thyroglobulin of 23 with undetectable thyroglobulin antibodies.  However her thyroglobulin on May 14, 2021 is undetectable with undetectable antithyroglobulin bodies. -Her previsit thyroid function tests are consistent with appropriate replacement with free T4 1.04, TSH 2.4.      -She is currently on levothyroxine 75 mcg and 50 mcg on alternate days.    She  reports compliance to her medication.  She feels better, has no new complaints today.  -She denies any prior exposure to neck radiation, has 1 sister with thyroid malignancy.  -She remains off of calcium.  Her most recent labs show calcium of 9.5 improving from 8.3  from immediately after her surgery. -She denies palpitations, heat intolerance, tremors.    Objective:    BP 116/66   Pulse 68   Ht 5\' 1"  (1.549 m)   Wt 120 lb 12.8 oz (54.8 kg)   BMI 22.82 kg/m   Wt Readings from Last 3 Encounters:  11/24/21 120 lb 12.8 oz (54.8 kg)  05/28/21 121 lb 9.6 oz (55.2 kg)  05/21/21 121 lb 12.8 oz (55.2 kg)  Physical Exam   CMP     Component Value Date/Time   NA 140 05/28/2021 1504   K 3.1 (L) 05/28/2021 1504   CL 101 05/28/2021 1504   CO2 33 (H) 05/28/2021 1504   GLUCOSE 93 05/28/2021 1504   BUN 14 05/28/2021 1504   CREATININE 1.05 (H) 05/28/2021 1504   CREATININE 0.95 05/18/2018 1416   CALCIUM 8.9 05/28/2021 1504   PROT 6.5 05/28/2021 1504   ALBUMIN 3.9 05/28/2021 1504   AST 27 05/28/2021 1504   ALT 19 05/28/2021 1504   ALKPHOS 62 05/28/2021 1504   BILITOT 0.5 05/28/2021 1504   GFRNONAA 59 (L) 05/28/2021 1504   GFRAA >60 05/15/2020 1414    Lab Results  Component Value Date   TSH 2.55 11/17/2021   TSH 2.404 05/14/2021   TSH 3.57 02/17/2021   TSH 330.657 (H) 12/13/2020   TSH 1.62 08/07/2020   TSH 0.834 05/15/2020   TSH 0.03 (A) 09/18/2019   TSH 0.052 (L) 06/01/2019   TSH 0.411 03/06/2019   TSH 238.909 (H) 02/24/2019   FREET4 1.04 05/14/2021   FREET4 1.10 12/13/2020   FREET4 1.46 03/06/2019   FREET4 1.47 02/24/2019     Thyroid/neck ultrasound December 27, 2019 No regional cervical lymphadenopathy.   IMPRESSION: Post total thyroidectomy without evidence of residual or locally recurrent disease.    Most recent thyroid instability whole-body scan on December 16, 2020 IMPRESSION: Small focus of radio iodine accumulation in the RIGHT cervical region.    This is faint and nonspecific; this could represent residual tracer within the RIGHT thyroid bed, subtle nodal disease, or mild physiologic accumulation of tracer in the esophagus from salivary origin.  Recommend thyroid ultrasound and correlation with thyroglobulin Levels.  Recommended thyroid/neck ultrasound on May 14, 2021: No regional cervical lymphadenopathy.   IMPRESSION: Post total thyroidectomy without evidence of residual or locally recurrent disease.  Assessment & Plan:   1. Postsurgical hypothyroidism 2. Papillary carcinoma of thyroid ([place]) -Her previsit thyroid function tests are consistent with appropriate replacement with levothyroxine, however she would benefit from daily consistent dose of levothyroxine 75 mcg.  -I advised her to discontinue the levothyroxine 50 mcg for now.  - We discussed about the correct intake of her thyroid hormone, on empty stomach at fasting, with water, separated by at least 30 minutes from breakfast and other medications,  and separated by more than 4 hours from calcium, iron, multivitamins, acid reflux medications (PPIs). -Patient is made aware of the fact that thyroid hormone replacement is needed for life, dose to be adjusted by periodic monitoring of thyroid function tests.  -She is status post I-131 thyroid remnant ablation with negative post therapy whole-body scan for distant metastasis.  -She has detectable thyroglobulin level 23, with undetectable thyroglobulin antibodies.   Previsit thyroid/neck ultrasound was also negative for residual/recurrent thyroid tissue no cervical lymphadenopathy on December 27, 2019.  -Her previsit thyroid/neck ultrasound is negative for thyroid remnant no recurrence.     Her physical exam today did not reveal any gross findings. She will be considered for Thyrogen stimulated whole-body scan before her next visit in 6 months.   - I advised her  to maintain close follow up with Kennieth Rad, MD for  primary care needs.   I spent 21 minutes in the care of the patient today including review of labs from Thyroid Function, CMP, and other relevant labs ; imaging/biopsy records (current and previous including abstractions from other facilities); face-to-face time discussing  her lab results and symptoms,  medications doses, her options of short and long term treatment based on the latest standards of care / guidelines;   and documenting the encounter.  Barbette Reichmann Rea  participated in the discussions, expressed understanding, and voiced agreement with the above plans.  All questions were answered to her satisfaction. she is encouraged to contact clinic should she have any questions or concerns prior to her return visit.   Follow up plan: Return in about 6 months (around 05/24/2022) for F/U with Pre-visit Labs, F/U with Whole Body Scan w/Thyrogen.   Glade Lloyd, MD [facility] [address] [place], [place] [address] Phone: [phone]  Fax: [phone]     11/24/2021, 4:04 PM  This note was partially dictated with voice recognition software. Similar sounding words can be transcribed inadequately or may not  be corrected upon review.

## 2021-11-26 ENCOUNTER — Ambulatory Visit: Payer: Medicare Other | Admitting: "Endocrinology

## 2021-12-10 ENCOUNTER — Other Ambulatory Visit (HOSPITAL_COMMUNITY): Payer: Medicare Other

## 2021-12-10 ENCOUNTER — Ambulatory Visit (HOSPITAL_COMMUNITY): Payer: Medicare Other | Admitting: Hematology

## 2021-12-14 NOTE — Progress Notes (Signed)
Margaret Castaneda, Margaret Castaneda   CLINIC:  Medical Oncology/Hematology  PCP:  Kennieth Rad, MD [facility] [address] 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)  06/02/2018 Initial Diagnosis   Primary cancer of anal canal ([place])   07/05/2018 - 08/01/2018 Chemotherapy   The patient had ondansetron (ZOFRAN) 8 mg in sodium chloride 0.9 % 50 mL IVPB, , Intravenous,  Once, 1 of 1 cycle Administration:  (07/05/2018),  (08/01/2018) mitoMYcin (MUTAMYCIN) chemo injection 15.5 mg, 10 mg/m2 = 15.5 mg, Intravenous,  Once, 1 of 1 cycle Administration: 15.5 mg (07/05/2018), 15.5 mg (08/01/2018)   for chemotherapy treatment.       CANCER STAGING:  Cancer Staging  No matching staging information was found for the patient.  INTERVAL HISTORY:  [name], a 65 y.o. female, returns for routine follow-up of her squamous cell carcinoma of the anal canal. Margaret Castaneda was last seen on 05/28/2021.   Today she reports feeling good. She reports stable urinary incontinence and neuropathy in her legs bilaterally.   REVIEW OF SYSTEMS:  Review of Systems  Constitutional:  Negative for appetite change and fatigue (75%).  Genitourinary:  Positive for bladder incontinence (stable).   Neurological:  Positive for numbness (stable neuropathy in legs).  All other systems reviewed and are negative.  PAST MEDICAL/SURGICAL HISTORY:  Past Medical History:  Diagnosis Date   Cancer [facility])    Anal Cancer, Thyroid Cancer   History of kidney stones    Hypertension    Neuropathy    Rectal mass 05/18/2018   Past Surgical History:  Procedure Laterality Date    COLONOSCOPY N/A 05/24/2018   Procedure: COLONOSCOPY;  Surgeon: Rogene Houston, MD;  Location: [facility];  Service: Endoscopy;  Laterality: N/A;  7:30   FLEXIBLE SIGMOIDOSCOPY N/A 03/02/2019   Procedure: FLEXIBLE SIGMOIDOSCOPY;  Surgeon: Rogene Houston, MD;  Location: [facility];  Service: Endoscopy;  Laterality: N/A;  [place] N/A 03/27/2020   Procedure: FLEXIBLE SIGMOIDOSCOPY;  Surgeon: Rogene Houston, MD;  Location: [facility];  Service: Endoscopy;  Laterality: N/A;  [place] N/A 05/08/2021   Procedure: FLEXIBLE SIGMOIDOSCOPY;  Surgeon: Rogene Houston, MD;  Location: [facility];  Service: Endoscopy;  Laterality: N/A;  730   reversal tubal ligation     SIGMOIDOSCOPY  01/2019   THYROIDECTOMY N/A 01/04/2019   Procedure: TOTAL THYROIDECTOMY;  Surgeon: Aviva Signs, MD;  Location: [facility] ORS;  Service: General;  Laterality: N/A;   THYROIDECTOMY     TONSILLECTOMY     TUBAL LIGATION      SOCIAL HISTORY:  Social History   Socioeconomic History   Marital status: Married    Spouse name: Not on file   Number of children: Not on file   Years of education: Not on file   Highest education level: Not on file  Occupational History   Not on file  Tobacco Use   Smoking status: Never   Smokeless tobacco: Never  Vaping Use   Vaping Use: Never used  Substance and Sexual Activity   Alcohol use: Never   Drug use: Never   Sexual  activity: Yes    Birth control/protection: Surgical  Other Topics Concern   Not on file  Social History Narrative   Not on file   Social Determinants of Health   Financial Resource Strain: Not on file  Food Insecurity: Not on file  Transportation Needs: Not on file  Physical Activity: Not on file  Stress: Not on file  Social Connections: Not on file  Intimate Partner Violence: Not on file    FAMILY HISTORY:  Family History  Problem Relation Age of Onset   Gastric cancer Paternal Grandfather     Hypertension Mother    Kidney cancer Father    Stroke Father    Heart attack Father    Thyroid cancer Sister    Hypertension Brother    Breast cancer Maternal Grandmother    Colon cancer Neg Hx     CURRENT MEDICATIONS:  Current Outpatient Medications  Medication Sig Dispense Refill   acetaminophen (TYLENOL) 500 MG tablet Take 1 tablet (500 mg total) by mouth every 6 (six) hours as needed for moderate pain or headache. 30 tablet 0   amLODipine (NORVASC) 5 MG tablet Take 2.5 mg by mouth in the morning.  4   benazepril (LOTENSIN) 10 MG tablet Take 10 mg by mouth in the morning.     diphenhydrAMINE (BENADRYL) 25 MG tablet Take 25 mg by mouth every 6 (six) hours as needed for allergies.      docusate sodium (COLACE) 100 MG capsule Take 100 mg by mouth 2 (two) times daily as needed (constipation).     famotidine (PEPCID) 10 MG tablet Take 10 mg by mouth daily with lunch.     hydrochlorothiazide (MICROZIDE) 12.5 MG capsule Take 12.5 mg by mouth in the morning.     ibuprofen (ADVIL) 200 MG tablet Take 400 mg by mouth every 8 (eight) hours as needed (pain).     levothyroxine (EUTHYROX) 75 MCG tablet Take 1 tablet (75 mcg total) by mouth daily before breakfast. 90 tablet 1   meclizine (ANTIVERT) 25 MG tablet Take 25 mg by mouth 3 (three) times daily as needed for dizziness.     No current facility-administered medications for this visit.    ALLERGIES:  No Known Allergies  PHYSICAL EXAM:  Performance status (ECOG): 0 - Asymptomatic  Vitals:   12/15/21 1429  BP: 129/76  Pulse: 72  Resp: 20  Temp: 98.1 F (36.7 C)  SpO2: 98%   Wt Readings from Last 3 Encounters:  12/15/21 121 lb 4.1 oz (55 kg)  11/24/21 120 lb 12.8 oz (54.8 kg)  05/28/21 121 lb 9.6 oz (55.2 kg)   Physical Exam Vitals reviewed.  Constitutional:      Appearance: Normal appearance.  Cardiovascular:     Rate and Rhythm: Normal rate and regular rhythm.     Pulses: Normal pulses.     Heart sounds: Normal heart  sounds.  Pulmonary:     Effort: Pulmonary effort is normal.     Breath sounds: Normal breath sounds.  Abdominal:     Palpations: Abdomen is soft. There is no hepatomegaly, splenomegaly or mass.     Tenderness: There is no abdominal tenderness.  Genitourinary:    Rectum: Normal. No mass.  Lymphadenopathy:     Lower Body: No right inguinal adenopathy. No left inguinal adenopathy.  Neurological:     General: No focal deficit present.     Mental Status: She is alert and oriented to person, place, and time.  Psychiatric:  Mood and Affect: Mood normal.        Behavior: Behavior normal.     LABORATORY DATA:  I have reviewed the labs as listed.  CBC Latest Ref Rng & Units 12/15/2021 05/28/2021 11/13/2020  WBC 4.0 - 10.5 K/uL 6.8 6.0 6.4  Hemoglobin 12.0 - 15.0 g/dL 12.0 11.7(L) 12.7  Hematocrit 36.0 - 46.0 % 36.7 35.3(L) 39.4  Platelets 150 - 400 K/uL 351 261 287   CMP Latest Ref Rng & Units 12/15/2021 05/28/2021 11/13/2020  Glucose 70 - 99 mg/dL 104(H) 93 82  BUN 8 - 23 mg/dL 19 14 15   Creatinine 0.44 - 1.00 mg/dL 1.01(H) 1.05(H) 1.05(H)  Sodium 135 - 145 mmol/L 140 140 140  Potassium 3.5 - 5.1 mmol/L 3.4(L) 3.1(L) 3.5  Chloride 98 - 111 mmol/L 100 101 99  CO2 22 - 32 mmol/L 32 33(H) 32  Calcium 8.9 - 10.3 mg/dL 9.0 8.9 9.5  Total Protein 6.5 - 8.1 g/dL 6.9 6.5 7.1  Total Bilirubin 0.3 - 1.2 mg/dL 0.6 0.5 0.6  Alkaline Phos 38 - 126 U/L 83 62 77  AST 15 - 41 U/L 22 27 22   ALT 0 - 44 U/L 15 19 15     DIAGNOSTIC IMAGING:  I have independently reviewed the scans and discussed with the patient. No results found.   ASSESSMENT:  1.  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 -Flex sigmoidoscopy on 03/06/2019 showed normal sigmoid colon, descending colon and rectosigmoid junction. -CTAP on 10/12/2019 did not show any evidence of recurrent malignancy.  Stable hepatic hypodensities likely cysts. -Flexible  sigmoidoscopy on 03/27/2020 shows proximal rectum, rectosigmoid colon and sigmoid colon distal descending colon within normal limits.  A few nonbleeding distal rectal angiodysplastic lesions.  Anal papillae were hypertrophic.  Physical exam today did not reveal any palpable inguinal lymph nodes. - Flexible sigmoidoscopy on 05/08/2021 with perianal skin tags, mid sigmoid colon normal, rectum, rectosigmoid colon, distal sigmoid colon normal. - She will have surveillance visits with DRE every 3 to 6 months for 5 years with inguinal lymph node palpation.  Sigmoidoscopy every 6 to 12 months for 3 years.   2.  Right breast lump: -CT scan showed incidental right breast lump.  Patient reports she is aware of this lump for 25 years.  A needle biopsy at that time was benign.  Serial mammograms did not show any growth.   3.  Papillary thyroid cancer: -Total thyroidectomy on 01/04/2019 which showed at least 2 foci, both in the left lobe measuring 0.6 and 0.1 cm.  0.6 cm lesion is the classic form of papillary thyroid carcinoma.  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  No lymph nodes were examined.  PT1AP NX.  No lymphovascular invasion.  Margins negative. -She is status post iodine-131 thyroid remnant ablation. -She is on Synthroid 50 mcg alternating with 75 mcg and follows up with Dr. Dorris Fetch.   PLAN:  1.  Stage II (T2N0) squamous cell carcinoma the anal canal: - Physical examination today was negative for bilateral inguinal adenopathy. - Digital rectal exam showed perianal skin tags.  No palpable mass in the anal canal or in the perianal region. - Reviewed labs from 12/15/2021 which showed normal LFTs and CBC.  TSH is 2.5. - RTC 6 months for follow-up.   Orders placed this encounter:  No orders of the defined types were placed in this encounter.    Derek Jack,  MD [place] [phone]   I, Thana Ates, am acting as a scribe for  Dr. Derek Jack.  I, Derek Jack MD, have reviewed the above documentation for accuracy and completeness, and I agree with the above.

## 2021-12-15 ENCOUNTER — Other Ambulatory Visit: Payer: Self-pay

## 2021-12-15 ENCOUNTER — Inpatient Hospital Stay (HOSPITAL_COMMUNITY): Payer: Medicare Other

## 2021-12-15 ENCOUNTER — Inpatient Hospital Stay (HOSPITAL_COMMUNITY): Payer: Medicare Other | Attending: Hematology | Admitting: Hematology

## 2021-12-15 VITALS — BP 129/76 | HR 72 | Temp 98.1°F | Resp 20 | Wt 121.3 lb

## 2021-12-15 DIAGNOSIS — C211 Malignant neoplasm of anal canal: Secondary | ICD-10-CM

## 2021-12-15 DIAGNOSIS — Z79899 Other long term (current) drug therapy: Secondary | ICD-10-CM | POA: Insufficient documentation

## 2021-12-15 DIAGNOSIS — C73 Malignant neoplasm of thyroid gland: Secondary | ICD-10-CM | POA: Insufficient documentation

## 2021-12-15 LAB — CBC WITH DIFFERENTIAL/PLATELET
Abs Immature Granulocytes: 0.02 10*3/uL (ref 0.00–0.07)
Basophils Absolute: 0.1 10*3/uL (ref 0.0–0.1)
Basophils Relative: 1 %
Eosinophils Absolute: 0.1 10*3/uL (ref 0.0–0.5)
Eosinophils Relative: 2 %
HCT: 36.7 % (ref 36.0–46.0)
Hemoglobin: 12 g/dL (ref 12.0–15.0)
Immature Granulocytes: 0 %
Lymphocytes Relative: 23 %
Lymphs Abs: 1.6 10*3/uL (ref 0.7–4.0)
MCH: 29.6 pg (ref 26.0–34.0)
MCHC: 32.7 g/dL (ref 30.0–36.0)
MCV: 90.4 fL (ref 80.0–100.0)
Monocytes Absolute: 0.6 10*3/uL (ref 0.1–1.0)
Monocytes Relative: 9 %
Neutro Abs: 4.4 10*3/uL (ref 1.7–7.7)
Neutrophils Relative %: 65 %
Platelets: 351 10*3/uL (ref 150–400)
RBC: 4.06 MIL/uL (ref 3.87–5.11)
RDW: 14.1 % (ref 11.5–15.5)
WBC: 6.8 10*3/uL (ref 4.0–10.5)
nRBC: 0 % (ref 0.0–0.2)

## 2021-12-15 LAB — COMPREHENSIVE METABOLIC PANEL
ALT: 15 U/L (ref 0–44)
AST: 22 U/L (ref 15–41)
Albumin: 3.9 g/dL (ref 3.5–5.0)
Alkaline Phosphatase: 83 U/L (ref 38–126)
Anion gap: 8 (ref 5–15)
BUN: 19 mg/dL (ref 8–23)
CO2: 32 mmol/L (ref 22–32)
Calcium: 9 mg/dL (ref 8.9–10.3)
Chloride: 100 mmol/L (ref 98–111)
Creatinine, Ser: 1.01 mg/dL — ABNORMAL HIGH (ref 0.44–1.00)
GFR, Estimated: >60 mL/min (ref >60)
Glucose, Bld: 104 mg/dL — ABNORMAL HIGH (ref 70–99)
Potassium: 3.4 mmol/L — ABNORMAL LOW (ref 3.5–5.1)
Sodium: 140 mmol/L (ref 135–145)
Total Bilirubin: 0.6 mg/dL (ref 0.3–1.2)
Total Protein: 6.9 g/dL (ref 6.5–8.1)

## 2021-12-15 NOTE — Patient Instructions (Addendum)
[place] at [facility] Discharge Instructions  You were seen and examined today by Dr. Delton Coombes. He reviewed your most recent labs your kidney test was slightly high and your potassium was slightly low all other labs look good. Please keep follow up appointment as scheduled in 6 months.   Thank you for choosing [place] at [facility] to provide your oncology and hematology care.  To afford each patient quality time with our provider, please arrive at least 15 minutes before your scheduled appointment time.   If you have a lab appointment with the [facility] please come in thru the Main Entrance and check in at the main information desk.  You need to re-schedule your appointment should you arrive 10 or more minutes late.  We strive to give you quality time with our providers, and arriving late affects you and other patients whose appointments are after yours.  Also, if you no show three or more times for appointments you may be dismissed from the clinic at the providers discretion.     Again, thank you for choosing [facility].  Our hope is that these requests will decrease the amount of time that you wait before being seen by our physicians.       _____________________________________________________________  Should you have questions after your visit to [facility], please contact our office at [phone] and follow the prompts.  Our office hours are 8:00 a.m. and 4:30 p.m. Monday - Friday.  Please note that voicemails left after 4:00 p.m. may not be returned until the following business day.  We are closed weekends and major holidays.  You do have access to a nurse 24-7, just call the main number to the clinic [phone] and do not press any options, hold on the line and a nurse will answer the phone.    For prescription refill requests, have your pharmacy contact our office and allow 72 hours.    Due to  Covid, you will need to wear a mask upon entering the hospital. If you do not have a mask, a mask will be given to you at the Main Entrance upon arrival. For doctor visits, patients may have 1 support person age 69 or older with them. For treatment visits, patients can not have anyone with them due to social distancing guidelines and our immunocompromised population.

## 2022-01-02 ENCOUNTER — Telehealth: Payer: Self-pay | Admitting: "Endocrinology

## 2022-01-02 ENCOUNTER — Other Ambulatory Visit: Payer: Self-pay

## 2022-01-02 MED ORDER — LEVOTHYROXINE SODIUM 75 MCG PO TABS: 75.0000 ug | ORAL_TABLET | Freq: Every day | ORAL | 1 refills | Status: DC

## 2022-01-02 NOTE — Telephone Encounter (Signed)
Pt is calling and requesting a refill on her medication. The pharmacy did not receive the prescription in November as it was for 'fill later' and the patient is needing it now. For 90 day supply.   levothyroxine (EUTHYROX) 75 MCG tablet.  [place], [place] Phone:  [phone]  Fax:  [phone]

## 2022-01-02 NOTE — Telephone Encounter (Signed)
Prescription has been sent to requested pharmacy.

## 2022-04-20 ENCOUNTER — Telehealth: Payer: Self-pay | Admitting: "Endocrinology

## 2022-04-20 NOTE — Telephone Encounter (Signed)
Pt called and would like a call back to discuss setting back her body scan. [phone] ?

## 2022-04-21 ENCOUNTER — Telehealth: Payer: Self-pay | Admitting: "Endocrinology

## 2022-04-21 DIAGNOSIS — E890 Postprocedural hypothyroidism: Secondary | ICD-10-CM

## 2022-04-21 NOTE — Telephone Encounter (Signed)
Left a message requesting a return call to the office. 

## 2022-04-21 NOTE — Telephone Encounter (Signed)
Pt is requesting that she hold off on the whole body scan until 2024. ?

## 2022-04-22 NOTE — Telephone Encounter (Signed)
Spoke with pt, she asked if you would be in agreement with her postponing her body scan until the 1st of the year. States she has a lot of different things she is involved in and it would work out better to postpone.  ?

## 2022-04-22 NOTE — Telephone Encounter (Signed)
Discussed with pt, understanding voiced. 

## 2022-04-22 NOTE — Telephone Encounter (Signed)
Pt returning your call

## 2022-04-27 NOTE — Telephone Encounter (Signed)
Pt.notified

## 2022-05-06 ENCOUNTER — Other Ambulatory Visit (HOSPITAL_COMMUNITY): Payer: Medicare Other

## 2022-05-07 ENCOUNTER — Other Ambulatory Visit (HOSPITAL_COMMUNITY): Payer: Medicare Other

## 2022-05-08 ENCOUNTER — Other Ambulatory Visit (HOSPITAL_COMMUNITY): Payer: Medicare Other

## 2022-05-11 ENCOUNTER — Other Ambulatory Visit (HOSPITAL_COMMUNITY): Payer: Medicare Other

## 2022-05-13 ENCOUNTER — Other Ambulatory Visit (HOSPITAL_COMMUNITY): Payer: Medicare Other

## 2022-05-14 ENCOUNTER — Other Ambulatory Visit (HOSPITAL_COMMUNITY): Payer: Medicare Other

## 2022-05-15 ENCOUNTER — Other Ambulatory Visit (HOSPITAL_COMMUNITY): Payer: Medicare Other

## 2022-05-18 ENCOUNTER — Other Ambulatory Visit (HOSPITAL_COMMUNITY): Payer: Medicare Other

## 2022-05-18 NOTE — Telephone Encounter (Signed)
Can you update patient lab order. They will be expired before her appt.

## 2022-05-18 NOTE — Addendum Note (Signed)
Addended by: Ellin Saba on: 05/18/2022 02:30 PM   Modules accepted: Orders

## 2022-05-18 NOTE — Telephone Encounter (Signed)
Lab orders updated and sent to [facility]. 

## 2022-05-18 NOTE — Telephone Encounter (Signed)
Pt wants to know if she still needs to keep her appointment with blood work since she moved her scan to getting done next year in 2024.

## 2022-05-28 ENCOUNTER — Ambulatory Visit: Payer: Medicare Other | Admitting: "Endocrinology

## 2022-06-08 ENCOUNTER — Other Ambulatory Visit (HOSPITAL_COMMUNITY)
Admission: RE | Admit: 2022-06-08 | Discharge: 2022-06-08 | Disposition: A | Discharge status: Home / Self Care | Payer: Medicare Other | Source: Ambulatory Visit | Attending: "Endocrinology | Admitting: "Endocrinology

## 2022-06-08 ENCOUNTER — Inpatient Hospital Stay (HOSPITAL_COMMUNITY): Payer: Medicare Other | Attending: Hematology

## 2022-06-08 DIAGNOSIS — C211 Malignant neoplasm of anal canal: Secondary | ICD-10-CM | POA: Insufficient documentation

## 2022-06-08 DIAGNOSIS — G629 Polyneuropathy, unspecified: Secondary | ICD-10-CM | POA: Diagnosis not present

## 2022-06-08 DIAGNOSIS — E890 Postprocedural hypothyroidism: Secondary | ICD-10-CM | POA: Insufficient documentation

## 2022-06-08 DIAGNOSIS — Z85850 Personal history of malignant neoplasm of thyroid: Secondary | ICD-10-CM | POA: Insufficient documentation

## 2022-06-08 LAB — COMPREHENSIVE METABOLIC PANEL
ALT: 14 U/L (ref 0–44)
AST: 21 U/L (ref 15–41)
Albumin: 4.2 g/dL (ref 3.5–5.0)
Alkaline Phosphatase: 81 U/L (ref 38–126)
Anion gap: 5 (ref 5–15)
BUN: 14 mg/dL (ref 8–23)
CO2: 31 mmol/L (ref 22–32)
Calcium: 9.2 mg/dL (ref 8.9–10.3)
Chloride: 102 mmol/L (ref 98–111)
Creatinine, Ser: 1.07 mg/dL — ABNORMAL HIGH (ref 0.44–1.00)
GFR, Estimated: 57 mL/min — ABNORMAL LOW (ref >60)
Glucose, Bld: 103 mg/dL — ABNORMAL HIGH (ref 70–99)
Potassium: 3.8 mmol/L (ref 3.5–5.1)
Sodium: 138 mmol/L (ref 135–145)
Total Bilirubin: 0.9 mg/dL (ref 0.3–1.2)
Total Protein: 7.1 g/dL (ref 6.5–8.1)

## 2022-06-08 LAB — CBC WITH DIFFERENTIAL/PLATELET
Abs Immature Granulocytes: 0.01 10*3/uL (ref 0.00–0.07)
Basophils Absolute: 0.1 10*3/uL (ref 0.0–0.1)
Basophils Relative: 1 %
Eosinophils Absolute: 0.1 10*3/uL (ref 0.0–0.5)
Eosinophils Relative: 1 %
HCT: 38.5 % (ref 36.0–46.0)
Hemoglobin: 12.4 g/dL (ref 12.0–15.0)
Immature Granulocytes: 0 %
Lymphocytes Relative: 26 %
Lymphs Abs: 1.4 10*3/uL (ref 0.7–4.0)
MCH: 29.5 pg (ref 26.0–34.0)
MCHC: 32.2 g/dL (ref 30.0–36.0)
MCV: 91.7 fL (ref 80.0–100.0)
Monocytes Absolute: 0.5 10*3/uL (ref 0.1–1.0)
Monocytes Relative: 9 %
Neutro Abs: 3.5 10*3/uL (ref 1.7–7.7)
Neutrophils Relative %: 63 %
Platelets: 273 10*3/uL (ref 150–400)
RBC: 4.2 MIL/uL (ref 3.87–5.11)
RDW: 13.2 % (ref 11.5–15.5)
WBC: 5.5 10*3/uL (ref 4.0–10.5)
nRBC: 0 % (ref 0.0–0.2)

## 2022-06-08 LAB — TSH: TSH: 0.516 u[IU]/mL (ref 0.350–4.500)

## 2022-06-09 LAB — THYROGLOBULIN ANTIBODY: Thyroglobulin Antibody: <1 IU/mL (ref 0.0–0.9)

## 2022-06-09 LAB — T3, FREE: T3, Free: 3 pg/mL (ref 2.0–4.4)

## 2022-06-15 ENCOUNTER — Inpatient Hospital Stay (HOSPITAL_BASED_OUTPATIENT_CLINIC_OR_DEPARTMENT_OTHER): Payer: Medicare Other | Admitting: Hematology

## 2022-06-15 VITALS — BP 125/67 | HR 78 | Temp 97.1°F | Resp 18 | Ht 61.0 in | Wt 123.0 lb

## 2022-06-15 DIAGNOSIS — C211 Malignant neoplasm of anal canal: Secondary | ICD-10-CM

## 2022-06-15 NOTE — Patient Instructions (Addendum)
[place] at [facility] Discharge Instructions  You were seen and examined today by Dr. Delton Coombes.  Dr. Delton Coombes discussed your most recent lab work and everything looks good. Dr. Delton Coombes sent referral to Dr. Jenetta Downer for sigmoidoscopy.  Follow-up as scheduled in 6 months.    Thank you for choosing [place] at [facility] to provide your oncology and hematology care.  To afford each patient quality time with our provider, please arrive at least 15 minutes before your scheduled appointment time.   If you have a lab appointment with the [facility] please come in thru the Main Entrance and check in at the main information desk.  You need to re-schedule your appointment should you arrive 10 or more minutes late.  We strive to give you quality time with our providers, and arriving late affects you and other patients whose appointments are after yours.  Also, if you no show three or more times for appointments you may be dismissed from the clinic at the providers discretion.     Again, thank you for choosing [facility].  Our hope is that these requests will decrease the amount of time that you wait before being seen by our physicians.       _____________________________________________________________  Should you have questions after your visit to [facility], please contact our office at [phone] and follow the prompts.  Our office hours are 8:00 a.m. and 4:30 p.m. Monday - Friday.  Please note that voicemails left after 4:00 p.m. may not be returned until the following business day.  We are closed weekends and major holidays.  You do have access to a nurse 24-7, just call the main number to the clinic [phone] and do not press any options, hold on the line and a nurse will answer the phone.    For prescription refill requests, have your pharmacy contact our office and allow 72 hours.    Due to Covid,  you will need to wear a mask upon entering the hospital. If you do not have a mask, a mask will be given to you at the Main Entrance upon arrival. For doctor visits, patients may have 1 support person age 63 or older with them. For treatment visits, patients can not have anyone with them due to social distancing guidelines and our immunocompromised population.

## 2022-06-15 NOTE — Progress Notes (Signed)
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  PRIOR THERAPY: Concurrent chemoradiation with Xeloda and mitomycin from 07/05/2018 to 08/17/2018  NGS Results: not done  CURRENT THERAPY: surveillance  BRIEF ONCOLOGIC HISTORY:  Oncology History  Primary cancer of anal canal ([place])  06/02/2018 Initial Diagnosis   Primary cancer of anal canal ([place])   07/05/2018 - 08/01/2018 Chemotherapy   The patient had ondansetron (ZOFRAN) 8 mg in sodium chloride 0.9 % 50 mL IVPB, , Intravenous,  Once, 1 of 1 cycle Administration:  (07/05/2018),  (08/01/2018) mitoMYcin (MUTAMYCIN) chemo injection 15.5 mg, 10 mg/m2 = 15.5 mg, Intravenous,  Once, 1 of 1 cycle Administration: 15.5 mg (07/05/2018), 15.5 mg (08/01/2018)  for chemotherapy treatment.      CANCER STAGING:  Cancer Staging  No matching staging information was found for the patient.  INTERVAL HISTORY:  Margaret Castaneda, a 66 y.o. female, returns for routine follow-up of her squamous cell carcinoma of the anal canal. Margaret Castaneda was last seen on 12/15/2021.   Today she reports feeling good. She reports her neuropathy in her legs is stable; she has numbness and tingling in her whole leg bilaterally while sitting for long periods of time, and it improved with movement. She denies change in BM. Her last colonoscopy was on 05/08/2021. She denies hematochezia and new pains. Her weight is stable.   REVIEW OF SYSTEMS:  Review of Systems  Constitutional:  Negative for appetite change, fatigue and unexpected weight change.  Gastrointestinal:  Negative for blood in stool, constipation and diarrhea.  Neurological:  Positive for numbness.  All other systems reviewed and are negative.   PAST MEDICAL/SURGICAL HISTORY:  Past  Medical History:  Diagnosis Date   Cancer [facility])    Anal Cancer, Thyroid Cancer   History of kidney stones    Hypertension    Neuropathy    Rectal mass 05/18/2018   Past Surgical History:  Procedure Laterality Date   COLONOSCOPY N/A 05/24/2018   Procedure: COLONOSCOPY;  Surgeon: Rogene Houston, MD;  [facility];  Service: Endoscopy;  Laterality: N/A;  7:30   FLEXIBLE SIGMOIDOSCOPY N/A 03/02/2019   Procedure: FLEXIBLE SIGMOIDOSCOPY;  Surgeon: Rogene Houston, MD;  [facility];  Service: Endoscopy;  Laterality: N/A;  [place] N/A 03/27/2020   Procedure: FLEXIBLE SIGMOIDOSCOPY;  Surgeon: Rogene Houston, MD;  [facility];  Service: Endoscopy;  Laterality: N/A;  [place] N/A 05/08/2021   Procedure: FLEXIBLE SIGMOIDOSCOPY;  Surgeon: Rogene Houston, MD;  [facility];  Service: Endoscopy;  Laterality: N/A;  730   reversal tubal ligation     SIGMOIDOSCOPY  01/2019   THYROIDECTOMY N/A 01/04/2019   Procedure: TOTAL THYROIDECTOMY;  Surgeon: Aviva Signs, MD;  Location: [facility];  Service: General;  Laterality: N/A;   THYROIDECTOMY     TONSILLECTOMY     TUBAL LIGATION      SOCIAL HISTORY:  Social History   Socioeconomic History   Marital status: Married    Spouse name: Not on file   Number of children: Not on file   Years of education: Not on file   Highest education level: Not on file  Occupational History   Not on file  Tobacco Use   Smoking status: Never   Smokeless tobacco: Never  Vaping Use   Vaping Use: Never used  Substance and Sexual Activity   Alcohol use: Never   Drug use: Never   Sexual activity: Yes    Birth control/protection: Surgical  Other Topics Concern   Not on file  Social History Narrative   Not on file   Social Determinants of Health   Financial Resource Strain: Not on file  Food Insecurity: Not on file  Transportation Needs: Not on file  Physical Activity: Not on  file  Stress: Not on file  Social Connections: Not on file  Intimate Partner Violence: Not on file    FAMILY HISTORY:  Family History  Problem Relation Age of Onset   Gastric cancer Paternal Grandfather    Hypertension Mother    Kidney cancer Father    Stroke Father    Heart attack Father    Thyroid cancer Sister    Hypertension Brother    Breast cancer Maternal Grandmother    Colon cancer Neg Hx     CURRENT MEDICATIONS:  Current Outpatient Medications  Medication Sig Dispense Refill   acetaminophen (TYLENOL) 500 MG tablet Take 1 tablet (500 mg total) by mouth every 6 (six) hours as needed for moderate pain or headache. 30 tablet 0   amLODipine (NORVASC) 5 MG tablet Take 2.5 mg by mouth in the morning.  4   benazepril (LOTENSIN) 10 MG tablet Take 10 mg by mouth in the morning.     diphenhydrAMINE (BENADRYL) 25 MG tablet Take 25 mg by mouth every 6 (six) hours as needed for allergies.      docusate sodium (COLACE) 100 MG capsule Take 100 mg by mouth 2 (two) times daily as needed (constipation).     famotidine (PEPCID) 10 MG tablet Take 10 mg by mouth daily with lunch.     hydrochlorothiazide (MICROZIDE) 12.5 MG capsule Take 12.5 mg by mouth in the morning.     ibuprofen (ADVIL) 200 MG tablet Take 400 mg by mouth every 8 (eight) hours as needed (pain).     levothyroxine (EUTHYROX) 75 MCG tablet Take 1 tablet (75 mcg total) by mouth daily before breakfast. 90 tablet 1   meclizine (ANTIVERT) 25 MG tablet Take 25 mg by mouth 3 (three) times daily as needed for dizziness.     No current facility-administered medications for this visit.    ALLERGIES:  No Known Allergies  PHYSICAL EXAM:  Performance status (ECOG): 0 - Asymptomatic  Vitals:   06/15/22 1345  BP: 125/67  Pulse: 78  Resp: 18  Temp: (!) 97.1 F (36.2 C)  SpO2: 100%   Wt Readings from Last 3 Encounters:  06/15/22 123 lb 0.3 oz (55.8 kg)  12/15/21 121 lb 4.1 oz (55 kg)  11/24/21 120 lb 12.8 oz (54.8 kg)    Physical Exam Vitals reviewed.  Constitutional:      Appearance: Normal appearance.  Cardiovascular:     Rate and Rhythm: Normal rate and regular rhythm.     Pulses: Normal pulses.     Heart sounds: Normal heart sounds.  Pulmonary:     Effort: Pulmonary effort is normal.     Breath sounds: Normal breath sounds.  Abdominal:     Palpations: Abdomen is soft. There is no hepatomegaly, splenomegaly or mass.     Tenderness: There is no abdominal tenderness.  Lymphadenopathy:     Lower Body: No right inguinal adenopathy. No left inguinal adenopathy.  Neurological:  General: No focal deficit present.     Mental Status: She is alert and oriented to person, place, and time.  Psychiatric:        Mood and Affect: Mood normal.        Behavior: Behavior normal.      LABORATORY DATA:  I have reviewed the labs as listed.     Latest Ref Rng & Units 06/08/2022   10:28 AM 12/15/2021    1:37 PM 05/28/2021    3:04 PM  CBC  WBC 4.0 - 10.5 K/uL 5.5  6.8  6.0   Hemoglobin 12.0 - 15.0 g/dL 12.4  12.0  11.7   Hematocrit 36.0 - 46.0 % 38.5  36.7  35.3   Platelets 150 - 400 K/uL 273  351  261       Latest Ref Rng & Units 06/08/2022   10:28 AM 12/15/2021    1:37 PM 05/28/2021    3:04 PM  CMP  Glucose 70 - 99 mg/dL 103  104  93   BUN 8 - 23 mg/dL '14  19  14   '$ Creatinine 0.44 - 1.00 mg/dL 1.07  1.01  1.05   Sodium 135 - 145 mmol/L 138  140  140   Potassium 3.5 - 5.1 mmol/L 3.8  3.4  3.1   Chloride 98 - 111 mmol/L 102  100  101   CO2 22 - 32 mmol/L 31  32  33   Calcium 8.9 - 10.3 mg/dL 9.2  9.0  8.9   Total Protein 6.5 - 8.1 g/dL 7.1  6.9  6.5   Total Bilirubin 0.3 - 1.2 mg/dL 0.9  0.6  0.5   Alkaline Phos 38 - 126 U/L 81  83  62   AST 15 - 41 U/L '21  22  27   '$ ALT 0 - 44 U/L '14  15  19     '$ DIAGNOSTIC IMAGING:  I have independently reviewed the scans and discussed with the patient. No results found.   ASSESSMENT:  1.  Stage II (T2N0) squamous cell carcinoma the anal canal: -XRT with  Xeloda and mitomycin from 07/05/2018 through 08/17/2018. -Flex sigmoidoscopy on 03/06/2019 showed normal sigmoid colon, descending colon and rectosigmoid junction. -CTAP on 10/12/2019 did not show any evidence of recurrent malignancy.  Stable hepatic hypodensities likely cysts. -Flexible sigmoidoscopy on 03/27/2020 shows proximal rectum, rectosigmoid colon and sigmoid colon distal descending colon within normal limits.  A few nonbleeding distal rectal angiodysplastic lesions.  Anal papillae were hypertrophic.  Physical exam today did not reveal any palpable inguinal lymph nodes. - Flexible sigmoidoscopy on 05/08/2021 with perianal skin tags, mid sigmoid colon normal, rectum, rectosigmoid colon, distal sigmoid colon normal. - She will have surveillance visits with DRE every 3 to 6 months for 5 years with inguinal lymph node palpation.  Sigmoidoscopy every 6 to 12 months for 3 years.   2.  Right breast lump: -CT scan showed incidental right breast lump.  Patient reports she is aware of this lump for 25 years.  A needle biopsy at that time was benign.  Serial mammograms did not show any growth.   3.  Papillary thyroid cancer: -Total thyroidectomy on 01/04/2019 which showed at least 2 foci, both in the left lobe measuring 0.6 and 0.1 cm.  0.6 cm lesion is the classic form of papillary thyroid carcinoma.  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  No lymph nodes were examined.  PT1AP NX.  No lymphovascular  invasion.  Margins negative. -She is status post iodine-131 thyroid remnant ablation. -She is on Synthroid 50 mcg alternating with 75 mcg and follows up with Dr. Dorris Fetch.   PLAN:  1.  Stage II (T2N0) squamous cell carcinoma the anal canal: - She denies any change in bowel habits or bleeding per rectum. - Physical examination today did not reveal any palpable bilateral inguinal adenopathy. - Reviewed labs from 06/08/2022 which was unchanged and normal. - Recommend GI  evaluation for sigmoidoscopy/anoscopy.  We will make a referral to Dr. Jenetta Downer. - RTC 6 months for follow-up with labs.  2.  Neuropathy: - She has numbness/tingling in bilateral lower extremities on and off.  This has been stable and does not require any pharmacological intervention.     Orders placed this encounter:  No orders of the defined types were placed in this encounter.    Derek Jack, MD [place] [phone]   I, Thana Ates, am acting as a scribe for Dr. Derek Jack.  I, Derek Jack MD, have reviewed the above documentation for accuracy and completeness, and I agree with the above.

## 2022-06-16 ENCOUNTER — Encounter (INDEPENDENT_AMBULATORY_CARE_PROVIDER_SITE_OTHER): Payer: Self-pay | Admitting: *Deleted

## 2022-06-25 ENCOUNTER — Ambulatory Visit (INDEPENDENT_AMBULATORY_CARE_PROVIDER_SITE_OTHER): Payer: Medicare Other | Admitting: "Endocrinology

## 2022-06-25 ENCOUNTER — Encounter: Payer: Self-pay | Admitting: "Endocrinology

## 2022-06-25 VITALS — BP 126/64 | HR 64 | Ht 61.0 in | Wt 120.2 lb

## 2022-06-25 DIAGNOSIS — C73 Malignant neoplasm of thyroid gland: Secondary | ICD-10-CM | POA: Diagnosis not present

## 2022-06-25 DIAGNOSIS — E890 Postprocedural hypothyroidism: Secondary | ICD-10-CM

## 2022-06-25 MED ORDER — LEVOTHYROXINE SODIUM 75 MCG PO TABS
75.0000 ug | ORAL_TABLET | Freq: Every day | ORAL | 1 refills | Status: DC
Start: 2022-06-25 — End: 2022-12-29

## 2022-06-25 NOTE — Progress Notes (Signed)
06/25/2022, 7:22 PM         Endocrinology follow-up note   Subjective:    Patient ID: Margaret Castaneda, female    DOB: 1956/08/17, PCP Kennieth Rad, MD   Past Medical History:  Diagnosis Date   Cancer [facility])    Anal Cancer, Thyroid Cancer   History of kidney stones    Hypertension    Neuropathy    Rectal mass 05/18/2018   Past Surgical History:  Procedure Laterality Date   COLONOSCOPY N/A 05/24/2018   Procedure: COLONOSCOPY;  Surgeon: Rogene Houston, MD;  [facility];  Service: Endoscopy;  Laterality: N/A;  7:30   FLEXIBLE SIGMOIDOSCOPY N/A 03/02/2019   Procedure: FLEXIBLE SIGMOIDOSCOPY;  Surgeon: Rogene Houston, MD;  [facility];  Service: Endoscopy;  Laterality: N/A;  [place] N/A 03/27/2020   Procedure: FLEXIBLE SIGMOIDOSCOPY;  Surgeon: Rogene Houston, MD;  [facility];  Service: Endoscopy;  Laterality: N/A;  [place] N/A 05/08/2021   Procedure: FLEXIBLE SIGMOIDOSCOPY;  Surgeon: Rogene Houston, MD;  [facility];  Service: Endoscopy;  Laterality: N/A;  41   reversal tubal ligation     SIGMOIDOSCOPY  01/2019   THYROIDECTOMY N/A 01/04/2019   Procedure: TOTAL THYROIDECTOMY;  Surgeon: Aviva Signs, MD;  Location: [facility];  Service: General;  Laterality: N/A;   THYROIDECTOMY     TONSILLECTOMY     TUBAL LIGATION     Social History   Socioeconomic History   Marital status: Married    Spouse name: Not on file   Number of children: Not on file   Years of education: Not on file   Highest education level: Not on file  Occupational History   Not on file  Tobacco Use   Smoking status: Never   Smokeless tobacco: Never  Vaping Use   Vaping Use: Never used  Substance and Sexual Activity   Alcohol use: Never   Drug use: Never   Sexual activity: Yes    Birth control/protection: Surgical  Other Topics Concern   Not on file  Social  History Narrative   Not on file   Social Determinants of Health   Financial Resource Strain: Not on file  Food Insecurity: Not on file  Transportation Needs: Not on file  Physical Activity: Not on file  Stress: Not on file  Social Connections: Not on file   Outpatient Encounter Medications as of 06/25/2022  Medication Sig   acetaminophen (TYLENOL) 500 MG tablet Take 1 tablet (500 mg total) by mouth every 6 (six) hours as needed for moderate pain or headache.   amLODipine (NORVASC) 5 MG tablet Take 2.5 mg by mouth in the morning.   benazepril (LOTENSIN) 10 MG tablet Take 10 mg by mouth in the morning.   diphenhydrAMINE (BENADRYL) 25 MG tablet Take 25 mg by mouth every 6 (six) hours as needed for allergies.    docusate sodium (COLACE) 100 MG capsule Take 100 mg by mouth 2 (two) times daily as needed (constipation).   famotidine (PEPCID) 10 MG tablet Take 10 mg by mouth daily with lunch.   hydrochlorothiazide (MICROZIDE) 12.5 MG capsule Take 12.5 mg by  mouth in the morning.   ibuprofen (ADVIL) 200 MG tablet Take 400 mg by mouth every 8 (eight) hours as needed (pain).   levothyroxine (EUTHYROX) 75 MCG tablet Take 1 tablet (75 mcg total) by mouth daily before breakfast.   meclizine (ANTIVERT) 25 MG tablet Take 25 mg by mouth 3 (three) times daily as needed for dizziness.   [DISCONTINUED] levothyroxine (EUTHYROX) 75 MCG tablet Take 1 tablet (75 mcg total) by mouth daily before breakfast.   No facility-administered encounter medications on file as of 06/25/2022.   ALLERGIES: No Known Allergies  VACCINATION STATUS: Immunization History  Administered Date(s) Administered   Influenza-Unspecified 11/01/2020    HPI Margaret Castaneda is 66 y.o. 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  -She has no new complaints today. She is currently on Synthroid 75 mcg p.o.  daily before breakfast.  She is tolerating this medication, previsit thyroid function tests are consistent with appropriate replacement.    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  Subsequent biopsy of 0.7 cm nodule in the left lobe of the thyroid in November 2019 revealed papillary thyroid cancer.  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  -Her Thyrogen stimulated whole-body scan after thyroid remnant ablation was negative for distant metastasis.  Prior to her last visit,  thyroid/neck ultrasound was also negative for residual/recurrent thyroid tissue no cervical lymphadenopathy on December 27, 2019.  Her most recent Thyrogen stimulated whole-body scan on December 16, 2020 showed: Small focus of radio iodine accumulation in the RIGHT cervical region.  This is faint and nonspecific; this could represent residual tracer within the RIGHT thyroid bed, subtle nodal disease, or mild physiologic accumulation of tracer in the esophagus from salivary origin.   Recommended subsequent thyroid ultrasound on May 14, 2021 did not show any thyroid remnant or recurrence.    Due to a change in her insurance, she had to postpone her subsequent surveillance study until January 2024.  -Her labs in March 2020 showed detectable thyroglobulin of 23 with undetectable thyroglobulin antibodies.  However her thyroglobulin on May 14, 2021 is undetectable with undetectable antithyroglobulin bodies. -Her previsit thyroid function tests are consistent with appropriate replacement with free T4 1.04, TSH 2.4.      -She is currently on levothyroxine 75 mcg and 50 mcg on alternate days.    She reports compliance to her medication.  She  feels better, has no new complaints today.  -She denies any prior exposure to neck radiation, has 1 sister with thyroid malignancy.  -She remains off of calcium.  Her most recent labs show calcium of 9.5 improving from 8.3  from immediately after her surgery. -She denies palpitations, heat intolerance, tremors.    Objective:    BP 126/64   Pulse 64   Ht '5\' 1"'$  (1.549 m)   Wt 120 lb 3.2 oz (54.5 kg)   BMI 22.71 kg/m   Wt Readings from Last 3 Encounters:  06/25/22 120 lb 3.2 oz (54.5 kg)  06/15/22 123 lb 0.3 oz (55.8 kg)  12/15/21 121 lb 4.1 oz (55 kg)    Physical Exam   CMP  Component Value Date/Time   NA 138 06/08/2022 1028   K 3.8 06/08/2022 1028   CL 102 06/08/2022 1028   CO2 31 06/08/2022 1028   GLUCOSE 103 (H) 06/08/2022 1028   BUN 14 06/08/2022 1028   CREATININE 1.07 (H) 06/08/2022 1028   CREATININE 0.95 05/18/2018 1416   CALCIUM 9.2 06/08/2022 1028   PROT 7.1 06/08/2022 1028   ALBUMIN 4.2 06/08/2022 1028   AST 21 06/08/2022 1028   ALT 14 06/08/2022 1028   ALKPHOS 81 06/08/2022 1028   BILITOT 0.9 06/08/2022 1028   GFRNONAA 57 (L) 06/08/2022 1028   GFRAA >60 05/15/2020 1414    Lab Results  Component Value Date   TSH 0.516 06/08/2022   TSH 2.55 11/17/2021   TSH 2.404 05/14/2021   TSH 3.57 02/17/2021   TSH 330.657 (H) 12/13/2020   TSH 1.62 08/07/2020   TSH 0.834 05/15/2020   TSH 0.03 (A) 09/18/2019   TSH 0.052 (L) 06/01/2019   TSH 0.411 03/06/2019   FREET4 1.04 05/14/2021   FREET4 1.10 12/13/2020   FREET4 1.46 03/06/2019   FREET4 1.47 02/24/2019     Thyroid/neck ultrasound December 27, 2019 No regional cervical lymphadenopathy.   IMPRESSION: Post total thyroidectomy without evidence of residual or locally recurrent disease.    Most recent thyroid instability whole-body scan on December 16, 2020 IMPRESSION: Small focus of radio iodine accumulation in the RIGHT cervical region.   This is faint and nonspecific; this could represent  residual tracer within the RIGHT thyroid bed, subtle nodal disease, or mild physiologic accumulation of tracer in the esophagus from salivary origin.  Recommend thyroid ultrasound and correlation with thyroglobulin Levels.  Recommended thyroid/neck ultrasound on May 14, 2021: No regional cervical lymphadenopathy.   IMPRESSION: Post total thyroidectomy without evidence of residual or locally recurrent disease.  Assessment & Plan:   1. Postsurgical hypothyroidism 2. Papillary carcinoma of thyroid ([place]) -Her previsit thyroid function tests are consistent with appropriate replacement.  She is advised to continue levothyroxine 75 mcg p.o. daily before breakfast.   - We discussed about the correct intake of her thyroid hormone, on empty stomach at fasting, with water, separated by at least 30 minutes from breakfast and other medications,  and separated by more than 4 hours from calcium, iron, multivitamins, acid reflux medications (PPIs). -Patient is made aware of the fact that thyroid hormone replacement is needed for life, dose to be adjusted by periodic monitoring of thyroid function tests.   -She is status post I-131 thyroid remnant ablation with negative post therapy whole-body scan for distant metastasis.  -She has detectable thyroglobulin level 23, with undetectable thyroglobulin antibodies.   Previsit thyroid/neck ultrasound was also negative for residual/recurrent thyroid tissue no cervical lymphadenopathy on December 27, 2019.  -Her previsit thyroid/neck ultrasound is negative for thyroid remnant no recurrence.     Her physical exam today did not reveal any gross findings. She will have thyroid and stimulated whole-body scan before her next visit in 7 months.  She will also have stimulated thyroglobulin level to determined on the day of her whole-body scan.     - I advised her  to maintain close follow up with Kennieth Rad, MD for primary care needs.   I spent 22 minutes in  the care of the patient today including review of labs from Thyroid Function, CMP, and other relevant labs ; imaging/biopsy records (current and previous including abstractions from other facilities); face-to-face time discussing  her lab results and symptoms, medications doses, her options  of short and long term treatment based on the latest standards of care / guidelines;   and documenting the encounter.  Barbette Reichmann Orcutt  participated in the discussions, expressed understanding, and voiced agreement with the above plans.  All questions were answered to her satisfaction. she is encouraged to contact clinic should she have any questions or concerns prior to her return visit.    Follow up plan: Return in about 7 months (around 01/25/2023) for F/U with Pre-visit Labs, F/U with Whole Body Scan w/[facility].   Glade Lloyd, MD [facility] [address] [place], [place] [address] Phone: [phone]  Fax: [phone]     06/25/2022, 7:22 PM  This note was partially dictated with voice recognition software. Similar sounding words can be transcribed inadequately or may not  be corrected upon review.

## 2022-07-16 ENCOUNTER — Encounter (INDEPENDENT_AMBULATORY_CARE_PROVIDER_SITE_OTHER): Payer: Self-pay | Admitting: Gastroenterology

## 2022-07-16 ENCOUNTER — Ambulatory Visit (INDEPENDENT_AMBULATORY_CARE_PROVIDER_SITE_OTHER): Payer: Medicare Other | Admitting: Gastroenterology

## 2022-07-16 VITALS — BP 142/81 | HR 72 | Temp 99.0°F | Ht 61.0 in | Wt 117.9 lb

## 2022-07-16 DIAGNOSIS — C211 Malignant neoplasm of anal canal: Secondary | ICD-10-CM | POA: Diagnosis not present

## 2022-07-16 NOTE — Progress Notes (Signed)
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    HPI:   Margaret Castaneda is a 66 y.o. female with past medical history of anal cancer, thyroid cancer, HTN, neuropathy, rectal mass (04/2018).   Patient presenting today for consult regarding sigmoidoscopy r/t hx of SCC in anal canal.   History:  discovered to have rectal mass by her gynecologist Dr. Caren Griffins Heist back in May 2019.  Colonoscopy revealed distal rectal mass extending into anal canal.  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  She was treated with Xeloda mitomycin and radiation therapy from July 09/16/2018 through 08/17/2018  Last seen virtually 02/13/20 no c/o at that time, occasional small amount of blood when constipated and having to strain but otherwise doing well, constipation well maintained with dietary measures.   She follows with Dr. Delton Coombes for surveillance of her hx of anal carcinoma, was advised to have repeat flex sig every 6-12 months, last in May 2022, most recent visit he recommended one more yearly evaluation with flex sig or anoscopy and then potentially surveillance every 3 years thereafter.   Present:  States that she had flex sigmoidoscopy x3 after her diagnosis. She saw Dr. Delton Coombes recently who wanted her to have repeat flex sig/anoscope, once more before going out to every 3 years. She has contstant pressure in her rectum from previous radiation but no changes in this. Denies rectal bleeding or melena. She tries to avoid constipation, having a BM daily, utilizes dietary measures to keep herself regular, has not used a stool softener in over a year. Denies weight loss or changes in appetite. She tells me she  recently retired and is spending more time with her grandchildren and going camping a lot.   Flexible sigmoidoscopy: 05/08/21- Perianal skin tags found on perianal exam. - The mid sigmoid colon is normal. - The rectum, recto-sigmoid colon, mid sigmoid colon and distal sigmoid colon are normal. - Anal papilla(e) were hypertrophied. -no specimens  Last Colonoscopy: may 2019 entire examined colon normal, likely malignant tumor in distal rectum, 2x2 cm, adjacent to dentate line, external hemorrhoids.  Last Endoscopy:never  Recommendations:  Oncologist recommended sigmoidoscopy every 6-12 months x3 years then every 3 years thereafter   Past Medical History:  Diagnosis Date   Cancer [facility])    Anal Cancer, Thyroid Cancer   History of kidney stones    Hypertension    Neuropathy    Rectal mass 05/18/2018    Past Surgical History:  Procedure Laterality Date   COLONOSCOPY N/A 05/24/2018   Procedure: COLONOSCOPY;  Surgeon: Rogene Houston, MD;  [facility];  Service: Endoscopy;  Laterality: N/A;  7:30   FLEXIBLE SIGMOIDOSCOPY N/A 03/02/2019   Procedure: FLEXIBLE SIGMOIDOSCOPY;  Surgeon: Rogene Houston, MD;  [facility];  Service: Endoscopy;  Laterality: N/A;  [place] N/A 03/27/2020   Procedure: FLEXIBLE SIGMOIDOSCOPY;  Surgeon: Rogene Houston, MD;  [facility];  Service: Endoscopy;  Laterality: N/A;  [place] N/A 05/08/2021   Procedure: FLEXIBLE SIGMOIDOSCOPY;  Surgeon: Rogene Houston, MD;  [facility];  Service: Endoscopy;  Laterality: N/A;  730   reversal tubal ligation  SIGMOIDOSCOPY  01/2019   THYROIDECTOMY N/A 01/04/2019   Procedure: TOTAL THYROIDECTOMY;  Surgeon: Aviva Signs, MD;  Location: [facility];  Service: General;  Laterality: N/A;   THYROIDECTOMY     TONSILLECTOMY     TUBAL LIGATION      Current Outpatient Medications  Medication Sig Dispense Refill   acetaminophen (TYLENOL) 500 MG  tablet Take 1 tablet (500 mg total) by mouth every 6 (six) hours as needed for moderate pain or headache. 30 tablet 0   amLODipine (NORVASC) 5 MG tablet Take 2.5 mg by mouth in the morning.  4   benazepril (LOTENSIN) 10 MG tablet Take 10 mg by mouth in the morning.     diphenhydrAMINE (BENADRYL) 25 MG tablet Take 25 mg by mouth every 6 (six) hours as needed for allergies.      docusate sodium (COLACE) 100 MG capsule Take 100 mg by mouth 2 (two) times daily as needed (constipation).     famotidine (PEPCID) 10 MG tablet Take 10 mg by mouth daily with lunch.     hydrochlorothiazide (MICROZIDE) 12.5 MG capsule Take 12.5 mg by mouth in the morning.     ibuprofen (ADVIL) 200 MG tablet Take 400 mg by mouth every 8 (eight) hours as needed (pain).     levothyroxine (EUTHYROX) 75 MCG tablet Take 1 tablet (75 mcg total) by mouth daily before breakfast. 90 tablet 1   meclizine (ANTIVERT) 25 MG tablet Take 25 mg by mouth 3 (three) times daily as needed for dizziness.     No current facility-administered medications for this visit.    Allergies as of 07/16/2022   (No Known Allergies)    Family History  Problem Relation Age of Onset   Gastric cancer Paternal Grandfather    Hypertension Mother    Kidney cancer Father    Stroke Father    Heart attack Father    Thyroid cancer Sister    Hypertension Brother    Breast cancer Maternal Grandmother    Colon cancer Neg Hx     Social History   Socioeconomic History   Marital status: Married    Spouse name: Not on file   Number of children: Not on file   Years of education: Not on file   Highest education level: Not on file  Occupational History   Not on file  Tobacco Use   Smoking status: Never    Passive exposure: Past   Smokeless tobacco: Never  Vaping Use   Vaping Use: Never used  Substance and Sexual Activity   Alcohol use: Never   Drug use: Never   Sexual activity: Yes    Birth control/protection: Surgical  Other Topics Concern   Not  on file  Social History Narrative   Not on file   Social Determinants of Health   Financial Resource Strain: Not on file  Food Insecurity: Not on file  Transportation Needs: Not on file  Physical Activity: Not on file  Stress: Not on file  Social Connections: Not on file   Review of systems General: negative for malaise, night sweats, fever, chills, weight loss Neck: Negative for lumps, goiter, pain and significant neck swelling Resp: Negative for cough, wheezing, dyspnea at rest CV: Negative for chest pain, leg swelling, palpitations, orthopnea GI: denies melena, hematochezia, nausea, vomiting, diarrhea, constipation, dysphagia, odyonophagia, early satiety or unintentional weight loss. 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 Derm: Negative for itching or  rash Psych: Denies depression, anxiety, memory loss, confusion. No homicidal or suicidal ideation.  Heme: Negative for prolonged bleeding, bruising easily, and swollen nodes. Endocrine: Negative for cold or heat intolerance, polyuria, polydipsia and goiter. Neuro: negative for tremor, gait imbalance, syncope and seizures. The remainder of the review of systems is noncontributory.  Physical Exam: BP (!) 142/81 (BP Location: Left Arm, Patient Position: Sitting, Cuff Size: Normal)   Pulse 72   Temp 99 F (37.2 C) (Oral)   Ht '5\' 1"'$  (1.549 m)   Wt 117 lb 14.4 oz (53.5 kg)   BMI 22.28 kg/m  General:   Alert and oriented. No distress noted. Pleasant and cooperative.  Head:  Normocephalic and atraumatic. Eyes:  Conjuctiva clear without scleral icterus. Mouth:  Oral mucosa pink and moist. Good dentition. No lesions. Heart: Normal rate and rhythm, s1 and s2 heart sounds present.  Lungs: Clear lung sounds in all lobes. Respirations equal and unlabored. Abdomen:  +BS, soft, non-tender and non-distended. No rebound or guarding. No HSM or masses  noted. Derm: No palmar erythema or jaundice Msk:  Symmetrical without gross deformities. Normal posture. Extremities:  Without edema. Neurologic:  Alert and  oriented x4 Psych:  Alert and cooperative. Normal mood and affect.  Invalid input(s): "6 MONTHS"   ASSESSMENT: LEWANNA PETRAK is a 66 y.o. female presenting today for consult to discuss repeat flexible sigmoidoscopy.   SCC of the anal canal diagnosed in 2019, s/p chemo and radiation, she has undergone flexible sigmoidoscopy with no findings of recurrence in 2020, 2021 and 2023 with initial plans to start 3 year surveillance thereafter, however, recent visit with oncologist it was recommended she have one flexible sigmoidoscopy on the yearly schedule then potentially transition to surveillance every 3 years. 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 No red flag symptoms. Patient denies melena, hematochezia, nausea, vomiting, diarrhea, constipation, dysphagia, odyonophagia, early satiety or weight loss. Indications, risks and benefits of procedure discussed in detail with patient. Patient verbalized understanding and is in agreement to proceed with Flexible Sigmoidoscopy at this time.   PLAN:  schedule flex sig, Endo 1, ASA II  2. Plan for flexible sigmoidoscopy potentially q3 year schedule after upcoming evaluation, will follow oncology recommendations on this  All questions were answered, patient verbalized understanding and is in agreement with plan as outlined above.   Follow Up: TBD  Chelcy Bolda L. Alver Sorrow, MSN, APRN, AGNP-C Adult-Gerontology Nurse Practitioner [facility]

## 2022-07-16 NOTE — Patient Instructions (Signed)
It was nice to meet you We will get you scheduled for repeat flexible sigmoidoscopy as part of your surveillance

## 2022-07-17 ENCOUNTER — Encounter (INDEPENDENT_AMBULATORY_CARE_PROVIDER_SITE_OTHER): Payer: Self-pay

## 2022-08-17 ENCOUNTER — Encounter (INDEPENDENT_AMBULATORY_CARE_PROVIDER_SITE_OTHER): Payer: Self-pay

## 2022-08-17 ENCOUNTER — Other Ambulatory Visit (INDEPENDENT_AMBULATORY_CARE_PROVIDER_SITE_OTHER): Payer: Self-pay

## 2022-08-17 DIAGNOSIS — C211 Malignant neoplasm of anal canal: Secondary | ICD-10-CM

## 2022-09-23 ENCOUNTER — Encounter (INDEPENDENT_AMBULATORY_CARE_PROVIDER_SITE_OTHER): Payer: Self-pay

## 2022-09-23 ENCOUNTER — Other Ambulatory Visit (INDEPENDENT_AMBULATORY_CARE_PROVIDER_SITE_OTHER): Payer: Self-pay

## 2022-09-23 DIAGNOSIS — I10 Essential (primary) hypertension: Secondary | ICD-10-CM

## 2022-09-28 ENCOUNTER — Encounter (HOSPITAL_COMMUNITY): Payer: Medicare Other

## 2022-11-04 ENCOUNTER — Other Ambulatory Visit (HOSPITAL_COMMUNITY)
Admission: RE | Admit: 2022-11-04 | Discharge: 2022-11-04 | Disposition: A | Discharge status: Home / Self Care | Payer: Medicare Other | Source: Ambulatory Visit | Attending: Gastroenterology | Admitting: Gastroenterology

## 2022-11-04 DIAGNOSIS — I10 Essential (primary) hypertension: Secondary | ICD-10-CM | POA: Diagnosis present

## 2022-11-04 LAB — BASIC METABOLIC PANEL
Anion gap: 6 (ref 5–15)
BUN: 16 mg/dL (ref 8–23)
CO2: 29 mmol/L (ref 22–32)
Calcium: 8.8 mg/dL — ABNORMAL LOW (ref 8.9–10.3)
Chloride: 103 mmol/L (ref 98–111)
Creatinine, Ser: 1.2 mg/dL — ABNORMAL HIGH (ref 0.44–1.00)
GFR, Estimated: 50 mL/min — ABNORMAL LOW (ref >60)
Glucose, Bld: 92 mg/dL (ref 70–99)
Potassium: 3.6 mmol/L (ref 3.5–5.1)
Sodium: 138 mmol/L (ref 135–145)

## 2022-11-06 ENCOUNTER — Ambulatory Visit (HOSPITAL_BASED_OUTPATIENT_CLINIC_OR_DEPARTMENT_OTHER): Payer: Medicare Other | Admitting: Anesthesiology

## 2022-11-06 ENCOUNTER — Other Ambulatory Visit: Payer: Self-pay

## 2022-11-06 ENCOUNTER — Ambulatory Visit (HOSPITAL_COMMUNITY)
Admission: RE | Admit: 2022-11-06 | Discharge: 2022-11-06 | Disposition: A | Discharge status: Home / Self Care | Payer: Medicare Other | Attending: Gastroenterology | Admitting: Gastroenterology

## 2022-11-06 ENCOUNTER — Encounter (HOSPITAL_COMMUNITY): Payer: Self-pay | Admitting: Gastroenterology

## 2022-11-06 ENCOUNTER — Ambulatory Visit (HOSPITAL_COMMUNITY): Payer: Medicare Other | Admitting: Anesthesiology

## 2022-11-06 ENCOUNTER — Encounter (HOSPITAL_COMMUNITY)
Admission: RE | Disposition: A | Discharge status: Home / Self Care | Payer: Self-pay | Source: Home / Self Care | Attending: Gastroenterology

## 2022-11-06 DIAGNOSIS — Z08 Encounter for follow-up examination after completed treatment for malignant neoplasm: Secondary | ICD-10-CM

## 2022-11-06 DIAGNOSIS — K219 Gastro-esophageal reflux disease without esophagitis: Secondary | ICD-10-CM | POA: Insufficient documentation

## 2022-11-06 DIAGNOSIS — Z85040 Personal history of malignant carcinoid tumor of rectum: Secondary | ICD-10-CM

## 2022-11-06 DIAGNOSIS — I10 Essential (primary) hypertension: Secondary | ICD-10-CM | POA: Diagnosis not present

## 2022-11-06 DIAGNOSIS — Z79899 Other long term (current) drug therapy: Secondary | ICD-10-CM | POA: Insufficient documentation

## 2022-11-06 DIAGNOSIS — K6289 Other specified diseases of anus and rectum: Secondary | ICD-10-CM | POA: Insufficient documentation

## 2022-11-06 DIAGNOSIS — Z85048 Personal history of other malignant neoplasm of rectum, rectosigmoid junction, and anus: Secondary | ICD-10-CM | POA: Insufficient documentation

## 2022-11-06 DIAGNOSIS — Z1211 Encounter for screening for malignant neoplasm of colon: Secondary | ICD-10-CM

## 2022-11-06 DIAGNOSIS — E890 Postprocedural hypothyroidism: Secondary | ICD-10-CM | POA: Insufficient documentation

## 2022-11-06 DIAGNOSIS — Z85850 Personal history of malignant neoplasm of thyroid: Secondary | ICD-10-CM | POA: Diagnosis not present

## 2022-11-06 DIAGNOSIS — C211 Malignant neoplasm of anal canal: Secondary | ICD-10-CM

## 2022-11-06 HISTORY — PX: FLEXIBLE SIGMOIDOSCOPY: SHX5431

## 2022-11-06 SURGERY — SIGMOIDOSCOPY, FLEXIBLE
Anesthesia: General

## 2022-11-06 MED ORDER — PROPOFOL 500 MG/50ML IV EMUL
INTRAVENOUS | Status: DC | PRN
  Administered 2022-11-06: 150 ug/kg/min via INTRAVENOUS

## 2022-11-06 MED ORDER — LACTATED RINGERS IV SOLN: INTRAVENOUS | Status: DC

## 2022-11-06 MED ORDER — LIDOCAINE HCL (PF) 0.5 % IJ SOLN
INTRAMUSCULAR | Status: DC | PRN
  Administered 2022-11-06: 60 mg via INTRAVENOUS

## 2022-11-06 MED ORDER — PROPOFOL 10 MG/ML IV BOLUS
INTRAVENOUS | Status: DC | PRN
  Administered 2022-11-06: 100 mg via INTRAVENOUS

## 2022-11-06 NOTE — Anesthesia Preprocedure Evaluation (Addendum)
Anesthesia Evaluation  Patient identified by MRN, date of birth, ID band Patient awake    Reviewed: Allergy & Precautions, H&P , NPO status , Patient's Chart, lab work & pertinent test results  Airway Mallampati: II  TM Distance: >3 FB Neck ROM: Full    Dental  (+) Dental Advisory Given, Caps   Pulmonary neg pulmonary ROS   Pulmonary exam normal breath sounds clear to auscultation       Cardiovascular Exercise Tolerance: Good hypertension, Pt. on medications Normal cardiovascular exam Rhythm:Regular Rate:Normal     Neuro/Psych negative neurological ROS  negative psych ROS   GI/Hepatic Neg liver ROS,GERD  Medicated and Controlled,,Anal cancer   Endo/Other  Hypothyroidism (post thyroidectomy hypothyroidism)    Renal/GU negative Renal ROS  negative genitourinary   Musculoskeletal negative musculoskeletal ROS (+)    Abdominal   Peds negative pediatric ROS (+)  Hematology negative hematology ROS (+)   Anesthesia Other Findings   Reproductive/Obstetrics negative OB ROS                             Anesthesia Physical Anesthesia Plan  ASA: 2  Anesthesia Plan: General   Post-op Pain Management: Minimal or no pain anticipated   Induction: Intravenous  PONV Risk Score and Plan: Propofol infusion  Airway Management Planned: Nasal Cannula and Natural Airway  Additional Equipment:   Intra-op Plan:   Post-operative Plan:   Informed Consent: I have reviewed the patients History and Physical, chart, labs and discussed the procedure including the risks, benefits and alternatives for the proposed anesthesia with the patient or authorized representative who has indicated his/her understanding and acceptance.     Dental advisory given  Plan Discussed with: CRNA and Surgeon  Anesthesia Plan Comments:        Anesthesia Quick Evaluation

## 2022-11-06 NOTE — Transfer of Care (Signed)
Immediate Anesthesia Transfer of Care Note  Patient: Margaret Castaneda  Procedure(s) Performed: FLEXIBLE SIGMOIDOSCOPY  Patient Location: Short Stay  Anesthesia Type:General  Level of Consciousness: sedated  Airway & Oxygen Therapy: Patient Spontanous Breathing  Post-op Assessment: Report given to RN and Post -op Vital signs reviewed and stable  Post vital signs: Reviewed and stable  Last Vitals:  Vitals Value Taken Time  BP 120/60   Temp 98   Pulse 59   Resp 16   SpO2 96     Last Pain:  Vitals:   11/06/22 0737  TempSrc:   PainSc: 0-No pain      Patients Stated Pain Goal: 5 (55/37/48 2707)  Complications: No notable events documented.

## 2022-11-06 NOTE — Anesthesia Postprocedure Evaluation (Signed)
Anesthesia Post Note  Patient: Margaret Castaneda  Procedure(s) Performed: [place]  Patient location during evaluation: Phase II Anesthesia Type: General Level of consciousness: awake and alert and oriented Pain management: pain level controlled Vital Signs Assessment: post-procedure vital signs reviewed and stable Respiratory status: spontaneous breathing, nonlabored ventilation and respiratory function stable Cardiovascular status: blood pressure returned to baseline and stable Postop Assessment: no apparent nausea or vomiting Anesthetic complications: no  No notable events documented.   Last Vitals:  Vitals:   11/06/22 0749 11/06/22 0753  BP: (!) 94/54 109/67  Pulse: (!) 56 60  Resp: 14 15  Temp: (!) 36.4 C   SpO2: 97% 98%    Last Pain:  Vitals:   11/06/22 0753  TempSrc:   PainSc: 0-No pain                 Nitesh Pitstick C Sharline Lehane

## 2022-11-06 NOTE — Discharge Instructions (Signed)
You are being discharged to home.  Resume your previous diet.  Your physician has recommended a repeat flexible sigmoidoscopy in three years for surveillance.

## 2022-11-06 NOTE — Op Note (Signed)
[facility] Patient Name: Margaret Castaneda Procedure Date: 11/06/2022 7:14 AM MRN: [id number] Date of Birth: 03-Jun-1956 Attending MD: Maylon Peppers , , [id number] CSN: [id number] Age: 66 Admit Type: Outpatient Procedure:                Flexible Sigmoidoscopy Indications:              High risk colon cancer surveillance: Personal                            history of anal cancer Providers:                Maylon Peppers, Tammy Vaught, RN, Kristine L.                            Risa Grill, Technician Referring MD:              Medicines:                Monitored Anesthesia Care Complications:            No immediate complications. Estimated Blood Loss:     Estimated blood loss: none. Procedure:                Pre-Anesthesia Assessment:                           - Prior to the procedure, a History and Physical                            was performed, and patient medications, allergies                            and sensitivities were reviewed. The patient's                            tolerance of previous anesthesia was reviewed.                           - The risks and benefits of the procedure and the                            sedation options and risks were discussed with the                            patient. All questions were answered and informed                            consent was obtained.                           After obtaining informed consent, the scope was                            passed under direct vision. The PCF-HQ190L                            ([id number]) scope was introduced through the  anus and                            advanced to the the sigmoid colon. The flexible                            sigmoidoscopy was accomplished without difficulty.                            The patient tolerated the procedure well. The                            quality of the bowel preparation was excellent. Scope In: 7:39:40 AM Scope Out: 7:45:30 AM Total Procedure  Duration: 0 hours 5 minutes 50 seconds  Findings:      The perianal and digital rectal examinations were normal.      An area of mildly congested mucosa was found in the rectum. No       retroflexion could be performed due to narrow rectum and possible post       radiation fibrosis.      The anus appeared normal. Impression:               - Congested mucosa in the rectum.                           - The entire examined colon is normal.                           - No specimens collected. Moderate Sedation:      Per Anesthesia Care Recommendation:           - Discharge patient to home (ambulatory).                           - Resume previous diet.                           - Repeat flexible sigmoidoscopy in 3 years for                            surveillance. Procedure Code(s):        --- Professional ---                           [phone], Sigmoidoscopy, flexible; diagnostic,                            including collection of specimen(s) by brushing or                            washing, when performed (separate procedure) Diagnosis Code(s):        --- Professional ---                           F16.384, Personal history of other malignant  neoplasm of rectum, rectosigmoid junction, and anus                           K62.89, Other specified diseases of anus and rectum CPT copyright 2022 American Medical Association. All rights reserved. The codes documented in this report are preliminary and upon coder review may  be revised to meet current compliance requirements. Maylon Peppers, MD Maylon Peppers,  11/06/2022 7:52:15 AM This report has been signed electronically. Number of Addenda: 0

## 2022-11-06 NOTE — H&P (Signed)
Margaret Castaneda is an 66 y.o. female.   Chief Complaint: squamous anal cancer surveillance HPI: Margaret Castaneda is a 66 y.o. female with past medical history of anal cancer, thyroid cancer, HTN, neuropathy, coming for surveillance of squamous anal cancer.  The patient denies having any nausea, vomiting, fever, chills, hematochezia, melena, hematemesis, abdominal distention, abdominal pain, diarrhea, jaundice, pruritus or weight loss.  Past Medical History:  Diagnosis Date   Cancer [facility])    Anal Cancer, Thyroid Cancer   History of kidney stones    Hypertension    Neuropathy    Rectal mass 05/18/2018    Past Surgical History:  Procedure Laterality Date   COLONOSCOPY N/A 05/24/2018   Procedure: COLONOSCOPY;  Surgeon: Rogene Houston, MD;  [facility];  Service: Endoscopy;  Laterality: N/A;  7:30   FLEXIBLE SIGMOIDOSCOPY N/A 03/02/2019   Procedure: FLEXIBLE SIGMOIDOSCOPY;  Surgeon: Rogene Houston, MD;  [facility];  Service: Endoscopy;  Laterality: N/A;  [place] N/A 03/27/2020   Procedure: FLEXIBLE SIGMOIDOSCOPY;  Surgeon: Rogene Houston, MD;  [facility];  Service: Endoscopy;  Laterality: N/A;  [place] N/A 05/08/2021   Procedure: FLEXIBLE SIGMOIDOSCOPY;  Surgeon: Rogene Houston, MD;  [facility];  Service: Endoscopy;  Laterality: N/A;  730   reversal tubal ligation     SIGMOIDOSCOPY  01/2019   THYROIDECTOMY N/A 01/04/2019   Procedure: TOTAL THYROIDECTOMY;  Surgeon: Aviva Signs, MD;  Location: [facility];  Service: General;  Laterality: N/A;   THYROIDECTOMY     TONSILLECTOMY     TUBAL LIGATION      Family History  Problem Relation Age of Onset   Gastric cancer Paternal Grandfather    Hypertension Mother    Kidney cancer Father    Stroke Father    Heart attack Father    Thyroid cancer Sister    Hypertension Brother    Breast cancer Maternal Grandmother    Colon cancer Neg Hx    Social  History:  reports that she has never smoked. She has been exposed to tobacco smoke. She has never used smokeless tobacco. She reports that she does not drink alcohol and does not use drugs.  Allergies: No Known Allergies  Medications Prior to Admission  Medication Sig Dispense Refill   acetaminophen (TYLENOL) 500 MG tablet Take 1 tablet (500 mg total) by mouth every 6 (six) hours as needed for moderate pain or headache. 30 tablet 0   benazepril (LOTENSIN) 10 MG tablet Take 10 mg by mouth in the morning.     diphenhydrAMINE (BENADRYL) 25 MG tablet Take 25 mg by mouth every 6 (six) hours as needed for allergies.      famotidine (PEPCID) 20 MG tablet Take 20 mg by mouth daily.     hydrochlorothiazide (MICROZIDE) 12.5 MG capsule Take 12.5 mg by mouth in the morning.     ibuprofen (ADVIL) 200 MG tablet Take 200-400 mg by mouth every 6 (six) hours as needed for moderate pain.     levothyroxine (EUTHYROX) 75 MCG tablet Take 1 tablet (75 mcg total) by mouth daily before breakfast. 90 tablet 1    Results for orders placed or performed during the hospital encounter of 11/04/22 (from the past 48 hour(s))  Basic Metabolic Panel (BMET)     Status: Abnormal   Collection Time: 11/04/22 10:43 AM  Result Value Ref Range   Sodium 138 135 - 145 mmol/L  Potassium 3.6 3.5 - 5.1 mmol/L   Chloride 103 98 - 111 mmol/L   CO2 29 22 - 32 mmol/L   Glucose, Bld 92 70 - 99 mg/dL    Comment: Glucose reference range applies only to samples taken after fasting for at least 8 hours.   BUN 16 8 - 23 mg/dL   Creatinine, Ser 1.20 (H) 0.44 - 1.00 mg/dL   Calcium 8.8 (L) 8.9 - 10.3 mg/dL   GFR, Estimated 50 (L) >60 mL/min    Comment: (NOTE) Calculated using the CKD-EPI Creatinine Equation (2021)    Anion gap 6 5 - 15    Comment: Performed at [facility], [address], [place], [place] [address]   No results found.  Review of Systems  All other systems reviewed and are negative.   Blood pressure (!) 152/90,  pulse 61, temperature 97.8 F (36.6 C), temperature source Oral, resp. rate 13, height '5\' 1"'$  (1.549 m), weight 54.4 kg, SpO2 100 %. Physical Exam  GENERAL: The patient is AO x3, in no acute distress. HEENT: Head is normocephalic and atraumatic. EOMI are intact. Mouth is well hydrated and without lesions. NECK: Supple. No masses LUNGS: Clear to auscultation. No presence of rhonchi/wheezing/rales. Adequate chest expansion HEART: RRR, normal s1 and s2. ABDOMEN: Soft, nontender, no guarding, no peritoneal signs, and nondistended. BS +. No masses. EXTREMITIES: Without any cyanosis, clubbing, rash, lesions or edema. NEUROLOGIC: AOx3, no focal motor deficit. SKIN: no jaundice, no rashes  Assessment/Plan Margaret Castaneda is a 66 y.o. female with past medical history of anal cancer, thyroid cancer, HTN, neuropathy, coming for surveillance of squamous anal cancer.  We will proceed with flexible sigmoidoscopy.  Harvel Quale, MD 11/06/2022, 7:34 AM

## 2022-11-12 ENCOUNTER — Encounter (HOSPITAL_COMMUNITY): Payer: Self-pay | Admitting: Gastroenterology

## 2022-12-07 ENCOUNTER — Inpatient Hospital Stay: Payer: Medicare Other | Attending: Hematology

## 2022-12-07 DIAGNOSIS — Z85850 Personal history of malignant neoplasm of thyroid: Secondary | ICD-10-CM | POA: Insufficient documentation

## 2022-12-07 DIAGNOSIS — C211 Malignant neoplasm of anal canal: Secondary | ICD-10-CM

## 2022-12-07 LAB — COMPREHENSIVE METABOLIC PANEL
ALT: 14 U/L (ref 0–44)
AST: 20 U/L (ref 15–41)
Albumin: 3.8 g/dL (ref 3.5–5.0)
Alkaline Phosphatase: 70 U/L (ref 38–126)
Anion gap: 6 (ref 5–15)
BUN: 18 mg/dL (ref 8–23)
CO2: 30 mmol/L (ref 22–32)
Calcium: 9.1 mg/dL (ref 8.9–10.3)
Chloride: 104 mmol/L (ref 98–111)
Creatinine, Ser: 1.21 mg/dL — ABNORMAL HIGH (ref 0.44–1.00)
GFR, Estimated: 49 mL/min — ABNORMAL LOW (ref >60)
Glucose, Bld: 97 mg/dL (ref 70–99)
Potassium: 4.1 mmol/L (ref 3.5–5.1)
Sodium: 140 mmol/L (ref 135–145)
Total Bilirubin: 0.7 mg/dL (ref 0.3–1.2)
Total Protein: 6.5 g/dL (ref 6.5–8.1)

## 2022-12-07 LAB — CBC WITH DIFFERENTIAL/PLATELET
Abs Immature Granulocytes: 0.01 10*3/uL (ref 0.00–0.07)
Basophils Absolute: 0.1 10*3/uL (ref 0.0–0.1)
Basophils Relative: 1 %
Eosinophils Absolute: 0.1 10*3/uL (ref 0.0–0.5)
Eosinophils Relative: 2 %
HCT: 36.1 % (ref 36.0–46.0)
Hemoglobin: 11.8 g/dL — ABNORMAL LOW (ref 12.0–15.0)
Immature Granulocytes: 0 %
Lymphocytes Relative: 30 %
Lymphs Abs: 1.3 10*3/uL (ref 0.7–4.0)
MCH: 29.8 pg (ref 26.0–34.0)
MCHC: 32.7 g/dL (ref 30.0–36.0)
MCV: 91.2 fL (ref 80.0–100.0)
Monocytes Absolute: 0.4 10*3/uL (ref 0.1–1.0)
Monocytes Relative: 10 %
Neutro Abs: 2.5 10*3/uL (ref 1.7–7.7)
Neutrophils Relative %: 57 %
Platelets: 252 10*3/uL (ref 150–400)
RBC: 3.96 MIL/uL (ref 3.87–5.11)
RDW: 13.1 % (ref 11.5–15.5)
WBC: 4.4 10*3/uL (ref 4.0–10.5)
nRBC: 0 % (ref 0.0–0.2)

## 2022-12-08 ENCOUNTER — Other Ambulatory Visit (HOSPITAL_COMMUNITY): Payer: Medicare Other

## 2022-12-14 ENCOUNTER — Inpatient Hospital Stay (HOSPITAL_BASED_OUTPATIENT_CLINIC_OR_DEPARTMENT_OTHER): Payer: Medicare Other | Admitting: Hematology

## 2022-12-14 VITALS — BP 146/75 | HR 63 | Temp 97.5°F | Resp 18 | Ht 61.0 in | Wt 117.0 lb

## 2022-12-14 DIAGNOSIS — C211 Malignant neoplasm of anal canal: Secondary | ICD-10-CM | POA: Diagnosis not present

## 2022-12-14 NOTE — Patient Instructions (Addendum)
[place] at [facility] Discharge Instructions   You were seen and examined today by Dr. Delton Coombes.  He reviewed the results of your lab work which are normal/stable.   The results of the sigmoidoscopy was normal.   We will see you back in 6 months. We will repeat blood work on that day.    Thank you for choosing [place] at [facility] to provide your oncology and hematology care.  To afford each patient quality time with our provider, please arrive at least 15 minutes before your scheduled appointment time.   If you have a lab appointment with the [facility] please come in thru the Main Entrance and check in at the main information desk.  You need to re-schedule your appointment should you arrive 10 or more minutes late.  We strive to give you quality time with our providers, and arriving late affects you and other patients whose appointments are after yours.  Also, if you no show three or more times for appointments you may be dismissed from the clinic at the providers discretion.     Again, thank you for choosing [facility].  Our hope is that these requests will decrease the amount of time that you wait before being seen by our physicians.       _____________________________________________________________  Should you have questions after your visit to [facility], please contact our office at [phone] and follow the prompts.  Our office hours are 8:00 a.m. and 4:30 p.m. Monday - Friday.  Please note that voicemails left after 4:00 p.m. may not be returned until the following business day.  We are closed weekends and major holidays.  You do have access to a nurse 24-7, just call the main number to the clinic [phone] and do not press any options, hold on the line and a nurse will answer the phone.    For prescription refill requests, have your pharmacy contact our office and allow 72 hours.    Due  to Covid, you will need to wear a mask upon entering the hospital. If you do not have a mask, a mask will be given to you at the Main Entrance upon arrival. For doctor visits, patients may have 1 support person age 6 or older with them. For treatment visits, patients can not have anyone with them due to social distancing guidelines and our immunocompromised population.

## 2022-12-14 NOTE — Progress Notes (Signed)
[place], [place] [id number]   CLINIC:  Medical Oncology/Hematology  PCP:  Kennieth Rad, MD [facility] [address] 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  PRIOR THERAPY: Concurrent chemoradiation with Xeloda and mitomycin from 07/05/2018 to 08/17/2018  NGS Results: not done  CURRENT THERAPY: surveillance  BRIEF ONCOLOGIC HISTORY:  Oncology History  Primary cancer of anal canal ([place])  06/02/2018 Initial Diagnosis   Primary cancer of anal canal ([place])   07/05/2018 - 08/01/2018 Chemotherapy   The patient had ondansetron (ZOFRAN) 8 mg in sodium chloride 0.9 % 50 mL IVPB, , Intravenous,  Once, 1 of 1 cycle Administration:  (07/05/2018),  (08/01/2018) mitoMYcin (MUTAMYCIN) chemo injection 15.5 mg, 10 mg/m2 = 15.5 mg, Intravenous,  Once, 1 of 1 cycle Administration: 15.5 mg (07/05/2018), 15.5 mg (08/01/2018)  for chemotherapy treatment.      CANCER STAGING:  Cancer Staging  No matching staging information was found for the patient.  INTERVAL HISTORY:  Margaret Castaneda, a 66 y.o. female, seen for follow-up of squamous cell carcinoma of the anal canal.  She underwent sigmoidoscopy by Dr. Jenetta Downer on 11/06/2022.  Denies any rectal bleeding.  No new onset pains.  REVIEW OF SYSTEMS:  Review of Systems  Constitutional:  Negative for appetite change, fatigue and unexpected weight change.  Gastrointestinal:  Negative for blood in stool, constipation and diarrhea.  Neurological:  Positive for numbness (Legs and feet on and off).  All other systems reviewed and are negative.   PAST MEDICAL/SURGICAL HISTORY:  Past Medical History:  Diagnosis Date   Cancer [facility])    Anal Cancer, Thyroid Cancer   History of kidney stones    Hypertension    Neuropathy    Rectal mass 05/18/2018   Past Surgical History:  Procedure Laterality Date   COLONOSCOPY N/A 05/24/2018    Procedure: COLONOSCOPY;  Surgeon: Rogene Houston, MD;  [facility];  Service: Endoscopy;  Laterality: N/A;  7:30   FLEXIBLE SIGMOIDOSCOPY N/A 03/02/2019   Procedure: FLEXIBLE SIGMOIDOSCOPY;  Surgeon: Rogene Houston, MD;  [facility];  Service: Endoscopy;  Laterality: N/A;  [place] N/A 03/27/2020   Procedure: FLEXIBLE SIGMOIDOSCOPY;  Surgeon: Rogene Houston, MD;  [facility];  Service: Endoscopy;  Laterality: N/A;  [place] N/A 05/08/2021   Procedure: FLEXIBLE SIGMOIDOSCOPY;  Surgeon: Rogene Houston, MD;  [facility];  Service: Endoscopy;  Laterality: N/A;  [place] N/A 11/06/2022   Procedure: FLEXIBLE SIGMOIDOSCOPY;  Surgeon: Harvel Quale, MD;  [facility];  Service: Gastroenterology;  Laterality: N/A;  730 ASA 2, pt knows to arrive at 6:30   reversal tubal ligation     SIGMOIDOSCOPY  01/2019   THYROIDECTOMY N/A 01/04/2019   Procedure: TOTAL THYROIDECTOMY;  Surgeon: Aviva Signs, MD;  Location: [facility];  Service: General;  Laterality: N/A;   THYROIDECTOMY     TONSILLECTOMY     TUBAL LIGATION      SOCIAL HISTORY:  Social History   Socioeconomic History   Marital status: Married    Spouse name: Not on file   Number of children: Not on file   Years of education: Not on file   Highest education level: Not on file  Occupational History   Not on file  Tobacco Use  Smoking status: Never    Passive exposure: Past   Smokeless tobacco: Never  Vaping Use   Vaping Use: Never used  Substance and Sexual Activity   Alcohol use: Never   Drug use: Never   Sexual activity: Yes    Birth control/protection: Surgical  Other Topics Concern   Not on file  Social History Narrative   Not on file   Social Determinants of Health   Financial Resource Strain: Not on file  Food Insecurity: Not on file  Transportation Needs: Not on file  Physical Activity: Not  on file  Stress: Not on file  Social Connections: Not on file  Intimate Partner Violence: Not on file    FAMILY HISTORY:  Family History  Problem Relation Age of Onset   Gastric cancer Paternal Grandfather    Hypertension Mother    Kidney cancer Father    Stroke Father    Heart attack Father    Thyroid cancer Sister    Hypertension Brother    Breast cancer Maternal Grandmother    Colon cancer Neg Hx     CURRENT MEDICATIONS:  Current Outpatient Medications  Medication Sig Dispense Refill   acetaminophen (TYLENOL) 500 MG tablet Take 1 tablet (500 mg total) by mouth every 6 (six) hours as needed for moderate pain or headache. 30 tablet 0   benazepril (LOTENSIN) 10 MG tablet Take 10 mg by mouth in the morning.     diphenhydrAMINE (BENADRYL) 25 MG tablet Take 25 mg by mouth every 6 (six) hours as needed for allergies.      famotidine (PEPCID) 20 MG tablet Take 20 mg by mouth daily.     hydrochlorothiazide (MICROZIDE) 12.5 MG capsule Take 12.5 mg by mouth in the morning.     ibuprofen (ADVIL) 200 MG tablet Take 200-400 mg by mouth every 6 (six) hours as needed for moderate pain.     levothyroxine (EUTHYROX) 75 MCG tablet Take 1 tablet (75 mcg total) by mouth daily before breakfast. 90 tablet 1   No current facility-administered medications for this visit.    ALLERGIES:  No Known Allergies  PHYSICAL EXAM:  Performance status (ECOG): 0 - Asymptomatic  Vitals:   12/14/22 1126  BP: (!) 146/75  Pulse: 63  Resp: 18  Temp: (!) 97.5 F (36.4 C)  SpO2: 100%   Wt Readings from Last 3 Encounters:  12/14/22 117 lb (53.1 kg)  11/06/22 120 lb (54.4 kg)  07/16/22 117 lb 14.4 oz (53.5 kg)   Physical Exam Vitals reviewed.  Constitutional:      Appearance: Normal appearance.  Cardiovascular:     Rate and Rhythm: Normal rate and regular rhythm.     Pulses: Normal pulses.     Heart sounds: Normal heart sounds.  Pulmonary:     Effort: Pulmonary effort is normal.     Breath  sounds: Normal breath sounds.  Abdominal:     Palpations: Abdomen is soft. There is no hepatomegaly, splenomegaly or mass.     Tenderness: There is no abdominal tenderness.  Lymphadenopathy:     Lower Body: No right inguinal adenopathy. No left inguinal adenopathy.  Neurological:     General: No focal deficit present.     Mental Status: She is alert and oriented to person, place, and time.  Psychiatric:        Mood and Affect: Mood normal.        Behavior: Behavior normal.      LABORATORY DATA:  I have reviewed the labs  as listed.     Latest Ref Rng & Units 12/07/2022   10:01 AM 06/08/2022   10:28 AM 12/15/2021    1:37 PM  CBC  WBC 4.0 - 10.5 K/uL 4.4  5.5  6.8   Hemoglobin 12.0 - 15.0 g/dL 11.8  12.4  12.0   Hematocrit 36.0 - 46.0 % 36.1  38.5  36.7   Platelets 150 - 400 K/uL 252  273  351       Latest Ref Rng & Units 12/07/2022   10:01 AM 11/04/2022   10:43 AM 06/08/2022   10:28 AM  CMP  Glucose 70 - 99 mg/dL 97  92  103   BUN 8 - 23 mg/dL '18  16  14   '$ Creatinine 0.44 - 1.00 mg/dL 1.21  1.20  1.07   Sodium 135 - 145 mmol/L 140  138  138   Potassium 3.5 - 5.1 mmol/L 4.1  3.6  3.8   Chloride 98 - 111 mmol/L 104  103  102   CO2 22 - 32 mmol/L '30  29  31   '$ Calcium 8.9 - 10.3 mg/dL 9.1  8.8  9.2   Total Protein 6.5 - 8.1 g/dL 6.5   7.1   Total Bilirubin 0.3 - 1.2 mg/dL 0.7   0.9   Alkaline Phos 38 - 126 U/L 70   81   AST 15 - 41 U/L 20   21   ALT 0 - 44 U/L 14   14     DIAGNOSTIC IMAGING:  I have independently reviewed the scans and discussed with the patient. No results found.   ASSESSMENT:  1.  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 -Flex sigmoidoscopy on 03/06/2019 showed normal sigmoid colon, descending colon and rectosigmoid junction. -CTAP on 10/12/2019 did not show any evidence of recurrent malignancy.  Stable hepatic hypodensities likely cysts. -Flexible sigmoidoscopy on  03/27/2020 shows proximal rectum, rectosigmoid colon and sigmoid colon distal descending colon within normal limits.  A few nonbleeding distal rectal angiodysplastic lesions.  Anal papillae were hypertrophic.  Physical exam today did not reveal any palpable inguinal lymph nodes. - Flexible sigmoidoscopy on 05/08/2021 with perianal skin tags, mid sigmoid colon normal, rectum, rectosigmoid colon, distal sigmoid colon normal. - She will have surveillance visits with DRE every 3 to 6 months for 5 years with inguinal lymph node palpation.  Sigmoidoscopy every 6 to 12 months for 3 years.   2.  Right breast lump: -CT scan showed incidental right breast lump.  Patient reports she is aware of this lump for 25 years.  A needle biopsy at that time was benign.  Serial mammograms did not show any growth.   3.  Papillary thyroid cancer: -Total thyroidectomy on 01/04/2019 which showed at least 2 foci, both in the left lobe measuring 0.6 and 0.1 cm.  0.6 cm lesion is the classic form of papillary thyroid carcinoma.  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  No lymph nodes were examined.  PT1AP NX.  No lymphovascular invasion.  Margins negative. -She is status post iodine-131 thyroid remnant ablation. -She is on Synthroid 50 mcg alternating with 75 mcg and follows up with Dr. Dorris Fetch.   PLAN:  1.  Stage II (T2N0) squamous cell carcinoma the anal canal: -I have reviewed sigmoidoscopy from 11/06/2022 which showed congested mucosa in the rectum and normal colon. - Reviewed labs today which showed normal  LFTs.  CKD stable with creatinine 1.21.  CBC was grossly normal. - Physical examination: No palpable inguinal lymph nodes. - RTC 6 months for follow-up with repeat labs and exam.  She will complete 5 years next August.  2.  Neuropathy: - On and off numbness in the legs and feet is stable.     Orders placed this encounter:  No orders of the defined types were placed in this  encounter.    Derek Jack, MD [place] [phone]   I, Margaret Castaneda, am acting as a scribe for Dr. Derek Jack.  I, Derek Jack MD, have reviewed the above documentation for accuracy and completeness, and I agree with the above.

## 2022-12-15 ENCOUNTER — Ambulatory Visit (HOSPITAL_COMMUNITY): Payer: Medicare Other | Admitting: Hematology

## 2022-12-28 ENCOUNTER — Other Ambulatory Visit: Payer: Self-pay | Admitting: "Endocrinology

## 2023-01-05 NOTE — Written Directive (Signed)
MOLECULAR IMAGING AND THERAPEUTICS WRITTEN DIRECTIVE   PATIENT NAME: Margaret Castaneda  PT DOB:   [birth year]                                              MRN: [id number]  ---------------------------------------------------------------------------------------------------------------------  I-131 WHOLE BODY SCAN    RADIOPHARMACEUTICAL: Iodine-131 Capsule for Diagnostic Imaging   PRESCRIBED DOSE FOR ADMINISTRATION:  4 mCi   ROUTE OFADMINISTRATION: PO   DIAGNOSIS:  Thyroid Cancer   REFERRING PHYSICIAN: Dr. Donia Ast STIMULATION OR HORMONE WITHDRAW:  Thyrogen Stimulation   DATE OF THYROIDECTOMY: 01-04-2019   SURGEON: Dr. Arnoldo Morale   TSH:   Lab Results  Component Value Date   TSH 0.516 06/08/2022   TSH 2.55 11/17/2021   TSH 2.404 05/14/2021     PRIOR I-131 THERAPY (Date and Dose): 39.9 mCi on 02-24-2019   ADDITIONAL PHYSICIAN COMMENTS/NOTES   AUTHORIZED USER SIGNATURE & TIME STAMP:

## 2023-01-06 ENCOUNTER — Encounter (HOSPITAL_COMMUNITY): Payer: Self-pay

## 2023-01-06 ENCOUNTER — Ambulatory Visit (HOSPITAL_COMMUNITY)
Admission: RE | Admit: 2023-01-06 | Discharge: 2023-01-06 | Disposition: A | Discharge status: Home / Self Care | Payer: Medicare Other | Source: Ambulatory Visit | Attending: "Endocrinology | Admitting: "Endocrinology

## 2023-01-06 DIAGNOSIS — C73 Malignant neoplasm of thyroid gland: Secondary | ICD-10-CM | POA: Insufficient documentation

## 2023-01-06 MED ORDER — THYROTROPIN ALFA 0.9 MG IM SOLR
INTRAMUSCULAR | Status: AC
  Administered 2023-01-06: 0.9 mg via INTRAMUSCULAR
  Filled 2023-01-06: qty 0.9

## 2023-01-06 MED ORDER — THYROTROPIN ALFA 0.9 MG IM SOLR: 0.9000 mg | INTRAMUSCULAR | Status: AC

## 2023-01-06 MED ORDER — STERILE WATER FOR INJECTION IJ SOLN
INTRAMUSCULAR | Status: AC
  Administered 2023-01-06: 1 mL
  Filled 2023-01-06: qty 10

## 2023-01-07 ENCOUNTER — Encounter (HOSPITAL_COMMUNITY): Payer: Self-pay

## 2023-01-07 ENCOUNTER — Ambulatory Visit: Payer: Medicare Other | Admitting: "Endocrinology

## 2023-01-07 ENCOUNTER — Encounter (HOSPITAL_COMMUNITY)
Admission: RE | Admit: 2023-01-07 | Discharge: 2023-01-07 | Disposition: A | Discharge status: Home / Self Care | Payer: Medicare Other | Source: Ambulatory Visit | Attending: "Endocrinology | Admitting: "Endocrinology

## 2023-01-07 DIAGNOSIS — C73 Malignant neoplasm of thyroid gland: Secondary | ICD-10-CM | POA: Diagnosis not present

## 2023-01-07 MED ORDER — THYROTROPIN ALFA 0.9 MG IM SOLR
INTRAMUSCULAR | Status: AC
  Administered 2023-01-07: 0.9 mg via INTRAMUSCULAR
  Filled 2023-01-07: qty 0.9

## 2023-01-07 MED ORDER — STERILE WATER FOR INJECTION IJ SOLN
INTRAMUSCULAR | Status: AC
  Filled 2023-01-07: qty 10

## 2023-01-07 MED ORDER — THYROTROPIN ALFA 0.9 MG IM SOLR: 0.9000 mg | INTRAMUSCULAR | Status: AC

## 2023-01-08 ENCOUNTER — Encounter (HOSPITAL_COMMUNITY): Payer: Self-pay

## 2023-01-08 ENCOUNTER — Other Ambulatory Visit (HOSPITAL_COMMUNITY)
Admission: RE | Admit: 2023-01-08 | Discharge: 2023-01-08 | Disposition: A | Discharge status: Home / Self Care | Payer: Medicare Other | Source: Ambulatory Visit | Attending: "Endocrinology | Admitting: "Endocrinology

## 2023-01-08 ENCOUNTER — Encounter (HOSPITAL_COMMUNITY)
Admission: RE | Admit: 2023-01-08 | Discharge: 2023-01-08 | Disposition: A | Discharge status: Home / Self Care | Payer: Medicare Other | Source: Ambulatory Visit | Attending: "Endocrinology | Admitting: "Endocrinology

## 2023-01-08 DIAGNOSIS — C73 Malignant neoplasm of thyroid gland: Secondary | ICD-10-CM | POA: Diagnosis not present

## 2023-01-08 LAB — T4, FREE: Free T4: 0.95 ng/dL (ref 0.61–1.12)

## 2023-01-08 LAB — TSH: TSH: 306.854 u[IU]/mL — ABNORMAL HIGH (ref 0.350–4.500)

## 2023-01-08 MED ORDER — SODIUM IODIDE I 131 CAPSULE
4.0000 | Freq: Once | INTRAVENOUS | Status: AC | PRN
  Administered 2023-01-08: 3.95 via ORAL

## 2023-01-11 ENCOUNTER — Encounter (HOSPITAL_COMMUNITY)
Admission: RE | Admit: 2023-01-11 | Discharge: 2023-01-11 | Disposition: A | Discharge status: Home / Self Care | Payer: Medicare Other | Source: Ambulatory Visit | Attending: "Endocrinology | Admitting: "Endocrinology

## 2023-01-11 DIAGNOSIS — C73 Malignant neoplasm of thyroid gland: Secondary | ICD-10-CM | POA: Diagnosis not present

## 2023-01-12 LAB — THYROGLOBULIN ANTIBODY: Thyroglobulin Antibody: <1 IU/mL (ref 0.0–0.9)

## 2023-01-17 LAB — THYROGLOBULIN LEVEL: Thyroglobulin: <2 ng/mL

## 2023-01-18 ENCOUNTER — Encounter: Payer: Self-pay | Admitting: "Endocrinology

## 2023-01-18 ENCOUNTER — Ambulatory Visit (INDEPENDENT_AMBULATORY_CARE_PROVIDER_SITE_OTHER): Payer: Medicare Other | Admitting: "Endocrinology

## 2023-01-18 VITALS — BP 162/86 | HR 60 | Ht 61.0 in | Wt 119.4 lb

## 2023-01-18 DIAGNOSIS — E890 Postprocedural hypothyroidism: Secondary | ICD-10-CM | POA: Diagnosis not present

## 2023-01-18 DIAGNOSIS — C73 Malignant neoplasm of thyroid gland: Secondary | ICD-10-CM | POA: Diagnosis not present

## 2023-01-18 MED ORDER — LEVOTHYROXINE SODIUM 75 MCG PO TABS: ORAL_TABLET | ORAL | 1 refills | Status: DC

## 2023-01-18 NOTE — Progress Notes (Signed)
01/18/2023, 12:50 PM         Endocrinology follow-up note   Subjective:    Patient ID: Margaret Castaneda, female    DOB: 04/26/1956, PCP Kennieth Rad, MD   Past Medical History:  Diagnosis Date   Cancer [facility])    Anal Cancer, Thyroid Cancer   History of kidney stones    Hypertension    Neuropathy    Rectal mass 05/18/2018   Past Surgical History:  Procedure Laterality Date   COLONOSCOPY N/A 05/24/2018   Procedure: COLONOSCOPY;  Surgeon: Rogene Houston, MD;  [facility];  Service: Endoscopy;  Laterality: N/A;  7:30   FLEXIBLE SIGMOIDOSCOPY N/A 03/02/2019   Procedure: FLEXIBLE SIGMOIDOSCOPY;  Surgeon: Rogene Houston, MD;  [facility];  Service: Endoscopy;  Laterality: N/A;  [place] N/A 03/27/2020   Procedure: FLEXIBLE SIGMOIDOSCOPY;  Surgeon: Rogene Houston, MD;  [facility];  Service: Endoscopy;  Laterality: N/A;  [place] N/A 05/08/2021   Procedure: FLEXIBLE SIGMOIDOSCOPY;  Surgeon: Rogene Houston, MD;  [facility];  Service: Endoscopy;  Laterality: N/A;  [place] N/A 11/06/2022   Procedure: FLEXIBLE SIGMOIDOSCOPY;  Surgeon: Harvel Quale, MD;  [facility];  Service: Gastroenterology;  Laterality: N/A;  730 ASA 2, pt knows to arrive at 6:30   reversal tubal ligation     SIGMOIDOSCOPY  01/2019   THYROIDECTOMY N/A 01/04/2019   Procedure: TOTAL THYROIDECTOMY;  Surgeon: Aviva Signs, MD;  Location: [facility];  Service: General;  Laterality: N/A;   THYROIDECTOMY     TONSILLECTOMY     TUBAL LIGATION     Social History   Socioeconomic History   Marital status: Married    Spouse name: Not on file   Number of children: Not on file   Years of education: Not on file   Highest education level: Not on file  Occupational History   Not on file  Tobacco Use   Smoking status: Never    Passive  exposure: Past   Smokeless tobacco: Never  Vaping Use   Vaping Use: Never used  Substance and Sexual Activity   Alcohol use: Never   Drug use: Never   Sexual activity: Yes    Birth control/protection: Surgical  Other Topics Concern   Not on file  Social History Narrative   Not on file   Social Determinants of Health   Financial Resource Strain: Not on file  Food Insecurity: Not on file  Transportation Needs: Not on file  Physical Activity: Not on file  Stress: Not on file  Social Connections: Not on file   Outpatient Encounter Medications as of 01/18/2023  Medication Sig   acetaminophen (TYLENOL) 500 MG tablet Take 1 tablet (500 mg total) by mouth every 6 (six) hours as needed for moderate pain or headache.   benazepril (LOTENSIN) 10 MG tablet Take 10 mg by mouth in the morning.   diphenhydrAMINE (BENADRYL) 25 MG tablet Take 25 mg by mouth every 6 (six) hours as needed for allergies.    famotidine (PEPCID) 20 MG tablet Take 20 mg by mouth daily.   hydrochlorothiazide (MICROZIDE)  12.5 MG capsule Take 25 mg by mouth in the morning.   ibuprofen (ADVIL) 200 MG tablet Take 200-400 mg by mouth every 6 (six) hours as needed for moderate pain.   levothyroxine (SYNTHROID) 75 MCG tablet TAKE 1 TABLET (75 MCG TOTAL) BY MOUTH ONCE DAILY BEFORE BREAKFAST   [DISCONTINUED] levothyroxine (SYNTHROID) 75 MCG tablet TAKE 1 TABLET (75 MCG TOTAL) BY MOUTH ONCE DAILY BEFORE BREAKFAST   No facility-administered encounter medications on file as of 01/18/2023.   ALLERGIES: No Known Allergies  VACCINATION STATUS: Immunization History  Administered Date(s) Administered   Influenza-Unspecified 11/01/2020   Moderna Sars-Covid-2 Vaccination 01/11/2020, 02/09/2020, 11/01/2020    HPI Margaret Castaneda is 67 y.o. 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  -She has no new  complaints today. She is currently on Synthroid 75 mcg p.o. daily before breakfast.  She is tolerating this medication, previsit thyroid function tests are consistent with appropriate replacement.    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  Subsequent biopsy of 0.7 cm nodule in the left lobe of the thyroid in November 2019 revealed papillary thyroid cancer.  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   Her most recent Thyrogen stimulated whole-body scan was negative for iodine avid recurrent thyroid cancer or distant metastasis on January 11, 2023.    Her previsit thyroid function tests show high TSH due to Thyrogen injection.  -She is currently on levothyroxine 75 mcg and 50 mcg on alternate days.    She reports compliance to her medication.  She feels better, has no new complaints today.  -She denies any prior exposure to neck radiation, has 1 sister with thyroid malignancy.  -She remains off of calcium.  Her most recent labs show calcium of 9.5 improving from 8.3  from immediately after her surgery. -She denies palpitations, heat intolerance, tremors.    Objective:    BP (!) 162/86 Comment: 156/88 R arm  Pulse 60   Ht '5\' 1"'$  (1.549 m)   Wt 119 lb 6.4 oz (54.2 kg)   BMI 22.56 kg/m   Wt Readings from Last 3 Encounters:  01/18/23 119 lb 6.4 oz (54.2 kg)  12/14/22 117 lb (53.1 kg)  11/06/22 120 lb (54.4 kg)    Physical Exam   CMP     Component Value Date/Time   NA 140 12/07/2022 1001   K 4.1 12/07/2022 1001   CL 104 12/07/2022 1001   CO2 30 12/07/2022 1001   GLUCOSE 97 12/07/2022 1001   BUN 18 12/07/2022 1001   CREATININE 1.21 (H) 12/07/2022 1001   CREATININE 0.95 05/18/2018 1416    CALCIUM 9.1 12/07/2022 1001   PROT 6.5 12/07/2022 1001   ALBUMIN 3.8 12/07/2022 1001   AST 20 12/07/2022 1001   ALT 14 12/07/2022 1001   ALKPHOS 70 12/07/2022 1001   BILITOT 0.7 12/07/2022 1001   GFRNONAA 49 (L) 12/07/2022 1001   GFRAA >60 05/15/2020 1414    Lab Results  Component Value Date   TSH 306.854 (H) 01/08/2023   TSH 0.516 06/08/2022   TSH 2.55 11/17/2021   TSH 2.404 05/14/2021   TSH  3.57 02/17/2021   TSH 330.657 (H) 12/13/2020   TSH 1.62 08/07/2020   TSH 0.834 05/15/2020   TSH 0.03 (A) 09/18/2019   TSH 0.052 (L) 06/01/2019   FREET4 0.95 01/08/2023   FREET4 1.04 05/14/2021   FREET4 1.10 12/13/2020   FREET4 1.46 03/06/2019   FREET4 1.47 02/24/2019     Thyroid/neck ultrasound December 27, 2019 No regional cervical lymphadenopathy.   IMPRESSION: Post total thyroidectomy without evidence of residual or locally recurrent disease.    Most recent thyroid instability whole-body scan on December 16, 2020 IMPRESSION: Small focus of radio iodine accumulation in the RIGHT cervical region.   This is faint and nonspecific; this could represent residual tracer within the RIGHT thyroid bed, subtle nodal disease, or mild physiologic accumulation of tracer in the esophagus from salivary origin.  Recommend thyroid ultrasound and correlation with thyroglobulin Levels.  Recommended thyroid/neck ultrasound on May 14, 2021: No regional cervical lymphadenopathy.   IMPRESSION: Post total thyroidectomy without evidence of residual or locally recurrent disease.   Thyrogen stimulated whole-body scan on January 11, 2023 FINDINGS: No radiotracer activity in the thyroid bed. No evidence of abnormal radiotracer accumulation on whole-body I 131 scan.   Physiologic activity noted in the GI and GU tract.   IMPRESSION: No evidence of local thyroid cancer recurrence or distant metastasis.    Assessment & Plan:   1. Postsurgical hypothyroidism 2. Papillary carcinoma of  thyroid ([place]) -Her previsit thyroid function tests are consistent with appropriate replacement.  She is advised to continue levothyroxine 75 mcg p.o. daily before breakfast.    - We discussed about the correct intake of her thyroid hormone, on empty stomach at fasting, with water, separated by at least 30 minutes from breakfast and other medications,  and separated by more than 4 hours from calcium, iron, multivitamins, acid reflux medications (PPIs). -Patient is made aware of the fact that thyroid hormone replacement is needed for life, dose to be adjusted by periodic monitoring of thyroid function tests.  -She is status post I-131 thyroid remnant ablation with negative post therapy whole-body scan for distant metastasis.  -She has detectable thyroglobulin level 23, with undetectable thyroglobulin antibodies.   Previsit thyroid/neck ultrasound was also negative for residual/recurrent thyroid tissue no cervical lymphadenopathy on December 27, 2019.  -Her previsit thyroid/neck ultrasound is negative for thyroid remnant no recurrence.     Her physical exam today did not reveal any gross findings. Her previsit thyroidectomy and whole-body scan is negative for iodine avid recurrent thyroid cancer or distant metastasis.    - I advised her  to maintain close follow up with Kennieth Rad, MD for primary care needs.   I spent 21 minutes in the care of the patient today including review of labs from Thyroid Function, CMP, and other relevant labs ; imaging/biopsy records (current and previous including abstractions from other facilities); face-to-face time discussing  her lab results and symptoms, medications doses, her options of short and long term treatment based on the latest standards of care / guidelines;   and documenting the encounter.  Barbette Reichmann Repinski  participated in the discussions, expressed understanding, and voiced agreement with the above plans.  All questions were answered to her  satisfaction. she is encouraged to contact clinic should she have any questions or concerns prior to her return visit.   Follow up plan: Return in about 6 months (around 07/19/2023) for F/U with Pre-visit Labs.   Glade Lloyd, MD [facility] [address]  [address] [place], [place] [address] Phone: [phone]  Fax: [phone]     01/18/2023, 12:50 PM  This note was partially dictated with voice recognition software. Similar sounding words can be transcribed inadequately or may not  be corrected upon review.

## 2023-03-25 ENCOUNTER — Other Ambulatory Visit: Payer: Self-pay | Admitting: "Endocrinology

## 2023-06-08 ENCOUNTER — Other Ambulatory Visit: Payer: Self-pay | Admitting: "Endocrinology

## 2023-06-11 ENCOUNTER — Other Ambulatory Visit: Payer: Self-pay

## 2023-06-11 DIAGNOSIS — C211 Malignant neoplasm of anal canal: Secondary | ICD-10-CM

## 2023-06-13 NOTE — Progress Notes (Signed)
[facility] [address] [place] [address]    Clinic Day:  06/14/2023  Referring physician: Aggie Cosier, MD  Patient Care Team: Aggie Cosier, MD as PCP - General (Internal Medicine) Doreatha Massed, MD as Medical Oncologist (Medical Oncology)   ASSESSMENT & PLAN:   Assessment: 1.  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 -Flex sigmoidoscopy on 03/06/2019 showed normal sigmoid colon, descending colon and rectosigmoid junction. -CTAP on 10/12/2019 did not show any evidence of recurrent malignancy.  Stable hepatic hypodensities likely cysts. -Flexible sigmoidoscopy on 03/27/2020 shows proximal rectum, rectosigmoid colon and sigmoid colon distal descending colon within normal limits.  A few nonbleeding distal rectal angiodysplastic lesions.  Anal papillae were hypertrophic.  Physical exam today did not reveal any palpable inguinal lymph nodes. - Flexible sigmoidoscopy on 05/08/2021 with perianal skin tags, mid sigmoid colon normal, rectum, rectosigmoid colon, distal sigmoid colon normal. - She will have surveillance visits with DRE every 3 to 6 months for 5 years with inguinal lymph node palpation.  Sigmoidoscopy every 6 to 12 months for 3 years.   2.  Right breast lump: -CT scan showed incidental right breast lump.  Patient reports she is aware of this lump for 25 years.  A needle biopsy at that time was benign.  Serial mammograms did not show any growth.   3.  Papillary thyroid cancer: -Total thyroidectomy on 01/04/2019 which showed at least 2 foci, both in the left lobe measuring 0.6 and 0.1 cm.  0.6 cm lesion is the classic form of papillary thyroid carcinoma.  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  No lymph nodes were examined.  PT1AP NX.  No lymphovascular invasion.  Margins negative. -She is status post iodine-131 thyroid remnant  ablation. -She is on Synthroid 50 mcg alternating with 75 mcg and follows up with Dr. Fransico Him.    Plan: 1.  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 - Physical exam: No palpable inguinal lymphadenopathy. - Sigmoidoscopy (11/07/2019): Congested mucosa in the rectum and normal colon. - Labs: Normal LFTs.  Mildly elevated creatinine 1.22 stable.  CBC normal. - She will complete 5 years in end of August. - Continue follow-up with GI for colonoscopy. - RTC 1 year for follow-up with repeat labs.  If she is stable at next visit, will let her follow-up with her PMD.   2.  Neuropathy: - On and off numbness in the legs and feet is stable.   Orders Placed This Encounter  Procedures   CBC with Differential    Standing Status:   Future    Standing Expiration Date:   06/13/2024   Comprehensive metabolic panel    Standing Status:   Future    Standing Expiration Date:   06/13/2024      I,Katie Daubenspeck,acting as a scribe for Doreatha Massed, MD.,have documented all relevant documentation on the behalf of Doreatha Massed, MD,as directed by  Doreatha Massed, MD while in the presence of Doreatha Massed, MD.   I, Doreatha Massed MD, have reviewed the above documentation for accuracy and completeness, and I agree with the above.   Doreatha Massed, MD   6/17/202412:50 PM  CHIEF COMPLAINT:   Diagnosis: squamous cell carcinoma of the anal canal    Cancer Staging  No matching staging information was found for the patient.  Prior Therapy: Concurrent chemoradiation with Xeloda and mitomycin from 07/05/2018 to 08/17/2018   Current Therapy:  surveillance    HISTORY OF PRESENT ILLNESS:   Oncology History  Primary cancer of anal canal (HCC)  06/02/2018 Initial Diagnosis   Primary cancer of anal canal (HCC)   07/05/2018 - 08/01/2018 Chemotherapy   The patient had ondansetron (ZOFRAN) 8 mg in sodium  chloride 0.9 % 50 mL IVPB, , Intravenous,  Once, 1 of 1 cycle Administration:  (07/05/2018),  (08/01/2018) mitoMYcin (MUTAMYCIN) chemo injection 15.5 mg, 10 mg/m2 = 15.5 mg, Intravenous,  Once, 1 of 1 cycle Administration: 15.5 mg (07/05/2018), 15.5 mg (08/01/2018)  for chemotherapy treatment.       INTERVAL HISTORY:   Margaret Castaneda is a 67 y.o. female presenting to clinic today for follow up of squamous cell carcinoma of the anal canal. She was last seen by me on 12/14/22.  Today, she states that she is doing well overall. Her appetite level is at 100%. Her energy level is at 100%.  PAST MEDICAL HISTORY:   Past Medical History: Past Medical History:  Diagnosis Date   Cancer (HCC)    Anal Cancer, Thyroid Cancer   History of kidney stones    Hypertension    Neuropathy    Rectal mass 05/18/2018    Surgical History: Past Surgical History:  Procedure Laterality Date   COLONOSCOPY N/A 05/24/2018   Procedure: COLONOSCOPY;  Surgeon: Malissa Hippo, MD;  [facility];  Service: Endoscopy;  Laterality: N/A;  7:30   FLEXIBLE SIGMOIDOSCOPY N/A 03/02/2019   Procedure: FLEXIBLE SIGMOIDOSCOPY;  Surgeon: Malissa Hippo, MD;  [facility];  Service: Endoscopy;  Laterality: N/A;  245   FLEXIBLE SIGMOIDOSCOPY N/A 03/27/2020   Procedure: FLEXIBLE SIGMOIDOSCOPY;  Surgeon: Malissa Hippo, MD;  [facility];  Service: Endoscopy;  Laterality: N/A;  1145   FLEXIBLE SIGMOIDOSCOPY N/A 05/08/2021   Procedure: FLEXIBLE SIGMOIDOSCOPY;  Surgeon: Malissa Hippo, MD;  [facility];  Service: Endoscopy;  Laterality: N/A;  730   FLEXIBLE SIGMOIDOSCOPY N/A 11/06/2022   Procedure: FLEXIBLE SIGMOIDOSCOPY;  Surgeon: Dolores Frame, MD;  [facility];  Service: Gastroenterology;  Laterality: N/A;  730 ASA 2, pt knows to arrive at 6:30   reversal tubal ligation     SIGMOIDOSCOPY  01/2019   THYROIDECTOMY N/A 01/04/2019   Procedure: TOTAL THYROIDECTOMY;  Surgeon:  Franky Macho, MD;  Location: [facility];  Service: General;  Laterality: N/A;   THYROIDECTOMY     TONSILLECTOMY     TUBAL LIGATION      Social History: Social History   Socioeconomic History   Marital status: Married    Spouse name: Not on file   Number of children: Not on file   Years of education: Not on file   Highest education level: Not on file  Occupational History   Not on file  Tobacco Use   Smoking status: Never    Passive exposure: Past   Smokeless tobacco: Never  Vaping Use   Vaping Use: Never used  Substance and Sexual Activity   Alcohol use: Never   Drug use: Never   Sexual activity: Yes    Birth control/protection: Surgical  Other Topics Concern   Not on file  Social History Narrative   Not on file   Social Determinants of Health   Financial Resource Strain: Not on file  Food Insecurity: Not on file  Transportation Needs: Not on file  Physical Activity:  Not on file  Stress: Not on file  Social Connections: Not on file  Intimate Partner Violence: Not on file    Family History: Family History  Problem Relation Age of Onset   Gastric cancer Paternal Grandfather    Hypertension Mother    Kidney cancer Father    Stroke Father    Heart attack Father    Thyroid cancer Sister    Hypertension Brother    Breast cancer Maternal Grandmother    Colon cancer Neg Hx     Current Medications:  Current Outpatient Medications:    acetaminophen (TYLENOL) 500 MG tablet, Take 1 tablet (500 mg total) by mouth every 6 (six) hours as needed for moderate pain or headache., Disp: 30 tablet, Rfl: 0   benazepril (LOTENSIN) 10 MG tablet, Take 10 mg by mouth in the morning., Disp: , Rfl:    diphenhydrAMINE (BENADRYL) 25 MG tablet, Take 25 mg by mouth every 6 (six) hours as needed for allergies. , Disp: , Rfl:    famotidine (PEPCID) 20 MG tablet, Take 20 mg by mouth daily., Disp: , Rfl:    hydrochlorothiazide (MICROZIDE) 12.5 MG capsule, Take 25 mg by mouth in the  morning., Disp: , Rfl:    ibuprofen (ADVIL) 200 MG tablet, Take 200-400 mg by mouth every 6 (six) hours as needed for moderate pain., Disp: , Rfl:    levothyroxine (SYNTHROID) 75 MCG tablet, TAKE 1 TABLET BY MOUTH ONCE DAILY BEFORE BREAKFAST, Disp: 90 tablet, Rfl: 0   Allergies: No Known Allergies  REVIEW OF SYSTEMS:   Review of Systems  Constitutional:  Negative for chills, fatigue and fever.  HENT:   Negative for lump/mass, mouth sores, nosebleeds, sore throat and trouble swallowing.   Eyes:  Negative for eye problems.  Respiratory:  Negative for cough and shortness of breath.   Cardiovascular:  Negative for chest pain, leg swelling and palpitations.  Gastrointestinal:  Negative for abdominal pain, constipation, diarrhea, nausea and vomiting.  Genitourinary:  Positive for bladder incontinence. Negative for difficulty urinating, dysuria, frequency, hematuria and nocturia.   Musculoskeletal:  Negative for arthralgias, back pain, flank pain, myalgias and neck pain.  Skin:  Negative for itching and rash.  Neurological:  Negative for dizziness, headaches and numbness.  Hematological:  Does not bruise/bleed easily.  Psychiatric/Behavioral:  Negative for depression, sleep disturbance and suicidal ideas. The patient is not nervous/anxious.   All other systems reviewed and are negative.    VITALS:   Blood pressure (!) 142/79, pulse (!) 56, temperature 97.7 F (36.5 C), temperature source Oral, resp. rate 16, weight 118 lb (53.5 kg), SpO2 100 %.  Wt Readings from Last 3 Encounters:  06/14/23 118 lb (53.5 kg)  01/18/23 119 lb 6.4 oz (54.2 kg)  12/14/22 117 lb (53.1 kg)    Body mass index is 22.3 kg/m.  Performance status (ECOG): 1 - Symptomatic but completely ambulatory  PHYSICAL EXAM:   Physical Exam Vitals and nursing note reviewed. Exam conducted with a chaperone present.  Constitutional:      Appearance: Normal appearance.  Cardiovascular:     Rate and Rhythm: Normal rate and  regular rhythm.     Pulses: Normal pulses.     Heart sounds: Normal heart sounds.  Pulmonary:     Effort: Pulmonary effort is normal.     Breath sounds: Normal breath sounds.  Abdominal:     Palpations: Abdomen is soft. There is no hepatomegaly, splenomegaly or mass.     Tenderness: There is no abdominal  tenderness.  Musculoskeletal:     Right lower leg: No edema.     Left lower leg: No edema.  Lymphadenopathy:     Cervical: No cervical adenopathy.     Right cervical: No superficial, deep or posterior cervical adenopathy.    Left cervical: No superficial, deep or posterior cervical adenopathy.     Upper Body:     Right upper body: No supraclavicular or axillary adenopathy.     Left upper body: No supraclavicular or axillary adenopathy.  Neurological:     General: No focal deficit present.     Mental Status: She is alert and oriented to person, place, and time.  Psychiatric:        Mood and Affect: Mood normal.        Behavior: Behavior normal.     LABS:      Latest Ref Rng & Units 06/14/2023    9:54 AM 12/07/2022   10:01 AM 06/08/2022   10:28 AM  CBC  WBC 4.0 - 10.5 K/uL 4.6  4.4  5.5   Hemoglobin 12.0 - 15.0 g/dL 16.1  09.6  04.5   Hematocrit 36.0 - 46.0 % 38.0  36.1  38.5   Platelets 150 - 400 K/uL 274  252  273       Latest Ref Rng & Units 06/14/2023    9:54 AM 12/07/2022   10:01 AM 11/04/2022   10:43 AM  CMP  Glucose 70 - 99 mg/dL 409  97  92   BUN 8 - 23 mg/dL 18  18  16    Creatinine 0.44 - 1.00 mg/dL 8.11  9.14  7.82   Sodium 135 - 145 mmol/L 139  140  138   Potassium 3.5 - 5.1 mmol/L 4.0  4.1  3.6   Chloride 98 - 111 mmol/L 100  104  103   CO2 22 - 32 mmol/L 30  30  29    Calcium 8.9 - 10.3 mg/dL 9.2  9.1  8.8   Total Protein 6.5 - 8.1 g/dL 6.8  6.5    Total Bilirubin 0.3 - 1.2 mg/dL 0.7  0.7    Alkaline Phos 38 - 126 U/L 77  70    AST 15 - 41 U/L 20  20    ALT 0 - 44 U/L 13  14       No results found for: "CEA1", "CEA" / No results found for: "CEA1",  "CEA" No results found for: "PSA1" No results found for: "NFA213" No results found for: "CAN125"  No results found for: "TOTALPROTELP", "ALBUMINELP", "A1GS", "A2GS", "BETS", "BETA2SER", "GAMS", "MSPIKE", "SPEI" No results found for: "TIBC", "FERRITIN", "IRONPCTSAT" No results found for: "LDH"   STUDIES:   No results found.

## 2023-06-14 ENCOUNTER — Inpatient Hospital Stay: Payer: Medicare Other | Attending: Hematology

## 2023-06-14 ENCOUNTER — Inpatient Hospital Stay (HOSPITAL_BASED_OUTPATIENT_CLINIC_OR_DEPARTMENT_OTHER): Payer: Medicare Other | Admitting: Hematology

## 2023-06-14 VITALS — BP 142/79 | HR 56 | Temp 97.7°F | Resp 16 | Wt 118.0 lb

## 2023-06-14 DIAGNOSIS — Z808 Family history of malignant neoplasm of other organs or systems: Secondary | ICD-10-CM | POA: Insufficient documentation

## 2023-06-14 DIAGNOSIS — G629 Polyneuropathy, unspecified: Secondary | ICD-10-CM | POA: Diagnosis not present

## 2023-06-14 DIAGNOSIS — Z803 Family history of malignant neoplasm of breast: Secondary | ICD-10-CM | POA: Insufficient documentation

## 2023-06-14 DIAGNOSIS — C211 Malignant neoplasm of anal canal: Secondary | ICD-10-CM | POA: Diagnosis not present

## 2023-06-14 DIAGNOSIS — Z800 Family history of malignant neoplasm of digestive organs: Secondary | ICD-10-CM | POA: Diagnosis not present

## 2023-06-14 DIAGNOSIS — C73 Malignant neoplasm of thyroid gland: Secondary | ICD-10-CM | POA: Insufficient documentation

## 2023-06-14 LAB — CBC WITH DIFFERENTIAL/PLATELET
Abs Immature Granulocytes: 0.01 10*3/uL (ref 0.00–0.07)
Basophils Absolute: 0.1 10*3/uL (ref 0.0–0.1)
Basophils Relative: 2 %
Eosinophils Absolute: 0.1 10*3/uL (ref 0.0–0.5)
Eosinophils Relative: 2 %
HCT: 38 % (ref 36.0–46.0)
Hemoglobin: 12.4 g/dL (ref 12.0–15.0)
Immature Granulocytes: 0 %
Lymphocytes Relative: 31 %
Lymphs Abs: 1.4 10*3/uL (ref 0.7–4.0)
MCH: 29.4 pg (ref 26.0–34.0)
MCHC: 32.6 g/dL (ref 30.0–36.0)
MCV: 90 fL (ref 80.0–100.0)
Monocytes Absolute: 0.5 10*3/uL (ref 0.1–1.0)
Monocytes Relative: 12 %
Neutro Abs: 2.5 10*3/uL (ref 1.7–7.7)
Neutrophils Relative %: 53 %
Platelets: 274 10*3/uL (ref 150–400)
RBC: 4.22 MIL/uL (ref 3.87–5.11)
RDW: 13.2 % (ref 11.5–15.5)
WBC: 4.6 10*3/uL (ref 4.0–10.5)
nRBC: 0 % (ref 0.0–0.2)

## 2023-06-14 LAB — COMPREHENSIVE METABOLIC PANEL
ALT: 13 U/L (ref 0–44)
AST: 20 U/L (ref 15–41)
Albumin: 4 g/dL (ref 3.5–5.0)
Alkaline Phosphatase: 77 U/L (ref 38–126)
Anion gap: 9 (ref 5–15)
BUN: 18 mg/dL (ref 8–23)
CO2: 30 mmol/L (ref 22–32)
Calcium: 9.2 mg/dL (ref 8.9–10.3)
Chloride: 100 mmol/L (ref 98–111)
Creatinine, Ser: 1.22 mg/dL — ABNORMAL HIGH (ref 0.44–1.00)
GFR, Estimated: 49 mL/min — ABNORMAL LOW (ref >60)
Glucose, Bld: 110 mg/dL — ABNORMAL HIGH (ref 70–99)
Potassium: 4 mmol/L (ref 3.5–5.1)
Sodium: 139 mmol/L (ref 135–145)
Total Bilirubin: 0.7 mg/dL (ref 0.3–1.2)
Total Protein: 6.8 g/dL (ref 6.5–8.1)

## 2023-06-14 NOTE — Patient Instructions (Addendum)
[facility]  Discharge Instructions  You were seen and examined today by Dr. Katragadda.  Your labs are stable.  Follow-up as scheduled.  Thank you for choosing [facility] to provide your oncology and hematology care.   To afford each patient quality time with our provider, please arrive at least 15 minutes before your scheduled appointment time. You may need to reschedule your appointment if you arrive late (10 or more minutes). Arriving late affects you and other patients whose appointments are after yours.  Also, if you miss three or more appointments without notifying the office, you may be dismissed from the clinic at the provider's discretion.    Again, thank you for choosing [facility].  Our hope is that these requests will decrease the amount of time that you wait before being seen by our physicians.   If you have a lab appointment with the Cancer Center - please note that after April 8th, all labs will be drawn in the cancer center.  You do not have to check in or register with the main entrance as you have in the past but will complete your check-in at the cancer center.            _____________________________________________________________  Should you have questions after your visit to [facility], please contact our office at [phone] and follow the prompts.  Our office hours are 8:00 a.m. to 4:30 p.m. Monday - Thursday and 8:00 a.m. to 2:30 p.m. Friday.  Please note that voicemails left after 4:00 p.m. may not be returned until the following business day.  We are closed weekends and all major holidays.  You do have access to a nurse 24-7, just call the main number to the clinic [phone] and do not press any options, hold on the line and a nurse will answer the phone.    For prescription refill requests, have your pharmacy contact our office and allow 72 hours.    Masks are no longer  required in the cancer centers. If you would like for your care team to wear a mask while they are taking care of you, please let them know. You may have one support person who is at least 67 years old accompany you for your appointments.  

## 2023-06-21 ENCOUNTER — Ambulatory Visit: Payer: Medicare Other | Admitting: Hematology

## 2023-06-21 ENCOUNTER — Other Ambulatory Visit: Payer: Medicare Other

## 2023-07-28 ENCOUNTER — Ambulatory Visit (INDEPENDENT_AMBULATORY_CARE_PROVIDER_SITE_OTHER): Payer: Medicare Other | Admitting: "Endocrinology

## 2023-07-28 ENCOUNTER — Encounter: Payer: Self-pay | Admitting: "Endocrinology

## 2023-07-28 VITALS — BP 124/66 | HR 64 | Ht 61.0 in | Wt 121.0 lb

## 2023-07-28 DIAGNOSIS — E890 Postprocedural hypothyroidism: Secondary | ICD-10-CM

## 2023-07-28 DIAGNOSIS — C73 Malignant neoplasm of thyroid gland: Secondary | ICD-10-CM

## 2023-07-28 NOTE — Progress Notes (Signed)
07/28/2023, 12:57 PM         Endocrinology follow-up note   Subjective:    Patient ID: Margaret Castaneda, female    DOB: 10/09/1956, PCP Aggie Cosier, MD   Past Medical History:  Diagnosis Date   Cancer [facility])    Anal Cancer, Thyroid Cancer   History of kidney stones    Hypertension    Neuropathy    Rectal mass 05/18/2018   Past Surgical History:  Procedure Laterality Date   COLONOSCOPY N/A 05/24/2018   Procedure: COLONOSCOPY;  Surgeon: Malissa Hippo, MD;  [facility];  Service: Endoscopy;  Laterality: N/A;  7:30   FLEXIBLE SIGMOIDOSCOPY N/A 03/02/2019   Procedure: FLEXIBLE SIGMOIDOSCOPY;  Surgeon: Malissa Hippo, MD;  [facility];  Service: Endoscopy;  Laterality: N/A;  245   FLEXIBLE SIGMOIDOSCOPY N/A 03/27/2020   Procedure: FLEXIBLE SIGMOIDOSCOPY;  Surgeon: Malissa Hippo, MD;  [facility];  Service: Endoscopy;  Laterality: N/A;  1145   FLEXIBLE SIGMOIDOSCOPY N/A 05/08/2021   Procedure: FLEXIBLE SIGMOIDOSCOPY;  Surgeon: Malissa Hippo, MD;  [facility];  Service: Endoscopy;  Laterality: N/A;  730   FLEXIBLE SIGMOIDOSCOPY N/A 11/06/2022   Procedure: FLEXIBLE SIGMOIDOSCOPY;  Surgeon: Dolores Frame, MD;  [facility];  Service: Gastroenterology;  Laterality: N/A;  730 ASA 2, pt knows to arrive at 6:30   reversal tubal ligation     SIGMOIDOSCOPY  01/2019   THYROIDECTOMY N/A 01/04/2019   Procedure: TOTAL THYROIDECTOMY;  Surgeon: Franky Macho, MD;  Location: [facility];  Service: General;  Laterality: N/A;   THYROIDECTOMY     TONSILLECTOMY     TUBAL LIGATION     Social History   Socioeconomic History   Marital status: Married    Spouse name: Not on file   Number of children: Not on file   Years of education: Not on file   Highest education level: Not on file  Occupational History   Not on file  Tobacco Use   Smoking status: Never    Passive  exposure: Past   Smokeless tobacco: Never  Vaping Use   Vaping status: Never Used  Substance and Sexual Activity   Alcohol use: Never   Drug use: Never   Sexual activity: Yes    Birth control/protection: Surgical  Other Topics Concern   Not on file  Social History Narrative   Not on file   Social Determinants of Health   Financial Resource Strain: Not on file  Food Insecurity: Not on file  Transportation Needs: Not on file  Physical Activity: Not on file  Stress: Not on file  Social Connections: Not on file   Outpatient Encounter Medications as of 07/28/2023  Medication Sig   acetaminophen (TYLENOL) 500 MG tablet Take 1 tablet (500 mg total) by mouth every 6 (six) hours as needed for moderate pain or headache.   benazepril (LOTENSIN) 10 MG tablet Take 10 mg by mouth in the morning.   diphenhydrAMINE (BENADRYL) 25 MG tablet Take 25 mg by mouth every 6 (six) hours as needed for allergies.    famotidine (PEPCID) 20 MG tablet Take 20 mg by mouth daily.   hydrochlorothiazide (MICROZIDE)  12.5 MG capsule Take 25 mg by mouth in the morning.   ibuprofen (ADVIL) 200 MG tablet Take 200-400 mg by mouth every 6 (six) hours as needed for moderate pain.   levothyroxine (SYNTHROID) 75 MCG tablet TAKE 1 TABLET BY MOUTH ONCE DAILY BEFORE BREAKFAST   No facility-administered encounter medications on file as of 07/28/2023.   ALLERGIES: No Known Allergies  VACCINATION STATUS: Immunization History  Administered Date(s) Administered   Influenza-Unspecified 11/01/2020   Moderna Sars-Covid-2 Vaccination 01/11/2020, 02/09/2020, 11/01/2020    HPI Margaret Castaneda is 67 y.o. 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  -She has no new complaints today. She is currently on Synthroid 75 mcg p.o. daily before breakfast.  She is tolerating this medication, previsit thyroid  function tests are consistent with appropriate replacement.    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  Subsequent biopsy of 0.7 cm nodule in the left lobe of the thyroid in November 2019 revealed papillary thyroid cancer.  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   Her most recent Thyrogen stimulated whole-body scan was negative for iodine avid recurrent thyroid cancer or distant metastasis on January 11, 2023.   Her previsit thyroid function tests are consistent with appropriate replacement.  -She is currently on levothyroxine 75 mcg p.o. daily before breakfast.  She reports compliance with her medications.  She feels better, has no new complaints today.    -She denies any prior exposure to neck radiation, has 1 sister with thyroid malignancy.  -She remains off of calcium.  Her most recent labs show calcium of 9.2 improving from 8.3  from immediately after her surgery. -She denies palpitations, heat intolerance, tremors.    Objective:    BP 124/66   Pulse 64   Ht 5\' 1"  (1.549 m)   Wt 121 lb (54.9 kg)   BMI 22.86 kg/m   Wt Readings from Last 3 Encounters:  07/28/23 121 lb (54.9 kg)  06/14/23 118 lb (53.5 kg)  01/18/23 119 lb 6.4 oz (54.2 kg)    Physical Exam   CMP     Component Value Date/Time   NA 139 06/14/2023 0954   K 4.0 06/14/2023 0954   CL 100 06/14/2023 0954   CO2 30 06/14/2023 0954   GLUCOSE 110 (H) 06/14/2023 0954   BUN 18 06/14/2023 0954   CREATININE 1.22 (H) 06/14/2023 0954   CREATININE 0.95 05/18/2018 1416   CALCIUM 9.2 06/14/2023 0954   PROT 6.8 06/14/2023 0954   ALBUMIN 4.0 06/14/2023 0954   AST 20 06/14/2023 0954   ALT 13 06/14/2023 0954   ALKPHOS 77 06/14/2023  0954   BILITOT 0.7 06/14/2023 0954   GFRNONAA 49 (L) 06/14/2023 0954   GFRAA >60 05/15/2020 1414    Lab Results  Component Value Date   TSH 306.854 (H) 01/08/2023   TSH 0.516 06/08/2022   TSH 2.55 11/17/2021   TSH 2.404 05/14/2021   TSH 3.57 02/17/2021   TSH 330.657 (H) 12/13/2020   TSH 1.62 08/07/2020   TSH 0.834 05/15/2020   TSH 0.03 (A) 09/18/2019   TSH 0.052 (L) 06/01/2019   FREET4  0.95 01/08/2023   FREET4 1.04 05/14/2021   FREET4 1.10 12/13/2020   FREET4 1.46 03/06/2019   FREET4 1.47 02/24/2019     Thyroid/neck ultrasound December 27, 2019 No regional cervical lymphadenopathy.   IMPRESSION: Post total thyroidectomy without evidence of residual or locally recurrent disease.    Most recent thyroid instability whole-body scan on December 16, 2020 IMPRESSION: Small focus of radio iodine accumulation in the RIGHT cervical region.   This is faint and nonspecific; this could represent residual tracer within the RIGHT thyroid bed, subtle nodal disease, or mild physiologic accumulation of tracer in the esophagus from salivary origin.  Recommend thyroid ultrasound and correlation with thyroglobulin Levels.  Recommended thyroid/neck ultrasound on May 14, 2021: No regional cervical lymphadenopathy.   IMPRESSION: Post total thyroidectomy without evidence of residual or locally recurrent disease.   Thyrogen stimulated whole-body scan on January 11, 2023 FINDINGS: No radiotracer activity in the thyroid bed. No evidence of abnormal radiotracer accumulation on whole-body I 131 scan.   Physiologic activity noted in the GI and GU tract.   IMPRESSION: No evidence of local thyroid cancer recurrence or distant metastasis.    Assessment & Plan:   1. Postsurgical hypothyroidism 2. Papillary carcinoma of thyroid (HCC) -Her previsit thyroid function tests are consistent with appropriate replacement.  She is advised to continue levothyroxine 75 mcg p.o. daily before  breakfast.    - We discussed about the correct intake of her thyroid hormone, on empty stomach at fasting, with water, separated by at least 30 minutes from breakfast and other medications,  and separated by more than 4 hours from calcium, iron, multivitamins, acid reflux medications (PPIs). -Patient is made aware of the fact that thyroid hormone replacement is needed for life, dose to be adjusted by periodic monitoring of thyroid function tests.  -She is status post I-131 thyroid remnant ablation with negative post therapy whole-body scan for distant metastasis.  -She has had detectable thyroglobulin level 23, with undetectable thyroglobulin antibodies.  -Her subsequent thyroid/neck ultrasound studies did not show evidence of thyroid remnant or tumor recurrence. Her physical exam today did not reveal any gross findings. Her most recent whole-body scan is negative for iodine avid recurrent thyroid cancer or distant metastasis.    - I advised her  to maintain close follow up with Aggie Cosier, MD for primary care needs.  I spent  21  minutes in the care of the patient today including review of labs from Thyroid Function, CMP, and other relevant labs ; imaging/biopsy records (current and previous including abstractions from other facilities); face-to-face time discussing  her lab results and symptoms, medications doses, her options of short and long term treatment based on the latest standards of care / guidelines;   and documenting the encounter.  Yehuda Savannah Wetherby  participated in the discussions, expressed understanding, and voiced agreement with the above plans.  All questions were answered to her satisfaction. she is encouraged to contact clinic should she have any questions or concerns prior to her return visit.   Follow up plan: Return in about 6 months (around 01/28/2024) for F/U with Pre-visit Labs.   Marquis Lunch, MD [facility] [address] [place], [place] [address] Phone: [phone]  Fax: [phone]     07/28/2023, 12:57 PM  This note was partially dictated with voice recognition software. Similar sounding words can be transcribed inadequately or may not  be corrected upon review.

## 2023-09-20 ENCOUNTER — Other Ambulatory Visit: Payer: Self-pay | Admitting: "Endocrinology

## 2023-12-24 ENCOUNTER — Other Ambulatory Visit: Payer: Self-pay | Admitting: "Endocrinology

## 2024-01-11 LAB — TSH: TSH: 1.01 (ref 0.41–5.90)

## 2024-01-11 LAB — THYROGLOBULIN ANTIBODY: Thyroglobulin Antibody: <1

## 2024-01-13 ENCOUNTER — Encounter: Payer: Self-pay | Admitting: "Endocrinology

## 2024-01-26 ENCOUNTER — Ambulatory Visit (INDEPENDENT_AMBULATORY_CARE_PROVIDER_SITE_OTHER): Payer: Medicare Other | Admitting: "Endocrinology

## 2024-01-26 ENCOUNTER — Encounter: Payer: Self-pay | Admitting: "Endocrinology

## 2024-01-26 VITALS — BP 126/78 | HR 72 | Ht 61.0 in | Wt 121.0 lb

## 2024-01-26 DIAGNOSIS — C73 Malignant neoplasm of thyroid gland: Secondary | ICD-10-CM

## 2024-01-26 DIAGNOSIS — E890 Postprocedural hypothyroidism: Secondary | ICD-10-CM

## 2024-01-26 NOTE — Progress Notes (Signed)
01/26/2024, 9:37 AM         Endocrinology follow-up note   Subjective:    Patient ID: Margaret Castaneda, female    DOB: 12/11/1956, PCP Aggie Cosier, MD   Past Medical History:  Diagnosis Date   Cancer [facility])    Anal Cancer, Thyroid Cancer   History of kidney stones    Hypertension    Neuropathy    Rectal mass 05/18/2018   Past Surgical History:  Procedure Laterality Date   COLONOSCOPY N/A 05/24/2018   Procedure: COLONOSCOPY;  Surgeon: Malissa Hippo, MD;  [facility];  Service: Endoscopy;  Laterality: N/A;  7:30   FLEXIBLE SIGMOIDOSCOPY N/A 03/02/2019   Procedure: FLEXIBLE SIGMOIDOSCOPY;  Surgeon: Malissa Hippo, MD;  [facility];  Service: Endoscopy;  Laterality: N/A;  245   FLEXIBLE SIGMOIDOSCOPY N/A 03/27/2020   Procedure: FLEXIBLE SIGMOIDOSCOPY;  Surgeon: Malissa Hippo, MD;  [facility];  Service: Endoscopy;  Laterality: N/A;  1145   FLEXIBLE SIGMOIDOSCOPY N/A 05/08/2021   Procedure: FLEXIBLE SIGMOIDOSCOPY;  Surgeon: Malissa Hippo, MD;  [facility];  Service: Endoscopy;  Laterality: N/A;  730   FLEXIBLE SIGMOIDOSCOPY N/A 11/06/2022   Procedure: FLEXIBLE SIGMOIDOSCOPY;  Surgeon: Dolores Frame, MD;  [facility];  Service: Gastroenterology;  Laterality: N/A;  730 ASA 2, pt knows to arrive at 6:30   reversal tubal ligation     SIGMOIDOSCOPY  01/2019   THYROIDECTOMY N/A 01/04/2019   Procedure: TOTAL THYROIDECTOMY;  Surgeon: Franky Macho, MD;  Location: [facility];  Service: General;  Laterality: N/A;   THYROIDECTOMY     TONSILLECTOMY     TUBAL LIGATION     Social History   Socioeconomic History   Marital status: Married    Spouse name: Not on file   Number of children: Not on file   Years of education: Not on file   Highest education level: Not on file  Occupational History   Not on file  Tobacco Use   Smoking status: Never    Passive  exposure: Past   Smokeless tobacco: Never  Vaping Use   Vaping status: Never Used  Substance and Sexual Activity   Alcohol use: Never   Drug use: Never   Sexual activity: Yes    Birth control/protection: Surgical  Other Topics Concern   Not on file  Social History Narrative   Not on file   Social Drivers of Health   Financial Resource Strain: Not on file  Food Insecurity: Not on file  Transportation Needs: Not on file  Physical Activity: Not on file  Stress: Not on file  Social Connections: Not on file   Outpatient Encounter Medications as of 01/26/2024  Medication Sig   acetaminophen (TYLENOL) 500 MG tablet Take 1 tablet (500 mg total) by mouth every 6 (six) hours as needed for moderate pain or headache.   amLODipine (NORVASC) 5 MG tablet Take 5 mg by mouth. Take 1 tablet Monday, Wednesday and Friday. Take 1/2 tablet on Tuesday, Thursdays, Saturday and Sunday   benazepril (LOTENSIN) 10 MG tablet Take 10 mg by mouth in the morning.   diphenhydrAMINE (BENADRYL) 25 MG tablet Take 25 mg  by mouth every 6 (six) hours as needed for allergies.    famotidine (PEPCID) 20 MG tablet Take 20 mg by mouth daily.   ibuprofen (ADVIL) 200 MG tablet Take 200-400 mg by mouth every 6 (six) hours as needed for moderate pain.   levothyroxine (SYNTHROID) 75 MCG tablet TAKE 1 TABLET BY MOUTH ONCE DAILY BEFORE BREAKFAST   [DISCONTINUED] hydrochlorothiazide (MICROZIDE) 12.5 MG capsule Take 25 mg by mouth in the morning.   No facility-administered encounter medications on file as of 01/26/2024.   ALLERGIES: No Known Allergies  VACCINATION STATUS: Immunization History  Administered Date(s) Administered   Influenza-Unspecified 11/01/2020   Moderna Sars-Covid-2 Vaccination 01/11/2020, 02/09/2020, 11/01/2020    HPI Margaret Castaneda is 68 y.o. female who is engaged in telephone visit for follow-up of postsurgical hypothyroidism after she underwent total thyroidectomy for thyroid malignancy prior to her  last visit.  -She has no new complaints today. She is currently on Synthroid 75 mcg p.o. daily before breakfast.  She is tolerating this medication, previsit thyroid function tests are consistent with appropriate replacement.    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  Subsequent biopsy of 0.7 cm nodule in the left lobe of the thyroid in November 2019 revealed papillary thyroid cancer.  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   Her most recent Thyrogen stimulated whole-body scan was negative for iodine avid recurrent thyroid cancer or distant metastasis on January 11, 2023.    Her labs did not include thyroglobulin levels.  Her previsit thyroid function tests are consistent with appropriate suppressive replacement.  -She is currently on levothyroxine 75 mcg p.o. daily before breakfast.  She reports compliance with her medications.  She feels better, has no new complaints today.    -She denies any prior exposure to neck radiation, has 1 sister with thyroid malignancy.  -She remains off of calcium.  Her most recent labs show calcium of 9.2 improving from 8.3  from immediately after her surgery. -She denies palpitations, heat intolerance, tremors.    Objective:    BP (!) 142/84   Pulse 72   Ht 5\' 1"  (1.549 m)   Wt 121 lb (54.9 kg)   BMI 22.86 kg/m   Wt Readings from Last 3 Encounters:  01/26/24 121 lb (54.9 kg)  07/28/23 121 lb (54.9 kg)  06/14/23 118 lb (53.5 kg)    Physical Exam   CMP     Component Value Date/Time   NA 139 06/14/2023 0954   K 4.0 06/14/2023 0954   CL 100 06/14/2023 0954   CO2 30 06/14/2023 0954   GLUCOSE 110 (H) 06/14/2023 0954   BUN 18 06/14/2023 0954    CREATININE 1.22 (H) 06/14/2023 0954   CREATININE 0.95 05/18/2018 1416   CALCIUM 9.2 06/14/2023 0954   PROT 6.8 06/14/2023 0954   ALBUMIN 4.0 06/14/2023 0954   AST 20 06/14/2023 0954   ALT 13 06/14/2023 0954   ALKPHOS 77 06/14/2023 0954   BILITOT 0.7 06/14/2023 0954   GFRNONAA 49 (L) 06/14/2023 0954   GFRAA >60 05/15/2020 1414    Lab Results  Component Value Date   TSH 1.01 01/11/2024   TSH 306.854 (H) 01/08/2023   TSH 0.516 06/08/2022   TSH  2.55 11/17/2021   TSH 2.404 05/14/2021   TSH 3.57 02/17/2021   TSH 330.657 (H) 12/13/2020   TSH 1.62 08/07/2020   TSH 0.834 05/15/2020   TSH 0.03 (A) 09/18/2019   FREET4 0.95 01/08/2023   FREET4 1.04 05/14/2021   FREET4 1.10 12/13/2020   FREET4 1.46 03/06/2019   FREET4 1.47 02/24/2019     Thyroid/neck ultrasound December 27, 2019 No regional cervical lymphadenopathy.   IMPRESSION: Post total thyroidectomy without evidence of residual or locally recurrent disease.    Most recent thyroid instability whole-body scan on December 16, 2020 IMPRESSION: Small focus of radio iodine accumulation in the RIGHT cervical region.   This is faint and nonspecific; this could represent residual tracer within the RIGHT thyroid bed, subtle nodal disease, or mild physiologic accumulation of tracer in the esophagus from salivary origin.  Recommend thyroid ultrasound and correlation with thyroglobulin Levels.  Recommended thyroid/neck ultrasound on May 14, 2021: No regional cervical lymphadenopathy.   IMPRESSION: Post total thyroidectomy without evidence of residual or locally recurrent disease.   Thyrogen stimulated whole-body scan on January 11, 2023 FINDINGS: No radiotracer activity in the thyroid bed. No evidence of abnormal radiotracer accumulation on whole-body I 131 scan.   Physiologic activity noted in the GI and GU tract.   IMPRESSION: No evidence of local thyroid cancer recurrence or distant metastasis.    Assessment &  Plan:   1. 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  She is advised to continue levothyroxine 75 mcg p.o. daily before breakfast.   - We discussed about the correct intake of her thyroid hormone, on empty stomach at fasting, with water, separated by at least 30 minutes from breakfast and other medications,  and separated by more than 4 hours from calcium, iron, multivitamins, acid reflux medications (PPIs). -Patient is made aware of the fact that thyroid hormone replacement is needed for life, dose to be adjusted by periodic monitoring of thyroid function tests.   -She is status post I-131 thyroid remnant ablation with negative post therapy whole-body scan for distant metastasis.  -She has had detectable thyroglobulin level 23, with undetectable thyroglobulin antibodies.  -Her subsequent thyroid/neck ultrasound studies did not show evidence of thyroid remnant or tumor recurrence.  Her most recent whole-body scan is negative for iodine avid recurrent thyroid cancer or distant metastasis.   Her next labs should include thyroglobulin levels along with thyroglobulin bodies.  Her initial blood pressure was elevated at 142/84, after resting for 10 minutes her blood pressure was improved to 126/78.  Patient is currently on benazepril 10 mg p.o. daily and amlodipine 2.5-5 mg p.o. daily.  - I advised her  to maintain close follow up with Aggie Cosier, MD for primary care needs.    I spent  21  minutes in the care of the patient today including review of labs from Thyroid Function, CMP, and other relevant labs ; imaging/biopsy records (current and previous including abstractions from other facilities); face-to-face time discussing  her lab results and symptoms, medications doses, her options of short and long term treatment based on the latest standards of care / guidelines;    and documenting the encounter.  Margaret Castaneda  participated in the discussions, expressed understanding, and voiced agreement with the above plans.  All questions were answered to her satisfaction. she is encouraged to contact clinic should she have any questions or concerns prior to her return visit.  Follow up plan: Return in about 4 months (around 05/25/2024) for F/U with Pre-visit Labs.   Marquis Lunch, MD [facility] [address] [place], [place] [address] Phone: [phone]  Fax: [phone]     01/26/2024, 9:37 AM  This note was partially dictated with voice recognition software. Similar sounding words can be transcribed inadequately or may not  be corrected upon review.

## 2024-03-21 ENCOUNTER — Other Ambulatory Visit: Payer: Self-pay | Admitting: "Endocrinology

## 2024-05-26 ENCOUNTER — Ambulatory Visit: Payer: Medicare Other | Admitting: "Endocrinology

## 2024-05-30 ENCOUNTER — Encounter: Payer: Self-pay | Admitting: "Endocrinology

## 2024-05-30 LAB — LAB REPORT - SCANNED
Free T4: 1.51 ng/dL
TSH: 0.42 (ref 0.41–5.90)

## 2024-06-05 ENCOUNTER — Ambulatory Visit (INDEPENDENT_AMBULATORY_CARE_PROVIDER_SITE_OTHER): Admitting: "Endocrinology

## 2024-06-05 ENCOUNTER — Encounter: Payer: Self-pay | Admitting: "Endocrinology

## 2024-06-05 VITALS — BP 144/68 | HR 72 | Ht 61.0 in | Wt 121.2 lb

## 2024-06-05 DIAGNOSIS — C73 Malignant neoplasm of thyroid gland: Secondary | ICD-10-CM | POA: Diagnosis not present

## 2024-06-05 DIAGNOSIS — E890 Postprocedural hypothyroidism: Secondary | ICD-10-CM

## 2024-06-05 MED ORDER — LEVOTHYROXINE SODIUM 75 MCG PO TABS: 75.0000 ug | ORAL_TABLET | Freq: Every day | ORAL | 1 refills | Status: DC

## 2024-06-05 NOTE — Progress Notes (Signed)
 06/05/2024, 9:48 AM         Endocrinology follow-up note   Subjective:    Patient ID: Margaret Castaneda, female    DOB: 1956-04-09, PCP Tressie Fryer, MD   Past Medical History:  Diagnosis Date   Cancer [facility])    Anal Cancer, Thyroid  Cancer   History of kidney stones    Hypertension    Neuropathy    Rectal mass 05/18/2018   Past Surgical History:  Procedure Laterality Date   COLONOSCOPY N/A 05/24/2018   Procedure: COLONOSCOPY;  Surgeon: Ruby Corporal, MD;  [facility];  Service: Endoscopy;  Laterality: N/A;  7:30   FLEXIBLE SIGMOIDOSCOPY N/A 03/02/2019   Procedure: FLEXIBLE SIGMOIDOSCOPY;  Surgeon: Ruby Corporal, MD;  [facility];  Service: Endoscopy;  Laterality: N/A;  245   FLEXIBLE SIGMOIDOSCOPY N/A 03/27/2020   Procedure: FLEXIBLE SIGMOIDOSCOPY;  Surgeon: Ruby Corporal, MD;  [facility];  Service: Endoscopy;  Laterality: N/A;  1145   FLEXIBLE SIGMOIDOSCOPY N/A 05/08/2021   Procedure: FLEXIBLE SIGMOIDOSCOPY;  Surgeon: Ruby Corporal, MD;  [facility];  Service: Endoscopy;  Laterality: N/A;  730   FLEXIBLE SIGMOIDOSCOPY N/A 11/06/2022   Procedure: FLEXIBLE SIGMOIDOSCOPY;  Surgeon: Urban Garden, MD;  [facility];  Service: Gastroenterology;  Laterality: N/A;  730 ASA 2, pt knows to arrive at 6:30   reversal tubal ligation     SIGMOIDOSCOPY  01/2019   THYROIDECTOMY N/A 01/04/2019   Procedure: TOTAL THYROIDECTOMY;  Surgeon: Alanda Allegra, MD;  Location: [facility];  Service: General;  Laterality: N/A;   THYROIDECTOMY     TONSILLECTOMY     TUBAL LIGATION     Social History   Socioeconomic History   Marital status: Married    Spouse name: Not on file   Number of children: Not on file   Years of education: Not on file   Highest education level: Not on file  Occupational History   Not on file  Tobacco Use   Smoking status: Never    Passive  exposure: Past   Smokeless tobacco: Never  Vaping Use   Vaping status: Never Used  Substance and Sexual Activity   Alcohol use: Never   Drug use: Never   Sexual activity: Yes    Birth control/protection: Surgical  Other Topics Concern   Not on file  Social History Narrative   Not on file   Social Drivers of Health   Financial Resource Strain: Not on file  Food Insecurity: Not on file  Transportation Needs: Not on file  Physical Activity: Not on file  Stress: Not on file  Social Connections: Not on file   Outpatient Encounter Medications as of 06/05/2024  Medication Sig   acetaminophen  (TYLENOL ) 500 MG tablet Take 1 tablet (500 mg total) by mouth every 6 (six) hours as needed for moderate pain or headache.   amLODipine  (NORVASC ) 5 MG tablet Take 5 mg by mouth. Take 1 tablet Monday, Wednesday and Friday. Take 1/2 tablet on Tuesday, Thursdays, Saturday and Sunday   benazepril  (LOTENSIN ) 10 MG tablet Take 10 mg by mouth in the morning.   diphenhydrAMINE  (BENADRYL ) 25 MG tablet Take 25 mg  by mouth every 6 (six) hours as needed for allergies.    famotidine  (PEPCID ) 20 MG tablet Take 20 mg by mouth daily.   ibuprofen (ADVIL) 200 MG tablet Take 200-400 mg by mouth every 6 (six) hours as needed for moderate pain.   levothyroxine  (SYNTHROID ) 75 MCG tablet Take 1 tablet (75 mcg total) by mouth daily before breakfast.   [DISCONTINUED] levothyroxine  (SYNTHROID ) 75 MCG tablet TAKE 1 TABLET BY MOUTH ONCE DAILY BEFORE BREAKFAST   No facility-administered encounter medications on file as of 06/05/2024.   ALLERGIES: No Known Allergies  VACCINATION STATUS: Immunization History  Administered Date(s) Administered   Influenza-Unspecified 11/01/2020   Moderna Sars-Covid-2 Vaccination 01/11/2020, 02/09/2020, 11/01/2020    HPI Margaret Castaneda is 68 y.o. female who is engaged in telephone visit for follow-up of postsurgical hypothyroidism after she underwent total thyroidectomy for thyroid   malignancy prior to her last visit.  -She has no new complaints today. She is currently on Synthroid  75 mcg p.o. daily before breakfast.  She is tolerating this medication, previsit thyroid  function tests are consistent with appropriate replacement.    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  Subsequent biopsy of 0.7 cm nodule in the left lobe of the thyroid  in November 2019 revealed papillary thyroid  cancer.  On January 04, 2019 she underwent total thyroidectomy which revealed 2 foci of malignancy in the right lobe of her thyroid : 0.6 cm papillary thyroid  cancer and 0.1 cm follicular variant papillary thyroid  cancer.   Her most recent Thyrogen  stimulated whole-body scan was negative for iodine avid recurrent thyroid  cancer or distant metastasis on January 11, 2023.    Her labs from June 2025 did not include thyroglobulin Castaneda  She denies any new complaints today.  Her previsit thyroid  function tests are consistent with appropriate suppressive replacement.  -She is currently on levothyroxine  75 mg p.o. daily before breakfast.   She reports compliance with her medications.  She feels better, has no new complaints today.    -She denies any prior exposure to neck radiation, has 1 sister with thyroid  malignancy.  -She remains off of calcium .  Her most recent labs show calcium  of 9.2 improving from 8.3  from immediately after her surgery. -She denies palpitations, heat intolerance, tremors.    Objective:    BP (!) 154/82   Pulse 72   Ht 5\' 1"  (1.549 m)   Wt 121 lb 3.2 oz (55 kg)   BMI 22.90 kg/m   Wt Readings from Last 3 Encounters:  06/05/24 121 lb 3.2 oz (55 kg)  01/26/24 121 lb (54.9 kg)  07/28/23 121 lb (54.9 kg)    Physical Exam   CMP     Component Value Date/Time   NA 139 06/14/2023 0954   K 4.0 06/14/2023 0954   CL 100 06/14/2023 0954   CO2 30  06/14/2023 0954   GLUCOSE 110 (H) 06/14/2023 0954   BUN 18 06/14/2023 0954   CREATININE 1.22 (H) 06/14/2023 0954   CREATININE 0.95 05/18/2018 1416   CALCIUM  9.2 06/14/2023 0954   PROT 6.8 06/14/2023 0954   ALBUMIN 4.0 06/14/2023 0954   AST 20 06/14/2023 0954   ALT 13 06/14/2023 0954   ALKPHOS 77 06/14/2023 0954   BILITOT 0.7 06/14/2023 0954   GFRNONAA 49 (L) 06/14/2023 0954   GFRAA >60 05/15/2020 1414    Lab Results  Component Value Date  TSH 0.42 05/30/2024   TSH 1.01 01/11/2024   TSH 306.854 (H) 01/08/2023   TSH 0.516 06/08/2022   TSH 2.55 11/17/2021   TSH 2.404 05/14/2021   TSH 3.57 02/17/2021   TSH 330.657 (H) 12/13/2020   TSH 1.62 08/07/2020   TSH 0.834 05/15/2020   FREET4 1.51 05/30/2024   FREET4 0.95 01/08/2023   FREET4 1.04 05/14/2021   FREET4 1.10 12/13/2020   FREET4 1.46 03/06/2019   FREET4 1.47 02/24/2019     Thyroid /neck ultrasound December 27, 2019 No regional cervical lymphadenopathy.   IMPRESSION: Post total thyroidectomy without evidence of residual or locally recurrent disease.    Most recent thyroid  instability whole-body scan on December 16, 2020 IMPRESSION: Small focus of radio iodine accumulation in the RIGHT cervical region.   This is faint and nonspecific; this could represent residual tracer within the RIGHT thyroid  bed, subtle nodal disease, or mild physiologic accumulation of tracer in the esophagus from salivary origin.  Recommend thyroid  ultrasound and correlation with thyroglobulin Castaneda.  Recommended thyroid /neck ultrasound on May 14, 2021: No regional cervical lymphadenopathy.   IMPRESSION: Post total thyroidectomy without evidence of residual or locally recurrent disease.   Thyrogen  stimulated whole-body scan on January 11, 2023 FINDINGS: No radiotracer activity in the thyroid  bed. No evidence of abnormal radiotracer accumulation on whole-body I 131 scan.   Physiologic activity noted in the GI and GU tract.    IMPRESSION: No evidence of local thyroid  cancer recurrence or distant metastasis.    Assessment & Plan:   1. Postsurgical hypothyroidism 2. Papillary carcinoma of thyroid  (HCC) -Her previsit thyroid  function tests are consistent with appropriate suppressive treatment.  She is advised to continue levothyroxine  75 mcg p.o. daily before breakfast.     - We discussed about the correct intake of her thyroid  hormone, on empty stomach at fasting, with water , separated by at least 30 minutes from breakfast and other medications,  and separated by more than 4 hours from calcium , iron, multivitamins, acid reflux medications (PPIs). -Patient is made aware of the fact that thyroid  hormone replacement is needed for life, dose to be adjusted by periodic monitoring of thyroid  function tests.   -She is status post I-131 thyroid  remnant ablation with negative post therapy whole-body scan for distant metastasis.  -She has had detectable thyroglobulin level 23, with undetectable thyroglobulin antibodies.  -Her subsequent thyroid /neck ultrasound studies did not show evidence of thyroid  remnant or tumor recurrence.  Her most recent whole-body scan is negative for iodine avid recurrent thyroid  cancer or distant metastasis.   Her next labs should include thyroglobulin Castaneda along with thyroglobulin bodies.  -She registers elevated blood pressure at check-in which improves after she stays in the clinic.  She has amlodipine  5 mg p.o. daily, and benazepril  10 mg p.o. daily.  She took her medications this morning.  Blood pressure medication adjustment is deferred to her PCP.   - I advised her  to maintain close follow up with Tressie Fryer, MD for primary care needs.   I spent  22  minutes in the care of the patient today including review of labs from Thyroid  Function, CMP, and other relevant labs ; imaging/biopsy records (current and previous including abstractions from other facilities); face-to-face time  discussing  her lab results and symptoms, medications doses, her options of short and long term treatment based on the latest standards of care / guidelines;   and documenting the encounter.  Pamela Boeck Kussman  participated in the discussions, expressed understanding, and voiced agreement  with the above plans.  All questions were answered to her satisfaction. she is encouraged to contact clinic should she have any questions or concerns prior to her return visit.   Follow up plan: Return in about 6 months (around 12/05/2024) for F/U with Pre-visit Labs.   Kalvin Orf, MD [facility] [address] [place], [place] [address] Phone: [phone]  Fax: [phone]     06/05/2024, 9:48 AM  This note was partially dictated with voice recognition software. Similar sounding words can be transcribed inadequately or may not  be corrected upon review.

## 2024-06-12 ENCOUNTER — Inpatient Hospital Stay: Payer: Medicare Other

## 2024-06-12 ENCOUNTER — Inpatient Hospital Stay: Payer: Medicare Other | Attending: Hematology | Admitting: Hematology

## 2024-06-12 VITALS — BP 144/74 | HR 70 | Temp 97.5°F | Resp 16 | Wt 120.6 lb

## 2024-06-12 DIAGNOSIS — C211 Malignant neoplasm of anal canal: Secondary | ICD-10-CM | POA: Diagnosis not present

## 2024-06-12 DIAGNOSIS — Z85850 Personal history of malignant neoplasm of thyroid: Secondary | ICD-10-CM | POA: Diagnosis not present

## 2024-06-12 DIAGNOSIS — Z85048 Personal history of other malignant neoplasm of rectum, rectosigmoid junction, and anus: Secondary | ICD-10-CM | POA: Diagnosis present

## 2024-06-12 DIAGNOSIS — Z79890 Hormone replacement therapy (postmenopausal): Secondary | ICD-10-CM | POA: Diagnosis not present

## 2024-06-12 DIAGNOSIS — Z923 Personal history of irradiation: Secondary | ICD-10-CM | POA: Insufficient documentation

## 2024-06-12 DIAGNOSIS — Z9221 Personal history of antineoplastic chemotherapy: Secondary | ICD-10-CM | POA: Insufficient documentation

## 2024-06-12 DIAGNOSIS — G629 Polyneuropathy, unspecified: Secondary | ICD-10-CM | POA: Insufficient documentation

## 2024-06-12 LAB — COMPREHENSIVE METABOLIC PANEL WITH GFR
ALT: 13 U/L (ref 0–44)
AST: 21 U/L (ref 15–41)
Albumin: 3.7 g/dL (ref 3.5–5.0)
Alkaline Phosphatase: 119 U/L (ref 38–126)
Anion gap: 8 (ref 5–15)
BUN: 13 mg/dL (ref 8–23)
CO2: 28 mmol/L (ref 22–32)
Calcium: 8.8 mg/dL — ABNORMAL LOW (ref 8.9–10.3)
Chloride: 105 mmol/L (ref 98–111)
Creatinine, Ser: 0.98 mg/dL (ref 0.44–1.00)
GFR, Estimated: >60 mL/min (ref >60)
Glucose, Bld: 121 mg/dL — ABNORMAL HIGH (ref 70–99)
Potassium: 3.8 mmol/L (ref 3.5–5.1)
Sodium: 141 mmol/L (ref 135–145)
Total Bilirubin: 0.7 mg/dL (ref 0.0–1.2)
Total Protein: 6.6 g/dL (ref 6.5–8.1)

## 2024-06-12 LAB — CBC WITH DIFFERENTIAL/PLATELET
Abs Immature Granulocytes: 0.01 10*3/uL (ref 0.00–0.07)
Basophils Absolute: 0.1 10*3/uL (ref 0.0–0.1)
Basophils Relative: 1 %
Eosinophils Absolute: 0.2 10*3/uL (ref 0.0–0.5)
Eosinophils Relative: 3 %
HCT: 37 % (ref 36.0–46.0)
Hemoglobin: 12.2 g/dL (ref 12.0–15.0)
Immature Granulocytes: 0 %
Lymphocytes Relative: 27 %
Lymphs Abs: 1.7 10*3/uL (ref 0.7–4.0)
MCH: 29.6 pg (ref 26.0–34.0)
MCHC: 33 g/dL (ref 30.0–36.0)
MCV: 89.8 fL (ref 80.0–100.0)
Monocytes Absolute: 0.5 10*3/uL (ref 0.1–1.0)
Monocytes Relative: 7 %
Neutro Abs: 3.8 10*3/uL (ref 1.7–7.7)
Neutrophils Relative %: 62 %
Platelets: 280 10*3/uL (ref 150–400)
RBC: 4.12 MIL/uL (ref 3.87–5.11)
RDW: 13.7 % (ref 11.5–15.5)
WBC: 6.3 10*3/uL (ref 4.0–10.5)
nRBC: 0 % (ref 0.0–0.2)

## 2024-06-12 NOTE — Patient Instructions (Addendum)
 [facility] at [facility] Discharge Instructions   You were seen and examined today by Dr. Cheree Cords.  He reviewed the results of your lab work which are normal/stable.   You do not require any further follow up here at this time. You may always reach out to us  should you need anything in the future.   Return as needed.    Thank you for choosing [facility] at [facility] to provide your oncology and hematology care.  To afford each patient quality time with our provider, please arrive at least 15 minutes before your scheduled appointment time.   If you have a lab appointment with the Cancer Center please come in thru the Main Entrance and check in at the main information desk.  You need to re-schedule your appointment should you arrive 10 or more minutes late.  We strive to give you quality time with our providers, and arriving late affects you and other patients whose appointments are after yours.  Also, if you no show three or more times for appointments you may be dismissed from the clinic at the providers discretion.     Again, thank you for choosing [facility].  Our hope is that these requests will decrease the amount of time that you wait before being seen by our physicians.       _____________________________________________________________  Should you have questions after your visit to [facility], please contact our office at [phone] and follow the prompts.  Our office hours are 8:00 a.m. and 4:30 p.m. Monday - Friday.  Please note that voicemails left after 4:00 p.m. may not be returned until the following business day.  We are closed weekends and major holidays.  You do have access to a nurse 24-7, just call the main number to the clinic [phone] and do not press any options, hold on the line and a nurse will answer the phone.    For prescription refill requests, have your pharmacy contact our  office and allow 72 hours.    Due to Covid, you will need to wear a mask upon entering the hospital. If you do not have a mask, a mask will be given to you at the Main Entrance upon arrival. For doctor visits, patients may have 1 support person age 8 or older with them. For treatment visits, patients can not have anyone with them due to social distancing guidelines and our immunocompromised population.

## 2024-06-12 NOTE — Progress Notes (Signed)
 [facility] [address] [place] [address]    Clinic Day:  06/12/2024  Referring physician: Tressie Fryer, MD  Patient Care Team: Tressie Fryer, MD as PCP - General (Internal Medicine) Paulett Boros, MD as Medical Oncologist (Medical Oncology)   ASSESSMENT & PLAN:   Assessment: 1.  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 -Flex sigmoidoscopy on 03/06/2019 showed normal sigmoid colon, descending colon and rectosigmoid junction. -CTAP on 10/12/2019 did not show any evidence of recurrent malignancy.  Stable hepatic hypodensities likely cysts. -Flexible sigmoidoscopy on 03/27/2020 shows proximal rectum, rectosigmoid colon and sigmoid colon distal descending colon within normal limits.  A few nonbleeding distal rectal angiodysplastic lesions.  Anal papillae were hypertrophic.  Physical exam today did not reveal any palpable inguinal lymph nodes. - Flexible sigmoidoscopy on 05/08/2021 with perianal skin tags, mid sigmoid colon normal, rectum, rectosigmoid colon, distal sigmoid colon normal. - Margaret Castaneda will have surveillance visits with DRE every 3 to 6 months for 5 years with inguinal lymph node palpation.  Sigmoidoscopy every 6 to 12 months for 3 years.   2.  Right breast lump: -CT scan showed incidental right breast lump.  Patient reports Margaret Castaneda is aware of this lump for 25 years.  A needle biopsy at that time was benign.  Serial mammograms did not show any growth.   3.  Papillary thyroid  cancer: -Total thyroidectomy on 01/04/2019 which showed at least 2 foci, both in the left lobe measuring 0.6 and 0.1 cm.  0.6 cm lesion is the classic form of papillary thyroid  carcinoma.  Incidentally found 0.1 cm nodule is follicular variant of papillary thyroid  carcinoma.  No lymph nodes were examined.  PT1AP NX.  No lymphovascular invasion.  Margins negative. -Margaret Castaneda is status post iodine-131 thyroid  remnant  ablation. -Margaret Castaneda is on Synthroid  50 mcg alternating with 75 mcg and follows up with Dr. Monte Antonio.    Plan: 1.  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 - Physical exam: No palpable inguinal lymphadenopathy. - Last sigmoidoscopy was in November 2023. - Margaret Castaneda has completed 5 years of surveillance in August 2024.  Labs from 06/12/2024: Normal LFTs and creatinine.  CBC grossly normal. - Margaret Castaneda will continue follow-up with GI for colonoscopy. - Margaret Castaneda can follow-up with her PMD.  I will be glad to see her on an as-needed basis.   2.  Neuropathy: - On and off numbness in the legs and feet is stable.   No orders of the defined types were placed in this encounter.     Nadeen Augusta Teague,acting as a Neurosurgeon for Paulett Boros, MD.,have documented all relevant documentation on the behalf of Paulett Boros, MD,as directed by  Paulett Boros, MD while in the presence of Paulett Boros, MD.  I, Paulett Boros MD, have reviewed the above documentation for accuracy and completeness, and I agree with the above.    Paulett Boros, MD   6/16/20253:38 PM  CHIEF COMPLAINT:   Diagnosis: squamous cell carcinoma of the anal canal    Cancer Staging  No matching staging information was found for the patient.    Prior Therapy: Concurrent chemoradiation with Xeloda  and mitomycin  from 07/05/2018 to 08/17/2018   Current Therapy:  surveillance    HISTORY OF PRESENT ILLNESS:   Oncology History  Primary cancer of anal canal (HCC)  06/02/2018 Initial Diagnosis   Primary cancer of  anal canal (HCC)   07/05/2018 - 08/01/2018 Chemotherapy   The patient had ondansetron  (ZOFRAN ) 8 mg in sodium chloride  0.9 % 50 mL IVPB, , Intravenous,  Once, 1 of 1 cycle Administration:  (07/05/2018),  (08/01/2018) mitoMYcin  (MUTAMYCIN ) chemo injection 15.5 mg, 10 mg/m2 = 15.5 mg, Intravenous,  Once, 1 of 1 cycle Administration: 15.5 mg  (07/05/2018), 15.5 mg (08/01/2018)  for chemotherapy treatment.       INTERVAL HISTORY:   Margaret Castaneda is a 68 y.o. female presenting to clinic today for follow up of squamous cell carcinoma of the anal canal. Margaret Castaneda was last seen by me on 06/14/23.  Today, Margaret Castaneda states that Margaret Castaneda is doing well overall. Her appetite level is at 100%. Her energy level is at 100%.  PAST MEDICAL HISTORY:   Past Medical History: Past Medical History:  Diagnosis Date   Cancer (HCC)    Anal Cancer, Thyroid  Cancer   History of kidney stones    Hypertension    Neuropathy    Rectal mass 05/18/2018    Surgical History: Past Surgical History:  Procedure Laterality Date   COLONOSCOPY N/A 05/24/2018   Procedure: COLONOSCOPY;  Surgeon: Ruby Corporal, MD;  [facility];  Service: Endoscopy;  Laterality: N/A;  7:30   FLEXIBLE SIGMOIDOSCOPY N/A 03/02/2019   Procedure: FLEXIBLE SIGMOIDOSCOPY;  Surgeon: Ruby Corporal, MD;  [facility];  Service: Endoscopy;  Laterality: N/A;  245   FLEXIBLE SIGMOIDOSCOPY N/A 03/27/2020   Procedure: FLEXIBLE SIGMOIDOSCOPY;  Surgeon: Ruby Corporal, MD;  [facility];  Service: Endoscopy;  Laterality: N/A;  1145   FLEXIBLE SIGMOIDOSCOPY N/A 05/08/2021   Procedure: FLEXIBLE SIGMOIDOSCOPY;  Surgeon: Ruby Corporal, MD;  [facility];  Service: Endoscopy;  Laterality: N/A;  730   FLEXIBLE SIGMOIDOSCOPY N/A 11/06/2022   Procedure: FLEXIBLE SIGMOIDOSCOPY;  Surgeon: Urban Garden, MD;  [facility];  Service: Gastroenterology;  Laterality: N/A;  730 ASA 2, pt knows to arrive at 6:30   reversal tubal ligation     SIGMOIDOSCOPY  01/2019   THYROIDECTOMY N/A 01/04/2019   Procedure: TOTAL THYROIDECTOMY;  Surgeon: Alanda Allegra, MD;  Location: [facility];  Service: General;  Laterality: N/A;   THYROIDECTOMY     TONSILLECTOMY     TUBAL LIGATION      Social History: Social History   Socioeconomic History   Marital status: Married     Spouse name: Not on file   Number of children: Not on file   Years of education: Not on file   Highest education level: Not on file  Occupational History   Not on file  Tobacco Use   Smoking status: Never    Passive exposure: Past   Smokeless tobacco: Never  Vaping Use   Vaping status: Never Used  Substance and Sexual Activity   Alcohol use: Never   Drug use: Never   Sexual activity: Yes    Birth control/protection: Surgical  Other Topics Concern   Not on file  Social History Narrative   Not on file   Social Drivers of Health   Financial Resource Strain: Not on file  Food Insecurity: Not on file  Transportation Needs: Not on file  Physical Activity: Not on file  Stress: Not on file  Social Connections: Not on file  Intimate Partner Violence: Not on file    Family History: Family History  Problem Relation Age of Onset   Gastric cancer Paternal Grandfather    Hypertension Mother  Kidney cancer Father    Stroke Father    Heart attack Father    Thyroid  cancer Sister    Hypertension Brother    Breast cancer Maternal Grandmother    Colon cancer Neg Hx     Current Medications:  Current Outpatient Medications:    acetaminophen  (TYLENOL ) 500 MG tablet, Take 1 tablet (500 mg total) by mouth every 6 (six) hours as needed for moderate pain or headache., Disp: 30 tablet, Rfl: 0   amLODipine  (NORVASC ) 5 MG tablet, Take 5 mg by mouth. Take 1 tablet Monday, Wednesday and Friday. Take 1/2 tablet on Tuesday, Thursdays, Saturday and Sunday, Disp: , Rfl:    benazepril  (LOTENSIN ) 10 MG tablet, Take 10 mg by mouth in the morning., Disp: , Rfl:    diphenhydrAMINE  (BENADRYL ) 25 MG tablet, Take 25 mg by mouth every 6 (six) hours as needed for allergies. , Disp: , Rfl:    famotidine  (PEPCID ) 20 MG tablet, Take 20 mg by mouth daily., Disp: , Rfl:    ibuprofen (ADVIL) 200 MG tablet, Take 200-400 mg by mouth every 6 (six) hours as needed for moderate pain., Disp: , Rfl:    levothyroxine   (SYNTHROID ) 75 MCG tablet, Take 1 tablet (75 mcg total) by mouth daily before breakfast., Disp: 90 tablet, Rfl: 1   Allergies: No Known Allergies  REVIEW OF SYSTEMS:   Review of Systems  Constitutional:  Negative for chills, fatigue and fever.  HENT:   Negative for lump/mass, mouth sores, nosebleeds, sore throat and trouble swallowing.   Eyes:  Negative for eye problems.  Respiratory:  Negative for cough and shortness of breath.   Cardiovascular:  Negative for chest pain, leg swelling and palpitations.  Gastrointestinal:  Negative for abdominal pain, constipation, diarrhea, nausea and vomiting.  Genitourinary:  Negative for bladder incontinence, difficulty urinating, dysuria, frequency, hematuria and nocturia.   Musculoskeletal:  Negative for arthralgias, back pain, flank pain, myalgias and neck pain.  Skin:  Negative for itching and rash.  Neurological:  Negative for dizziness, headaches and numbness.  Hematological:  Does not bruise/bleed easily.  Psychiatric/Behavioral:  Negative for depression, sleep disturbance and suicidal ideas. The patient is not nervous/anxious.   All other systems reviewed and are negative.    VITALS:   Blood pressure (!) 144/74, pulse 70, temperature (!) 97.5 F (36.4 C), temperature source Oral, resp. rate 16, weight 120 lb 9.5 oz (54.7 kg), SpO2 100%.  Wt Readings from Last 3 Encounters:  06/12/24 120 lb 9.5 oz (54.7 kg)  06/05/24 121 lb 3.2 oz (55 kg)  01/26/24 121 lb (54.9 kg)    Body mass index is 22.79 kg/m.  Performance status (ECOG): 1 - Symptomatic but completely ambulatory  PHYSICAL EXAM:   Physical Exam Vitals and nursing note reviewed. Exam conducted with a chaperone present.  Constitutional:      Appearance: Normal appearance.   Cardiovascular:     Rate and Rhythm: Normal rate and regular rhythm.     Pulses: Normal pulses.     Heart sounds: Normal heart sounds.  Pulmonary:     Effort: Pulmonary effort is normal.     Breath  sounds: Normal breath sounds.  Abdominal:     Palpations: Abdomen is soft. There is no hepatomegaly, splenomegaly or mass.     Tenderness: There is no abdominal tenderness.   Musculoskeletal:     Right lower leg: No edema.     Left lower leg: No edema.  Lymphadenopathy:     Cervical: No cervical  adenopathy.     Right cervical: No superficial, deep or posterior cervical adenopathy.    Left cervical: No superficial, deep or posterior cervical adenopathy.     Upper Body:     Right upper body: No supraclavicular or axillary adenopathy.     Left upper body: No supraclavicular or axillary adenopathy.   Neurological:     General: No focal deficit present.     Mental Status: Margaret Castaneda is alert and oriented to person, place, and time.   Psychiatric:        Mood and Affect: Mood normal.        Behavior: Behavior normal.     LABS:      Latest Ref Rng & Units 06/12/2024    1:20 PM 06/14/2023    9:54 AM 12/07/2022   10:01 AM  CBC  WBC 4.0 - 10.5 K/uL 6.3  4.6  4.4   Hemoglobin 12.0 - 15.0 g/dL 40.9  81.1  91.4   Hematocrit 36.0 - 46.0 % 37.0  38.0  36.1   Platelets 150 - 400 K/uL 280  274  252       Latest Ref Rng & Units 06/12/2024    1:20 PM 06/14/2023    9:54 AM 12/07/2022   10:01 AM  CMP  Glucose 70 - 99 mg/dL 782  956  97   BUN 8 - 23 mg/dL 13  18  18    Creatinine 0.44 - 1.00 mg/dL 2.13  0.86  5.78   Sodium 135 - 145 mmol/L 141  139  140   Potassium 3.5 - 5.1 mmol/L 3.8  4.0  4.1   Chloride 98 - 111 mmol/L 105  100  104   CO2 22 - 32 mmol/L 28  30  30    Calcium  8.9 - 10.3 mg/dL 8.8  9.2  9.1   Total Protein 6.5 - 8.1 g/dL 6.6  6.8  6.5   Total Bilirubin 0.0 - 1.2 mg/dL 0.7  0.7  0.7   Alkaline Phos 38 - 126 U/L 119  77  70   AST 15 - 41 U/L 21  20  20    ALT 0 - 44 U/L 13  13  14       No results found for: CEA1, CEA / No results found for: CEA1, CEA No results found for: PSA1 No results found for: ION629 No results found for: BMW413  No results found for:  TOTALPROTELP, ALBUMINELP, A1GS, A2GS, BETS, BETA2SER, GAMS, MSPIKE, SPEI No results found for: TIBC, FERRITIN, IRONPCTSAT No results found for: LDH   STUDIES:   No results found.

## 2024-06-18 ENCOUNTER — Other Ambulatory Visit: Payer: Self-pay | Admitting: "Endocrinology

## 2024-09-13 ENCOUNTER — Other Ambulatory Visit: Payer: Self-pay | Admitting: "Endocrinology

## 2024-10-11 ENCOUNTER — Encounter (INDEPENDENT_AMBULATORY_CARE_PROVIDER_SITE_OTHER): Payer: Self-pay | Admitting: Gastroenterology

## 2024-11-16 LAB — LAB REPORT - SCANNED
Free T4: 1.49 ng/dL
TSH: 0.13 — AB (ref 0.41–5.90)

## 2024-11-27 ENCOUNTER — Encounter: Payer: Self-pay | Admitting: "Endocrinology

## 2024-11-27 ENCOUNTER — Ambulatory Visit (INDEPENDENT_AMBULATORY_CARE_PROVIDER_SITE_OTHER): Admitting: "Endocrinology

## 2024-11-27 VITALS — BP 128/76 | HR 76 | Ht 61.0 in | Wt 120.2 lb

## 2024-11-27 DIAGNOSIS — E890 Postprocedural hypothyroidism: Secondary | ICD-10-CM | POA: Diagnosis not present

## 2024-11-27 DIAGNOSIS — C73 Malignant neoplasm of thyroid gland: Secondary | ICD-10-CM | POA: Diagnosis not present

## 2024-11-27 DIAGNOSIS — I10 Essential (primary) hypertension: Secondary | ICD-10-CM | POA: Insufficient documentation

## 2024-11-27 MED ORDER — LEVOTHYROXINE SODIUM 75 MCG PO TABS: 75.0000 ug | ORAL_TABLET | Freq: Every day | ORAL | 1 refills | Status: DC

## 2024-11-27 NOTE — Progress Notes (Signed)
 11/27/2024, 12:17 PM         Endocrinology follow-up note   Subjective:    Patient ID: Margaret Castaneda, female    DOB: Jun 23, 1956, PCP Meade Bigness, MD   Past Medical History:  Diagnosis Date   Cancer [facility])    Anal Cancer, Thyroid  Cancer   History of kidney stones    Hypertension    Neuropathy    Rectal mass 05/18/2018   Past Surgical History:  Procedure Laterality Date   COLONOSCOPY N/A 05/24/2018   Procedure: COLONOSCOPY;  Surgeon: Golda Claudis PENNER, MD;  [facility];  Service: Endoscopy;  Laterality: N/A;  7:30   FLEXIBLE SIGMOIDOSCOPY N/A 03/02/2019   Procedure: FLEXIBLE SIGMOIDOSCOPY;  Surgeon: Golda Claudis PENNER, MD;  [facility];  Service: Endoscopy;  Laterality: N/A;  245   FLEXIBLE SIGMOIDOSCOPY N/A 03/27/2020   Procedure: FLEXIBLE SIGMOIDOSCOPY;  Surgeon: Golda Claudis PENNER, MD;  [facility];  Service: Endoscopy;  Laterality: N/A;  1145   FLEXIBLE SIGMOIDOSCOPY N/A 05/08/2021   Procedure: FLEXIBLE SIGMOIDOSCOPY;  Surgeon: Golda Claudis PENNER, MD;  [facility];  Service: Endoscopy;  Laterality: N/A;  730   FLEXIBLE SIGMOIDOSCOPY N/A 11/06/2022   Procedure: FLEXIBLE SIGMOIDOSCOPY;  Surgeon: Eartha Angelia Sieving, MD;  [facility];  Service: Gastroenterology;  Laterality: N/A;  730 ASA 2, pt knows to arrive at 6:30   reversal tubal ligation     SIGMOIDOSCOPY  01/2019   THYROIDECTOMY N/A 01/04/2019   Procedure: TOTAL THYROIDECTOMY;  Surgeon: Mavis Anes, MD;  Location: [facility];  Service: General;  Laterality: N/A;   THYROIDECTOMY     TONSILLECTOMY     TUBAL LIGATION     Social History   Socioeconomic History   Marital status: Married    Spouse name: Not on file   Number of children: Not on file   Years of education: Not on file   Highest education level: Not on file  Occupational History   Not on file  Tobacco Use   Smoking status: Never    Passive  exposure: Past   Smokeless tobacco: Never  Vaping Use   Vaping status: Never Used  Substance and Sexual Activity   Alcohol use: Never   Drug use: Never   Sexual activity: Yes    Birth control/protection: Surgical  Other Topics Concern   Not on file  Social History Narrative   Not on file   Social Drivers of Health   Financial Resource Strain: Not on file  Food Insecurity: Not on file  Transportation Needs: Not on file  Physical Activity: Not on file  Stress: Not on file  Social Connections: Not on file   Outpatient Encounter Medications as of 11/27/2024  Medication Sig   amLODipine  (NORVASC ) 5 MG tablet Take 5 mg by mouth. Take 1 tablet Monday, Wednesday and Friday. Take 1/2 tablet on Tuesday, Thursdays, Saturday and Sunday (Patient taking differently: Take 5 mg by mouth daily. Take 1 tablet Monday, Wednesday and Friday. Take 1/2 tablet on Tuesday, Thursdays, Saturday and Sunday)   benazepril  (LOTENSIN ) 10 MG tablet Take 10 mg by mouth in the morning. (Patient taking differently: Take 20 mg by mouth in the morning.)  acetaminophen  (TYLENOL ) 500 MG tablet Take 1 tablet (500 mg total) by mouth every 6 (six) hours as needed for moderate pain or headache.   diphenhydrAMINE  (BENADRYL ) 25 MG tablet Take 25 mg by mouth every 6 (six) hours as needed for allergies.    famotidine  (PEPCID ) 20 MG tablet Take 20 mg by mouth daily.   ibuprofen (ADVIL) 200 MG tablet Take 200-400 mg by mouth every 6 (six) hours as needed for moderate pain.   levothyroxine  (SYNTHROID ) 75 MCG tablet Take 1 tablet (75 mcg total) by mouth daily before breakfast.   [DISCONTINUED] levothyroxine  (SYNTHROID ) 75 MCG tablet TAKE 1 TABLET BY MOUTH ONCE DAILY BEFORE BREAKFAST   No facility-administered encounter medications on file as of 11/27/2024.   ALLERGIES: No Known Allergies  VACCINATION STATUS: Immunization History  Administered Date(s) Administered   Influenza-Unspecified 11/01/2020   Moderna Sars-Covid-2  Vaccination 01/11/2020, 02/09/2020, 11/01/2020    HPI Margaret Castaneda is 68 y.o. female who is engaged in telephone visit for follow-up of postsurgical hypothyroidism after she underwent total thyroidectomy for thyroid  malignancy prior to her last visit.  -She has no new complaints today. She is currently on Synthroid  75 mcg p.o. daily before breakfast.   She is tolerating this medication, previsit thyroid  function tests are consistent with appropriate replacement.    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  Subsequent biopsy of 0.7 cm nodule in the left lobe of the thyroid  in November 2019 revealed papillary thyroid  cancer.  On January 04, 2019 she underwent total thyroidectomy which revealed 2 foci of malignancy in the right lobe of her thyroid : 0.6 cm papillary thyroid  cancer and 0.1 cm follicular variant papillary thyroid  cancer.   Her most recent Thyrogen  stimulated whole-body scan was negative for iodine avid recurrent thyroid  cancer or distant metastasis on January 11, 2023.    Her previsit labs show undetectable thyroglobulin and thyroglobulin antibodies. She denies any new complaints today.  Her previsit thyroid  function tests are consistent with appropriate suppressive replacement. She reports compliance with her medications.  She feels better, has no new complaints today.    -She denies any prior exposure to neck radiation, has 1 sister with thyroid  malignancy. -She denies palpitations, heat intolerance, tremors.    Objective:    BP 128/76   Pulse 76   Ht 5' 1 (1.549 m)   Wt 120 lb 3.2 oz (54.5 kg)   BMI 22.71 kg/m   Wt Readings from Last 3 Encounters:  11/27/24 120 lb 3.2 oz (54.5 kg)  06/12/24 120 lb 9.5 oz (54.7 kg)  06/05/24 121 lb 3.2 oz (55 kg)    Physical Exam   CMP     Component Value Date/Time   NA 141 06/12/2024 1320   K 3.8  06/12/2024 1320   CL 105 06/12/2024 1320   CO2 28 06/12/2024 1320   GLUCOSE 121 (H) 06/12/2024 1320   BUN 13 06/12/2024 1320   CREATININE 0.98 06/12/2024 1320   CREATININE 0.95 05/18/2018 1416   CALCIUM  8.8 (L) 06/12/2024 1320   PROT 6.6 06/12/2024 1320   ALBUMIN 3.7 06/12/2024 1320   AST 21 06/12/2024 1320   ALT 13 06/12/2024 1320   ALKPHOS 119 06/12/2024 1320   BILITOT 0.7 06/12/2024 1320   GFRNONAA >60 06/12/2024 1320   GFRAA >60 05/15/2020 1414    Lab Results  Component Value Date   TSH 0.13 (  A) 11/16/2024   TSH 0.42 05/30/2024   TSH 1.01 01/11/2024   TSH 306.854 (H) 01/08/2023   TSH 0.516 06/08/2022   TSH 2.55 11/17/2021   TSH 2.404 05/14/2021   TSH 3.57 02/17/2021   TSH 330.657 (H) 12/13/2020   TSH 1.62 08/07/2020   FREET4 1.49 11/16/2024   FREET4 1.51 05/30/2024   FREET4 0.95 01/08/2023   FREET4 1.04 05/14/2021   FREET4 1.10 12/13/2020   FREET4 1.46 03/06/2019   FREET4 1.47 02/24/2019     Thyroid /neck ultrasound December 27, 2019 No regional cervical lymphadenopathy.   IMPRESSION: Post total thyroidectomy without evidence of residual or locally recurrent disease.    Most recent thyroid  instability whole-body scan on December 16, 2020 IMPRESSION: Small focus of radio iodine accumulation in the RIGHT cervical region.   This is faint and nonspecific; this could represent residual tracer within the RIGHT thyroid  bed, subtle nodal disease, or mild physiologic accumulation of tracer in the esophagus from salivary origin.  Recommend thyroid  ultrasound and correlation with thyroglobulin Levels.  Recommended thyroid /neck ultrasound on May 14, 2021: No regional cervical lymphadenopathy.   IMPRESSION: Post total thyroidectomy without evidence of residual or locally recurrent disease.   Thyrogen  stimulated whole-body scan on January 11, 2023 FINDINGS: No radiotracer activity in the thyroid  bed. No evidence of abnormal radiotracer accumulation on  whole-body I 131 scan.   Physiologic activity noted in the GI and GU tract.   IMPRESSION: No evidence of local thyroid  cancer recurrence or distant metastasis.    Assessment & Plan:   1. Postsurgical hypothyroidism 2. Papillary carcinoma of thyroid  (HCC) 3.  Hypertension -Her previsit thyroid  function tests are consistent with appropriate suppressive treatment.  She is advised to continue levothyroxine  75 mcg p.o. daily before breakfast.     - We discussed about the correct intake of her thyroid  hormone, on empty stomach at fasting, with water , separated by at least 30 minutes from breakfast and other medications,  and separated by more than 4 hours from calcium , iron, multivitamins, acid reflux medications (PPIs). -Patient is made aware of the fact that thyroid  hormone replacement is needed for life, dose to be adjusted by periodic monitoring of thyroid  function tests.  -She is status post I-131 thyroid  remnant ablation with negative post therapy whole-body scan for distant metastasis.  --Her subsequent thyroid /neck ultrasound studies did not show evidence of thyroid  remnant or tumor recurrence.  Her most recent whole-body scan is negative for iodine avid recurrent thyroid  cancer or distant metastasis.   Her previsit labs show undetectable thyroglobulin and thyroglobulin antibodies.     -She presents with significant improvement in her blood pressure measuring 128 over 76 mmHg.  She is advised to continue Norvasc  5 mg p.o. daily, benazepril  10 mg p.o. daily.    - I advised her  to maintain close follow up with Meade Bigness, MD for primary care needs.   I spent  25  minutes in the care of the patient today including review of labs from Thyroid  Function, CMP, and other relevant labs ; imaging/biopsy records (current and previous including abstractions from other facilities); face-to-face time discussing  her lab results and symptoms, medications doses, her options of short and long  term treatment based on the latest standards of care / guidelines;   and documenting the encounter.  Margaret Castaneda  participated in the discussions, expressed understanding, and voiced agreement with the above plans.  All questions were answered to her satisfaction. she is encouraged to contact clinic should she  have any questions or concerns prior to her return visit.    Follow up plan: Return in about 6 months (around 05/28/2025) for F/U with Pre-visit Labs.   Ranny Earl, MD [facility] [address] [place], [place] [address] Phone: [phone]  Fax: [phone]     11/27/2024, 12:17 PM  This note was partially dictated with voice recognition software. Similar sounding words can be transcribed inadequately or may not  be corrected upon review.

## 2024-12-04 ENCOUNTER — Ambulatory Visit: Admitting: "Endocrinology

## 2024-12-13 ENCOUNTER — Other Ambulatory Visit: Payer: Self-pay | Admitting: "Endocrinology

## 2025-05-28 ENCOUNTER — Ambulatory Visit: Admitting: "Endocrinology
